# Patient Record
Sex: Male | Born: 1940 | Race: White | Hispanic: No | Marital: Married | Smoking: Never smoker
Health system: Southern US, Community
[De-identification: ages and names within clinical notes are randomized; demographics above are authoritative.]

## PROBLEM LIST (undated history)

## (undated) DIAGNOSIS — E119 Type 2 diabetes mellitus without complications: Secondary | ICD-10-CM

## (undated) DIAGNOSIS — R410 Disorientation, unspecified: Secondary | ICD-10-CM

## (undated) DIAGNOSIS — E782 Mixed hyperlipidemia: Secondary | ICD-10-CM

## (undated) DIAGNOSIS — I219 Acute myocardial infarction, unspecified: Secondary | ICD-10-CM

## (undated) DIAGNOSIS — Z79899 Other long term (current) drug therapy: Secondary | ICD-10-CM

## (undated) DIAGNOSIS — I10 Essential (primary) hypertension: Secondary | ICD-10-CM

## (undated) DIAGNOSIS — R6521 Severe sepsis with septic shock: Secondary | ICD-10-CM

## (undated) DIAGNOSIS — A419 Sepsis, unspecified organism: Secondary | ICD-10-CM

## (undated) DIAGNOSIS — I482 Chronic atrial fibrillation, unspecified: Secondary | ICD-10-CM

## (undated) DIAGNOSIS — I2510 Atherosclerotic heart disease of native coronary artery without angina pectoris: Secondary | ICD-10-CM

## (undated) DIAGNOSIS — E870 Hyperosmolality and hypernatremia: Secondary | ICD-10-CM

## (undated) HISTORY — DX: Acute myocardial infarction, unspecified: I21.9

## (undated) HISTORY — DX: Type 2 diabetes mellitus without complications: E11.9

## (undated) HISTORY — DX: Sepsis, unspecified organism: A41.9

## (undated) HISTORY — DX: Disorientation, unspecified: R41.0

## (undated) HISTORY — DX: Mixed hyperlipidemia: E78.2

## (undated) HISTORY — DX: Chronic atrial fibrillation, unspecified: I48.20

## (undated) HISTORY — DX: Other long term (current) drug therapy: Z79.899

## (undated) HISTORY — DX: Hyperosmolality and hypernatremia: E87.0

## (undated) HISTORY — DX: Severe sepsis with septic shock: R65.21

## (undated) HISTORY — DX: Atherosclerotic heart disease of native coronary artery without angina pectoris: I25.10

## (undated) HISTORY — DX: Essential (primary) hypertension: I10

---

## 1992-01-26 HISTORY — PX: CORONARY ARTERY BYPASS GRAFT: SHX141

## 2000-06-15 ENCOUNTER — Ambulatory Visit (HOSPITAL_COMMUNITY): Admission: RE | Admit: 2000-06-15 | Discharge: 2000-06-16 | Payer: Self-pay | Admitting: *Deleted

## 2000-06-15 ENCOUNTER — Encounter: Payer: Self-pay | Admitting: *Deleted

## 2001-09-13 ENCOUNTER — Encounter: Admission: RE | Admit: 2001-09-13 | Discharge: 2001-09-13 | Payer: Self-pay | Admitting: Infectious Diseases

## 2001-10-04 ENCOUNTER — Encounter: Admission: RE | Admit: 2001-10-04 | Discharge: 2001-10-04 | Payer: Self-pay | Admitting: Infectious Diseases

## 2003-07-26 ENCOUNTER — Inpatient Hospital Stay (HOSPITAL_COMMUNITY): Admission: EM | Admit: 2003-07-26 | Discharge: 2003-07-30 | Payer: Self-pay | Admitting: Emergency Medicine

## 2003-07-30 ENCOUNTER — Encounter: Payer: Self-pay | Admitting: Cardiology

## 2003-08-08 ENCOUNTER — Ambulatory Visit (HOSPITAL_COMMUNITY): Admission: RE | Admit: 2003-08-08 | Discharge: 2003-08-08 | Payer: Self-pay | Admitting: Infectious Diseases

## 2003-12-03 ENCOUNTER — Ambulatory Visit: Payer: Self-pay | Admitting: Cardiology

## 2003-12-17 ENCOUNTER — Ambulatory Visit: Payer: Self-pay | Admitting: Cardiology

## 2004-01-07 ENCOUNTER — Ambulatory Visit: Payer: Self-pay | Admitting: Cardiology

## 2004-02-04 ENCOUNTER — Ambulatory Visit: Payer: Self-pay | Admitting: Cardiology

## 2004-03-03 ENCOUNTER — Ambulatory Visit: Payer: Self-pay | Admitting: Cardiology

## 2004-03-24 ENCOUNTER — Ambulatory Visit: Payer: Self-pay | Admitting: *Deleted

## 2004-04-21 ENCOUNTER — Ambulatory Visit: Payer: Self-pay | Admitting: Cardiology

## 2004-05-19 ENCOUNTER — Ambulatory Visit: Payer: Self-pay | Admitting: *Deleted

## 2004-05-21 ENCOUNTER — Ambulatory Visit: Payer: Self-pay | Admitting: Cardiology

## 2004-05-25 ENCOUNTER — Ambulatory Visit: Payer: Self-pay

## 2004-06-02 ENCOUNTER — Ambulatory Visit: Payer: Self-pay | Admitting: Cardiology

## 2004-06-03 ENCOUNTER — Ambulatory Visit: Payer: Self-pay | Admitting: *Deleted

## 2004-06-03 ENCOUNTER — Ambulatory Visit (HOSPITAL_COMMUNITY): Admission: RE | Admit: 2004-06-03 | Discharge: 2004-06-05 | Payer: Self-pay | Admitting: *Deleted

## 2004-06-09 ENCOUNTER — Ambulatory Visit: Payer: Self-pay | Admitting: Cardiology

## 2004-06-18 ENCOUNTER — Ambulatory Visit: Payer: Self-pay | Admitting: Cardiology

## 2004-06-26 ENCOUNTER — Ambulatory Visit: Payer: Self-pay | Admitting: Cardiology

## 2004-07-10 ENCOUNTER — Ambulatory Visit: Payer: Self-pay | Admitting: Cardiology

## 2004-07-24 ENCOUNTER — Ambulatory Visit: Payer: Self-pay | Admitting: Cardiology

## 2004-08-21 ENCOUNTER — Ambulatory Visit: Payer: Self-pay | Admitting: Cardiology

## 2004-09-18 ENCOUNTER — Ambulatory Visit: Payer: Self-pay | Admitting: Internal Medicine

## 2004-10-09 ENCOUNTER — Ambulatory Visit: Payer: Self-pay | Admitting: Internal Medicine

## 2004-11-06 ENCOUNTER — Ambulatory Visit: Payer: Self-pay | Admitting: Internal Medicine

## 2004-12-04 ENCOUNTER — Ambulatory Visit: Payer: Self-pay | Admitting: Cardiology

## 2004-12-16 ENCOUNTER — Ambulatory Visit: Payer: Self-pay | Admitting: Cardiology

## 2004-12-25 ENCOUNTER — Ambulatory Visit: Payer: Self-pay | Admitting: Internal Medicine

## 2004-12-25 ENCOUNTER — Ambulatory Visit: Payer: Self-pay

## 2005-01-15 ENCOUNTER — Ambulatory Visit: Payer: Self-pay | Admitting: Cardiology

## 2005-02-12 ENCOUNTER — Ambulatory Visit: Payer: Self-pay | Admitting: Cardiology

## 2005-03-05 ENCOUNTER — Ambulatory Visit: Payer: Self-pay | Admitting: Cardiovascular Disease

## 2005-03-26 ENCOUNTER — Ambulatory Visit: Payer: Self-pay | Admitting: Cardiology

## 2005-04-16 ENCOUNTER — Ambulatory Visit: Payer: Self-pay | Admitting: Cardiology

## 2005-05-14 ENCOUNTER — Ambulatory Visit: Payer: Self-pay | Admitting: Internal Medicine

## 2005-05-28 ENCOUNTER — Ambulatory Visit: Payer: Self-pay | Admitting: Cardiology

## 2005-06-18 ENCOUNTER — Ambulatory Visit: Payer: Self-pay | Admitting: Cardiology

## 2005-07-09 ENCOUNTER — Ambulatory Visit: Payer: Self-pay | Admitting: Cardiology

## 2005-07-19 ENCOUNTER — Ambulatory Visit: Payer: Self-pay | Admitting: Cardiology

## 2005-08-02 ENCOUNTER — Ambulatory Visit: Payer: Self-pay | Admitting: Cardiology

## 2005-08-23 ENCOUNTER — Ambulatory Visit: Payer: Self-pay | Admitting: Cardiology

## 2005-08-24 ENCOUNTER — Ambulatory Visit: Payer: Self-pay | Admitting: Cardiology

## 2005-08-31 ENCOUNTER — Ambulatory Visit: Payer: Self-pay | Admitting: Cardiology

## 2005-08-31 ENCOUNTER — Encounter: Payer: Self-pay | Admitting: Cardiology

## 2005-08-31 ENCOUNTER — Ambulatory Visit: Payer: Self-pay

## 2005-09-20 ENCOUNTER — Ambulatory Visit: Payer: Self-pay | Admitting: Cardiology

## 2005-10-06 ENCOUNTER — Ambulatory Visit: Payer: Self-pay | Admitting: Cardiovascular Disease

## 2005-10-22 ENCOUNTER — Ambulatory Visit: Payer: Self-pay | Admitting: Cardiology

## 2005-11-05 ENCOUNTER — Ambulatory Visit: Payer: Self-pay | Admitting: Cardiology

## 2005-11-26 ENCOUNTER — Ambulatory Visit: Payer: Self-pay | Admitting: Cardiology

## 2005-12-24 ENCOUNTER — Ambulatory Visit: Payer: Self-pay | Admitting: Cardiology

## 2006-01-10 ENCOUNTER — Ambulatory Visit: Payer: Self-pay | Admitting: Internal Medicine

## 2006-01-31 ENCOUNTER — Ambulatory Visit: Payer: Self-pay | Admitting: Cardiology

## 2006-02-28 ENCOUNTER — Ambulatory Visit: Payer: Self-pay | Admitting: Cardiology

## 2006-03-08 ENCOUNTER — Ambulatory Visit: Payer: Self-pay | Admitting: Cardiology

## 2006-03-22 ENCOUNTER — Ambulatory Visit: Payer: Self-pay | Admitting: Cardiology

## 2006-04-19 ENCOUNTER — Ambulatory Visit: Payer: Self-pay | Admitting: Cardiology

## 2006-05-17 ENCOUNTER — Ambulatory Visit: Payer: Self-pay | Admitting: Cardiology

## 2006-06-14 ENCOUNTER — Ambulatory Visit: Payer: Self-pay | Admitting: Cardiology

## 2006-07-05 ENCOUNTER — Ambulatory Visit: Payer: Self-pay | Admitting: Cardiology

## 2006-07-26 ENCOUNTER — Ambulatory Visit: Payer: Self-pay | Admitting: Cardiology

## 2006-08-23 ENCOUNTER — Ambulatory Visit: Payer: Self-pay | Admitting: Cardiology

## 2006-09-13 ENCOUNTER — Ambulatory Visit: Payer: Self-pay | Admitting: Cardiology

## 2006-10-11 ENCOUNTER — Ambulatory Visit: Payer: Self-pay | Admitting: Cardiology

## 2006-11-08 ENCOUNTER — Ambulatory Visit: Payer: Self-pay | Admitting: Cardiovascular Disease

## 2006-12-02 ENCOUNTER — Ambulatory Visit: Payer: Self-pay | Admitting: Cardiology

## 2006-12-30 ENCOUNTER — Ambulatory Visit: Payer: Self-pay | Admitting: Cardiology

## 2007-01-27 ENCOUNTER — Ambulatory Visit: Payer: Self-pay | Admitting: Cardiovascular Disease

## 2007-03-01 ENCOUNTER — Ambulatory Visit: Payer: Self-pay | Admitting: Internal Medicine

## 2007-03-01 ENCOUNTER — Ambulatory Visit: Payer: Self-pay | Admitting: Cardiology

## 2007-03-10 ENCOUNTER — Ambulatory Visit: Payer: Self-pay

## 2007-03-15 ENCOUNTER — Ambulatory Visit: Payer: Self-pay | Admitting: Internal Medicine

## 2007-04-05 ENCOUNTER — Ambulatory Visit: Payer: Self-pay | Admitting: Cardiology

## 2007-05-03 ENCOUNTER — Ambulatory Visit: Payer: Self-pay | Admitting: Internal Medicine

## 2007-05-17 ENCOUNTER — Ambulatory Visit: Payer: Self-pay | Admitting: Cardiology

## 2007-06-14 ENCOUNTER — Ambulatory Visit: Payer: Self-pay | Admitting: Cardiovascular Disease

## 2007-07-12 ENCOUNTER — Ambulatory Visit: Payer: Self-pay | Admitting: Cardiology

## 2007-08-09 ENCOUNTER — Ambulatory Visit: Payer: Self-pay | Admitting: Cardiovascular Disease

## 2007-09-06 ENCOUNTER — Ambulatory Visit: Payer: Self-pay | Admitting: Cardiovascular Disease

## 2007-10-04 ENCOUNTER — Ambulatory Visit: Payer: Self-pay | Admitting: Cardiovascular Disease

## 2007-10-25 ENCOUNTER — Ambulatory Visit: Payer: Self-pay | Admitting: Cardiovascular Disease

## 2007-11-08 ENCOUNTER — Ambulatory Visit: Payer: Self-pay | Admitting: Internal Medicine

## 2007-11-24 ENCOUNTER — Ambulatory Visit: Payer: Self-pay | Admitting: Cardiology

## 2007-12-15 ENCOUNTER — Ambulatory Visit: Payer: Self-pay | Admitting: Cardiology

## 2008-01-05 ENCOUNTER — Ambulatory Visit: Payer: Self-pay | Admitting: Cardiology

## 2008-02-02 ENCOUNTER — Ambulatory Visit: Payer: Self-pay | Admitting: Cardiology

## 2008-02-28 ENCOUNTER — Ambulatory Visit: Payer: Self-pay | Admitting: Cardiology

## 2008-03-08 ENCOUNTER — Ambulatory Visit: Payer: Self-pay | Admitting: Internal Medicine

## 2008-03-20 ENCOUNTER — Ambulatory Visit: Payer: Self-pay | Admitting: Cardiology

## 2008-03-20 ENCOUNTER — Encounter: Payer: Self-pay | Admitting: Cardiology

## 2008-03-29 ENCOUNTER — Ambulatory Visit: Payer: Self-pay | Admitting: Internal Medicine

## 2008-04-25 ENCOUNTER — Ambulatory Visit: Payer: Self-pay | Admitting: Internal Medicine

## 2008-05-23 ENCOUNTER — Ambulatory Visit: Payer: Self-pay | Admitting: Cardiology

## 2008-06-20 ENCOUNTER — Ambulatory Visit: Payer: Self-pay | Admitting: Internal Medicine

## 2008-06-20 LAB — CONVERTED CEMR LAB: POC INR: 2.5

## 2008-06-25 ENCOUNTER — Encounter: Payer: Self-pay | Admitting: *Deleted

## 2008-07-18 ENCOUNTER — Ambulatory Visit: Payer: Self-pay | Admitting: Cardiology

## 2008-07-31 ENCOUNTER — Encounter: Payer: Self-pay | Admitting: *Deleted

## 2008-08-15 ENCOUNTER — Ambulatory Visit: Payer: Self-pay | Admitting: Cardiology

## 2008-08-15 LAB — CONVERTED CEMR LAB: Prothrombin Time: 16.4 s

## 2008-09-03 ENCOUNTER — Encounter: Payer: Self-pay | Admitting: Cardiology

## 2008-09-12 ENCOUNTER — Ambulatory Visit: Payer: Self-pay | Admitting: Cardiology

## 2008-10-10 ENCOUNTER — Ambulatory Visit: Payer: Self-pay | Admitting: Cardiology

## 2008-10-10 LAB — CONVERTED CEMR LAB: POC INR: 3.3

## 2008-10-31 ENCOUNTER — Ambulatory Visit: Payer: Self-pay | Admitting: Cardiology

## 2008-11-14 ENCOUNTER — Ambulatory Visit: Payer: Self-pay | Admitting: Cardiology

## 2008-11-14 LAB — CONVERTED CEMR LAB: POC INR: 2.6

## 2008-12-05 ENCOUNTER — Ambulatory Visit: Payer: Self-pay | Admitting: Cardiovascular Disease

## 2008-12-27 ENCOUNTER — Encounter (INDEPENDENT_AMBULATORY_CARE_PROVIDER_SITE_OTHER): Payer: Self-pay | Admitting: *Deleted

## 2009-01-02 ENCOUNTER — Ambulatory Visit: Payer: Self-pay | Admitting: Cardiology

## 2009-01-02 ENCOUNTER — Encounter (INDEPENDENT_AMBULATORY_CARE_PROVIDER_SITE_OTHER): Payer: Self-pay | Admitting: Cardiology

## 2009-01-02 LAB — CONVERTED CEMR LAB

## 2009-01-30 ENCOUNTER — Ambulatory Visit: Payer: Self-pay | Admitting: Internal Medicine

## 2009-02-27 ENCOUNTER — Ambulatory Visit: Payer: Self-pay | Admitting: Internal Medicine

## 2009-02-27 LAB — CONVERTED CEMR LAB: POC INR: 2.5

## 2009-03-20 ENCOUNTER — Encounter: Payer: Self-pay | Admitting: Cardiology

## 2009-03-21 DIAGNOSIS — I4891 Unspecified atrial fibrillation: Secondary | ICD-10-CM

## 2009-03-21 DIAGNOSIS — E785 Hyperlipidemia, unspecified: Secondary | ICD-10-CM

## 2009-03-21 DIAGNOSIS — I10 Essential (primary) hypertension: Secondary | ICD-10-CM

## 2009-03-21 DIAGNOSIS — E119 Type 2 diabetes mellitus without complications: Secondary | ICD-10-CM

## 2009-03-28 ENCOUNTER — Ambulatory Visit: Payer: Self-pay | Admitting: Cardiology

## 2009-03-28 ENCOUNTER — Encounter (INDEPENDENT_AMBULATORY_CARE_PROVIDER_SITE_OTHER): Payer: Self-pay | Admitting: Cardiology

## 2009-03-28 LAB — CONVERTED CEMR LAB: POC INR: 2

## 2009-04-25 ENCOUNTER — Ambulatory Visit: Payer: Self-pay | Admitting: Internal Medicine

## 2009-04-25 LAB — CONVERTED CEMR LAB: POC INR: 2

## 2009-05-22 ENCOUNTER — Ambulatory Visit: Payer: Self-pay | Admitting: Cardiology

## 2009-05-22 LAB — CONVERTED CEMR LAB: POC INR: 2.6

## 2009-06-19 ENCOUNTER — Ambulatory Visit: Payer: Self-pay | Admitting: Internal Medicine

## 2009-06-19 LAB — CONVERTED CEMR LAB: POC INR: 2.4

## 2009-07-17 ENCOUNTER — Ambulatory Visit: Payer: Self-pay | Admitting: Cardiology

## 2009-07-29 ENCOUNTER — Ambulatory Visit: Payer: Self-pay | Admitting: Internal Medicine

## 2009-07-31 ENCOUNTER — Inpatient Hospital Stay (HOSPITAL_COMMUNITY): Admission: EM | Admit: 2009-07-31 | Discharge: 2009-09-01 | Payer: Self-pay | Admitting: Emergency Medicine

## 2009-07-31 ENCOUNTER — Ambulatory Visit: Payer: Self-pay | Admitting: Internal Medicine

## 2009-07-31 ENCOUNTER — Ambulatory Visit: Payer: Self-pay | Admitting: Cardiology

## 2009-08-01 ENCOUNTER — Encounter: Payer: Self-pay | Admitting: Cardiology

## 2009-08-19 ENCOUNTER — Ambulatory Visit: Payer: Self-pay | Admitting: Physical Medicine & Rehabilitation

## 2009-09-05 ENCOUNTER — Encounter: Payer: Self-pay | Admitting: Internal Medicine

## 2009-09-12 DIAGNOSIS — R578 Other shock: Secondary | ICD-10-CM | POA: Insufficient documentation

## 2009-09-12 DIAGNOSIS — E870 Hyperosmolality and hypernatremia: Secondary | ICD-10-CM

## 2009-09-12 DIAGNOSIS — I252 Old myocardial infarction: Secondary | ICD-10-CM

## 2009-09-12 DIAGNOSIS — R404 Transient alteration of awareness: Secondary | ICD-10-CM

## 2009-09-23 ENCOUNTER — Ambulatory Visit: Payer: Self-pay | Admitting: Cardiology

## 2009-09-24 ENCOUNTER — Encounter: Payer: Self-pay | Admitting: Cardiology

## 2009-09-24 ENCOUNTER — Telehealth (INDEPENDENT_AMBULATORY_CARE_PROVIDER_SITE_OTHER): Payer: Self-pay | Admitting: *Deleted

## 2009-09-25 ENCOUNTER — Encounter: Payer: Self-pay | Admitting: Cardiology

## 2009-09-25 ENCOUNTER — Encounter (HOSPITAL_COMMUNITY): Admission: RE | Admit: 2009-09-25 | Discharge: 2009-10-22 | Payer: Self-pay | Admitting: Cardiology

## 2009-09-25 ENCOUNTER — Ambulatory Visit: Payer: Self-pay | Admitting: Cardiology

## 2009-09-25 ENCOUNTER — Ambulatory Visit: Payer: Self-pay

## 2009-09-26 ENCOUNTER — Encounter: Payer: Self-pay | Admitting: Cardiology

## 2009-10-31 ENCOUNTER — Encounter: Payer: Self-pay | Admitting: Cardiology

## 2009-11-04 ENCOUNTER — Encounter: Admission: RE | Admit: 2009-11-04 | Discharge: 2009-11-04 | Payer: Self-pay | Admitting: Surgery

## 2009-11-06 ENCOUNTER — Ambulatory Visit: Payer: Self-pay | Admitting: Cardiology

## 2009-12-03 ENCOUNTER — Telehealth (INDEPENDENT_AMBULATORY_CARE_PROVIDER_SITE_OTHER): Payer: Self-pay | Admitting: *Deleted

## 2009-12-05 ENCOUNTER — Ambulatory Visit (HOSPITAL_COMMUNITY): Admission: RE | Admit: 2009-12-05 | Discharge: 2009-12-06 | Payer: Self-pay | Admitting: Surgery

## 2009-12-05 ENCOUNTER — Encounter (INDEPENDENT_AMBULATORY_CARE_PROVIDER_SITE_OTHER): Payer: Self-pay | Admitting: Surgery

## 2009-12-12 ENCOUNTER — Ambulatory Visit: Payer: Self-pay | Admitting: Cardiology

## 2009-12-19 ENCOUNTER — Ambulatory Visit: Payer: Self-pay | Admitting: Cardiology

## 2009-12-19 LAB — CONVERTED CEMR LAB: POC INR: 2.9

## 2009-12-26 ENCOUNTER — Encounter: Payer: Self-pay | Admitting: Cardiology

## 2010-01-09 ENCOUNTER — Ambulatory Visit: Payer: Self-pay | Admitting: Internal Medicine

## 2010-02-09 ENCOUNTER — Encounter: Payer: Self-pay | Admitting: Cardiology

## 2010-02-09 ENCOUNTER — Ambulatory Visit: Admission: RE | Admit: 2010-02-09 | Discharge: 2010-02-09 | Payer: Self-pay | Source: Home / Self Care

## 2010-02-09 ENCOUNTER — Ambulatory Visit
Admission: RE | Admit: 2010-02-09 | Discharge: 2010-02-09 | Payer: Self-pay | Source: Home / Self Care | Attending: Cardiology | Admitting: Cardiology

## 2010-02-09 DIAGNOSIS — R609 Edema, unspecified: Secondary | ICD-10-CM | POA: Insufficient documentation

## 2010-02-26 NOTE — Medication Information (Signed)
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 Cloyde Reams RN  September 05, 2009 10:30 AM   Allergies: 1)  ! Lipitor  Anticoagulation Management History:      Positive risk factors for bleeding include an age of 72 years or older and presence of serious comorbidities.  The bleeding index is 'intermediate risk'.  Positive CHADS2 values include History of HTN and History of Diabetes.  Negative CHADS2 values include Age > 48 years old.  The start date was 05/28/1997.  Anticoagulation responsible provider: Tenny Craw MD, Gunnar Fusi.  Exp: 09/2010.    Anticoagulation Management Assessment/Plan:      The patient's current anticoagulation dose is Warfarin sodium 5 mg tabs: Use as directed by Anticoagulation Clinic, Warfarin sodium 7.5 mg tabs: Use as directed by Anticoagulation Clinic.  The target INR is 2 - 3.  The next INR is due 08/14/2009.  Anticoagulation instructions were given to patient.  Results were reviewed/authorized by Inactive.         Prior Anticoagulation Instructions: INR 2.4  Continue on same dosage 7.5mg  daily except 5mg  on Wednesdays.  Recheck in 4 weeks.

## 2010-02-26 NOTE — Letter (Signed)
Summary: [facility]   Imported By: Marylou Mccoy 10/24/2009 10:53:35  _____________________________________________________________________  External Attachment:    Type:   Image     Comment:   External Document

## 2010-02-26 NOTE — Miscellaneous (Signed)
  Clinical Lists Changes  Medications: Removed medication of WARFARIN SODIUM 1 MG TABS (WARFARIN SODIUM) Use as directed by Anticoagualtion Clinic Changed medication from COUMADIN 5 MG TABS (WARFARIN SODIUM) Sunday - none, Monday - none, Tuesday - none, Wednesday - 1 tab, Thursday - none, Friday - none, Saturday - none to WARFARIN SODIUM 5 MG TABS (WARFARIN SODIUM) Use as directed by Anticoagulation Clinic Changed medication from COUMADIN 7.5 MG TABS (WARFARIN SODIUM) Sunday - 1 tab, Monday - 1 tab, Tuesday - 1 tab, Wednesday - none, Thursday - 1 tab, Friday - 1 tab, Saturday - 1 tab to WARFARIN SODIUM 7.5 MG TABS (WARFARIN SODIUM) Use as directed by Anticoagulation Clinic

## 2010-02-26 NOTE — Letter (Signed)
Summary: Clearance Letter  [facility], Main Office  [address]   [place], [place] [address]   Phone: [phone]  Fax: [phone]    September 26, 2009  Re:     Jordan Clayton Address:   [address]     [place], [place]  [address] DOB:     1940/08/19 MRN:     [id number]   Dear Dr. Ezzard Standing  [name] is low risk cardiac wise for surgery. If you have any further questions please feel free to contact us at [phone].        Sincerely,     Dr. Valera Castle Lisabeth Devoid RN

## 2010-02-26 NOTE — Assessment & Plan Note (Signed)
Summary: CAD/ANAS  Medications Added FUROSEMIDE 40 MG TABS (FUROSEMIDE) 1 tab as needed        Visit Type:  rov Primary Provider:  Windle Guard  CC:  edema/right leg..denies any sob or cp.  History of Present Illness: [name] returns today for evaluation and management of his history of coronary artery disease, bypass surgery, chronic atrial fibrillation managed with rate control and anticoagulation, and hypertension.  He's had a good year and is currently having no symptoms of facial fibrillation. He's had no angina or ischemic symptoms. His chronic lower extremity edema, particularly of his right lower extremity, is under control and stable. He constantly wears a support stocking.  Current Medications (verified): 1)  Quinapril Hcl 20 Mg  Tabs (Quinapril Hcl) .Marland Kitchen.. 1 By Mouth Daily 2)  Diltiazem Hcl Er Beads 180 Mg Xr24h-Cap (Diltiazem Hcl Er Beads) .... Take One Capsule By Mouth Twice A Day 3)  Pravastatin Sodium 10 Mg Tabs (Pravastatin Sodium) .... Take One Tablet By Mouth Daily At Bedtime 4)  Niacin Cr 1000 Mg Cr Tab (Niacin) .... Take 1 Tablet By Mouth Twice Daily 5)  Aspirin 81 Mg  Tbec (Aspirin) .... One By Mouth Every Day 6)  Calcium Citrate-Vitamin D 315-200 Mg-Unit  Tabs (Calcium Citrate-Vitamin D) .Marland Kitchen.. 1 Tab Once Daily 7)  Co Q-10 120 Mg Caps (Coenzyme Q10) .Marland Kitchen.. 1 Tab Once Daily 8)  Vitamin C 500 Mg  Tabs (Ascorbic Acid) .Marland Kitchen.. 1 Tab Once Daily 9)  Vitamin E 600 Unit  Caps (Vitamin E) .Marland Kitchen.. 1 Cap Once Daily 10)  Foltx 2.5-25-2 Mg Tabs (Fa-Pyridoxine-Cyancobalamin) .Marland Kitchen.. 1 Tab Once Daily 11)  Furosemide 40 Mg Tabs (Furosemide) .Marland Kitchen.. 1 Tab As Needed 12)  Nitroglycerin 0.4 Mg Subl (Nitroglycerin) .... One Tablet Under Tongue Every 5 Minutes As Needed For Chest Pain---May Repeat Times Three 13)  Metoprolol Tartrate 25 Mg Tabs (Metoprolol Tartrate) .Marland Kitchen.. 1 Tab Two Times A Day 14)  Warfarin Sodium 5 Mg Tabs (Warfarin Sodium) .... Use As Directed By Anticoagulation Clinic 15)   Warfarin Sodium 7.5 Mg Tabs (Warfarin Sodium) .... Use As Directed By Anticoagulation Clinic  Allergies: 1)  ! Lipitor  Past History:  Past Surgical History: Last updated: 03/21/2009 CABG..1993  Review of Systems       negative other than history of present illness  Vital Signs:  Patient profile:   70 year old male Height:      71 inches Weight:      226 pounds BMI:     31.63 Pulse rate:   66 / minute Pulse rhythm:   irregular BP sitting:   120 / 70  (left arm) Cuff size:   large  Vitals Entered By: Danielle Rankin, CMA (March 28, 2009 10:09 AM)  Physical Exam  General:  Well developed, well nourished, in no acute distress. Head:  normocephalic and atraumatic Eyes:  PERRLA/EOM intact; conjunctiva and lids normal. Neck:  Neck supple, no JVD. No masses, thyromegaly or abnormal cervical nodes. Chest Saksham Akkerman:  no deformities or breast masses noted Lungs:  Clear bilaterally to auscultation and percussion. Heart:  irregular rate and rhythm, PMI nondisplaced. No carotid bruit Abdomen:  Bowel sounds positive; abdomen soft and non-tender without masses, organomegaly, or hernias noted. No hepatosplenomegaly. Msk:  decreased ROM.   Pulses:  difficult to feel the right lower extremity because of edema. Otherwise unremarkable Extremities:  1+ left pedal edema and 2+ right pedal edema.   Neurologic:  Alert and oriented x 3. Skin:  Intact without  lesions or rashes. Psych:  Normal affect.   EKG  Procedure date:  03/28/2009  Findings:      atrial fibrillation, nonspecific changes, no change  Impression & Recommendations:  Problem # 1:  CAD, ARTERY BYPASS GRAFT/ 1993 (ICD-414.04) Assessment Unchanged  His updated medication list for this problem includes:    Quinapril Hcl 20 Mg Tabs (Quinapril hcl) .Marland Kitchen... 1 by mouth daily    Diltiazem Hcl Er Beads 180 Mg Xr24h-cap (Diltiazem hcl er beads) .Marland Kitchen... Take one capsule by mouth twice a day    Aspirin 81 Mg Tbec (Aspirin) ..... One by mouth  every day    Nitroglycerin 0.4 Mg Subl (Nitroglycerin) ..... One tablet under tongue every 5 minutes as needed for chest pain---may repeat times three    Metoprolol Tartrate 25 Mg Tabs (Metoprolol tartrate) .Marland Kitchen... 1 tab two times a day    Warfarin Sodium 5 Mg Tabs (Warfarin sodium) ..... Use as directed by anticoagulation clinic    Warfarin Sodium 7.5 Mg Tabs (Warfarin sodium) ..... Use as directed by anticoagulation clinic  Orders: EKG w/ Interpretation (93000)  His updated medication list for this problem includes:    Quinapril Hcl 20 Mg Tabs (Quinapril hcl) .Marland Kitchen... 1 by mouth daily    Diltiazem Hcl Er Beads 180 Mg Xr24h-cap (Diltiazem hcl er beads) .Marland Kitchen... Take one capsule by mouth twice a day    Aspirin 81 Mg Tbec (Aspirin) ..... One by mouth every day    Nitroglycerin 0.4 Mg Subl (Nitroglycerin) ..... One tablet under tongue every 5 minutes as needed for chest pain---may repeat times three    Metoprolol Tartrate 25 Mg Tabs (Metoprolol tartrate) .Marland Kitchen... 1 tab two times a day    Warfarin Sodium 5 Mg Tabs (Warfarin sodium) ..... Use as directed by anticoagulation clinic    Warfarin Sodium 7.5 Mg Tabs (Warfarin sodium) ..... Use as directed by anticoagulation clinic  Problem # 2:  ATRIAL FIBRILLATION, CHRONIC (ICD-427.31) Assessment: Unchanged  His updated medication list for this problem includes:    Aspirin 81 Mg Tbec (Aspirin) ..... One by mouth every day    Metoprolol Tartrate 25 Mg Tabs (Metoprolol tartrate) .Marland Kitchen... 1 tab two times a day    Warfarin Sodium 5 Mg Tabs (Warfarin sodium) ..... Use as directed by anticoagulation clinic    Warfarin Sodium 7.5 Mg Tabs (Warfarin sodium) ..... Use as directed by anticoagulation clinic  Orders: EKG w/ Interpretation (93000)  His updated medication list for this problem includes:    Aspirin 81 Mg Tbec (Aspirin) ..... One by mouth every day    Metoprolol Tartrate 25 Mg Tabs (Metoprolol tartrate) .Marland Kitchen... 1 tab two times a day    Warfarin Sodium 5 Mg  Tabs (Warfarin sodium) ..... Use as directed by anticoagulation clinic    Warfarin Sodium 7.5 Mg Tabs (Warfarin sodium) ..... Use as directed by anticoagulation clinic  Problem # 3:  COUMADIN THERAPY (ICD-V58.61) Assessment: Unchanged  Problem # 4:  HYPERTENSION (ICD-401.9) Assessment: Improved  His updated medication list for this problem includes:    Quinapril Hcl 20 Mg Tabs (Quinapril hcl) .Marland Kitchen... 1 by mouth daily    Diltiazem Hcl Er Beads 180 Mg Xr24h-cap (Diltiazem hcl er beads) .Marland Kitchen... Take one capsule by mouth twice a day    Aspirin 81 Mg Tbec (Aspirin) ..... One by mouth every day    Furosemide 40 Mg Tabs (Furosemide) .Marland Kitchen... 1 tab as needed    Metoprolol Tartrate 25 Mg Tabs (Metoprolol tartrate) .Marland Kitchen... 1 tab two times a day  His updated medication list for this problem includes:    Quinapril Hcl 20 Mg Tabs (Quinapril hcl) .Marland Kitchen... 1 by mouth daily    Diltiazem Hcl Er Beads 180 Mg Xr24h-cap (Diltiazem hcl er beads) .Marland Kitchen... Take one capsule by mouth twice a day    Aspirin 81 Mg Tbec (Aspirin) ..... One by mouth every day    Furosemide 40 Mg Tabs (Furosemide) .Marland Kitchen... 1 tab as needed    Metoprolol Tartrate 25 Mg Tabs (Metoprolol tartrate) .Marland Kitchen... 1 tab two times a day  Patient Instructions: 1)  Your physician recommends that you schedule a follow-up appointment in 6 MONTHS WITH DR Jeannetta Cerutti 2)  Your physician recommends that you continue on your current medications as directed. Please refer to the Current Medication list given to you today.

## 2010-02-26 NOTE — Medication Information (Signed)
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 no    Recent/future dental: no  Any missed doses?: no       Is patient compliant with meds? yes       Allergies: 1)  ! Lipitor 2)  ! Crestor 3)  ! Percocet 4)  ! * Seroquel  Anticoagulation Management History:      The patient is taking warfarin and comes in today for a routine follow up visit.  Positive risk factors for bleeding include an age of 72 years or older and presence of serious comorbidities.  The bleeding index is 'intermediate risk'.  Positive CHADS2 values include History of HTN and History of Diabetes.  Negative CHADS2 values include Age > 28 years old.  The start date was 05/28/1997.  Anticoagulation responsible provider: Bensimhon MD, Reuel Boom.  INR POC: 3.3.  Cuvette Lot#: [id number].  Exp: 02/2011.    Anticoagulation Management Assessment/Plan:      The patient's current anticoagulation dose is Warfarin sodium 7.5 mg tabs: take as directed by anticoagulation clinic, Warfarin sodium 5 mg tabs: take as directed by anticoagulation clinic.  The target INR is 2 - 3.  The next INR is due 03/04/2010.  Anticoagulation instructions were given to patient.  Results were reviewed/authorized by Weston Brass, PharmD.  He was notified by Linward Headland, PharmD candidate.         Prior Anticoagulation Instructions: INR 2.9  Continue same dose of 7.5mg  daily except 5mg  on Wednesday.  Recheck INR in 4 weeks.   Current Anticoagulation Instructions: INR 3.3 (INR goal: 2-3)  Hold today's  dose.  Take 7.5 mg everyday except 5 mg on Wednesdays.  Recheck in 3 weeks.

## 2010-02-26 NOTE — Medication Information (Signed)
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  Health status changes: no    Bleeding/hemorrhagic complications: no    Recent/future hospitalizations: no    Any changes in medication regimen? no    Recent/future dental: no  Any missed doses?: no       Is patient compliant with meds? yes       Allergies (verified): 1)  ! Lipitor  Anticoagulation Management History:      The patient is taking warfarin and comes in today for a routine follow up visit.  Positive risk factors for bleeding include an age of 70 years or older and presence of serious comorbidities.  The bleeding index is 'intermediate risk'.  Positive CHADS2 values include History of HTN and History of Diabetes.  Negative CHADS2 values include Age > 58 years old.  The start date was 05/28/1997.  Anticoagulation responsible provider: Juanda Chance MD, Smitty Cords.  INR POC: 2.0.  Cuvette Lot#: [id number].  Exp: 05/2010.    Anticoagulation Management Assessment/Plan:      The patient's current anticoagulation dose is Warfarin sodium 5 mg tabs: Use as directed by Anticoagulation Clinic, Warfarin sodium 7.5 mg tabs: Use as directed by Anticoagulation Clinic.  The target INR is 2 - 3.  The next INR is due 04/25/2009.  Anticoagulation instructions were given to patient.  Results were reviewed/authorized by Shelby Dubin, PharmD, BCPS, CPP.  He was notified by Shelby Dubin PharmD, BCPS, CPP.         Prior Anticoagulation Instructions: INR 2.5 Continue 7.5mg s everyday except 5mg s on Wednesdays. Recheck in 4 weeks.   Current Anticoagulation Instructions: INR 2.0  Extra 5 mg today only, then resume 7.5  mg daily except 5 mg each Wednesday.  Recheck in 4 weeks.

## 2010-02-26 NOTE — Medication Information (Signed)
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  Had nasal stuffiness  Bleeding/hemorrhagic complications: no    Recent/future hospitalizations: no    Any changes in medication regimen? yes       Details: Amoxicillin 1000mg s Q8hrs started last Thursday for 10 days. Had one injection of Cortisone in shoulder in office.   Recent/future dental: no  Any missed doses?: no       Is patient compliant with meds? yes       Allergies: 1)  ! Lipitor  Anticoagulation Management History:      The patient is taking warfarin and comes in today for a routine follow up visit.  Positive risk factors for bleeding include an age of 70 years or older and presence of serious comorbidities.  The bleeding index is 'intermediate risk'.  Positive CHADS2 values include History of HTN and History of Diabetes.  Negative CHADS2 values include Age > 72 years old.  The start date was 05/28/1997.  Anticoagulation responsible provider: Myrtis Ser MD, Tinnie Gens.  INR POC: 2.6.  Cuvette Lot#: [id number].  Exp: 06/2010.    Anticoagulation Management Assessment/Plan:      The patient's current anticoagulation dose is Warfarin sodium 5 mg tabs: Use as directed by Anticoagulation Clinic, Warfarin sodium 7.5 mg tabs: Use as directed by Anticoagulation Clinic.  The target INR is 2 - 3.  The next INR is due 06/19/2009.  Anticoagulation instructions were given to patient.  Results were reviewed/authorized by Bethena Midget, RN, BSN.  He was notified by Bethena Midget, RN, BSN.         Prior Anticoagulation Instructions: INR 2.0  Continue same dose of 7.5mg  every day  except 5mg  on Wednesday   Current Anticoagulation Instructions: INR 2.6 Continue 7.5mg s daily except 5mg s on Wednesdays. Recheck in 4 weeks.

## 2010-02-26 NOTE — Assessment & Plan Note (Signed)
Summary: per check out/sf   Visit Type:  rov Primary Provider:  Windle Guard  CC:  edema/right leg...denies any other complaints today.  History of Present Illness: Jordan Clayton comes in today for followup and management of his coronary artery disease, history of an inferior Corrigan Kretschmer myocardial infarction, and chronic atrial ventilation.  Other than his chronic right lower extremity edema from previous damage that leg for infection, he has no complaints. He was finally able to get his gallbladder removed in his percutaneous drain removed. A stress test on September 25, 2009 showed ejection fraction of 45% old inferior Jordan Clayton infarct, no ischemia. We cleared him for surgery.  He denies palpitations, presyncope or syncope. He remains on Coumadin. He is not back on aspirin.  Current Medications (verified): 1)  Nitroglycerin 0.4 Mg Subl (Nitroglycerin) .... One Tablet Under Tongue Every 5 Minutes As Needed For Chest Pain---May Repeat Times Three 2)  Metoprolol Tartrate 100 Mg Tabs (Metoprolol Tartrate) .... Take 1 Tablet Twice A Day 3)  Pravastatin Sodium 20 Mg Tabs (Pravastatin Sodium) .Marland Kitchen.. 1 Tab Once Daily 4)  Folic Acid 800 Mcg Tabs (Folic Acid) .Marland Kitchen.. 1 Tab Once Daily 5)  Vitamin B-1 100 Mg Tabs (Thiamine Hcl) .... Take 1 Tablet Daily 6)  Multivitamins  Tabs (Multiple Vitamin) .... Take 1 Tablet Daily 7)  Boost High Protein  Liqd (Nutritional Supplements) .Marland Kitchen.. 1 Drink Per Once Daily 8)  Vitamin B-12 250 Mcg Tabs (Cyanocobalamin) .Marland Kitchen.. 1 Tab Once Daily 9)  Warfarin Sodium 7.5 Mg Tabs (Warfarin Sodium) .... Take As Directed By Anticoagulation Clinic 10)  Warfarin Sodium 5 Mg Tabs (Warfarin Sodium) .... Take As Directed By Anticoagulation Clinic 11)  Metformin Hcl 1000 Mg Tabs (Metformin Hcl) .... Take 1 Tablet By Mouth Two Times A Day  Allergies: 1)  ! Lipitor 2)  ! Crestor 3)  ! Percocet 4)  ! * Seroquel  Past History:  Past Medical History: Last updated: 09/12/2009 DELIRIUM  (ICD-780.09) HYPERNATREMIA (ICD-276.0) SEPTIC SHOCK (ICD-785.59) MYOCARDIAL INFARCTION, ACUTE, SUBENDOCARDIAL (ICD-410.70) CAD, ARTERY BYPASS GRAFT/ 1993 (ICD-414.04) ATRIAL FIBRILLATION, CHRONIC (ICD-427.31) COUMADIN THERAPY (ICD-V58.61) HYPERLIPIDEMIA-MIXED (ICD-272.4) HYPERTENSION (ICD-401.9) DIABETES MELLITUS, TYPE II (ICD-250.00)      Past Surgical History: Last updated: 03/21/2009 CABG..1993  Family History: Last updated: 03/21/2009 Family History of Coronary Artery Disease:  Family History of Hypertension:   Social History: Last updated: 03/21/2009 Disabled  Married  Tobacco Use - Yes.  Alcohol Use - no Regular Exercise - no Retired   Risk Factors: Exercise: no (03/20/2008)  Risk Factors: Smoking Status: current (03/20/2008)  Clinical Reports Reviewed:  Nuclear Study:  09/25/2009:  Excerise capacity: Lexiscan with no exercise  Blood Pressure response: Normal blood pressure response  Clinical symptoms: No chest pain  ECG impression:No significant ST segment change suggestive of ischemia; patient in atrial fibrillation during the study  Overall impression: Abnormal lexiscan nuclear study with prior infarct; no ischemia.  Olga Millers, MD, [facility]   03/10/2007:  Excerise capacity: Adenosine with low level exercise  Blood Pressure response: Normal blood pressure repsonse  Clinical symptoms: Mild chest pain/dyspnea  ECG impression: No significant ST segement change suggestive of ischemia  Overall impression: Low risk abnormal adenosine stress nuclear study as noted above.  Jonelle Sidle, MD   Review of Systems       negative other than history of present illness  Vital Signs:  Patient profile:   70 year old male Height:      71 inches Weight:      214.50 pounds BMI:  30.02 Pulse rate:   90 / minute Pulse rhythm:   irregular BP sitting:   126 / 80  (left arm) Cuff size:   small  Vitals Entered By: Danielle Rankin, CMA (February 09, 2010 10:25 AM)  Physical Exam  General:  obese.  no acute distressobese.   Head:  normocephalic and atraumatic Eyes:  PERRLA/EOM intact; conjunctiva and lids normal. Mouth:  poor dentition.  poor dentition.   Neck:  Neck supple, no JVD. No masses, thyromegaly or abnormal cervical nodes. Lungs:  Clear bilaterally to auscultation and percussion. Heart:  PMI nondisplaced, regular rate and rhythm, variable S1-S2, no obvious carotid bruits Abdomen:  soft, nondistended, good bowel sounds Msk:  decreased ROM.  decreased ROM.   Pulses:  pulses normal in all 4 extremities Extremities:  right lower extremity with 2+ chronic edematous changes. No sign of DVT Neurologic:  Alert and oriented x 3. Skin:  Intact without lesions or rashes. Psych:  Normal affect.   Problems:  Medical Problems Added: 1)  Dx of Edema  (ICD-782.3)  Impression & Recommendations:  Problem # 1:  CAD, ARTERY BYPASS GRAFT/ 1993 (ICD-414.04) Assessment Unchanged Will restart aspirin 81 mg a day. The following medications were removed from the medication list:    Aspirin Ec 325 Mg Tbec (Aspirin) .Marland Kitchen... Take one tablet by mouth daily His updated medication list for this problem includes:    Nitroglycerin 0.4 Mg Subl (Nitroglycerin) ..... One tablet under tongue every 5 minutes as needed for chest pain---may repeat times three    Metoprolol Tartrate 100 Mg Tabs (Metoprolol tartrate) .Marland Kitchen... Take 1 tablet twice a day    Warfarin Sodium 7.5 Mg Tabs (Warfarin sodium) .Marland Kitchen... Take as directed by anticoagulation clinic    Warfarin Sodium 5 Mg Tabs (Warfarin sodium) .Marland Kitchen... Take as directed by anticoagulation clinic    Aspirin 81 Mg Tbec (Aspirin) .Marland Kitchen... Take one tablet by mouth daily  The following medications were removed from the medication list:    Aspirin Ec 325 Mg Tbec (Aspirin) .Marland Kitchen... Take one tablet by mouth daily His updated medication list for this problem includes:    Nitroglycerin 0.4 Mg Subl (Nitroglycerin) ..... One  tablet under tongue every 5 minutes as needed for chest pain---may repeat times three    Metoprolol Tartrate 100 Mg Tabs (Metoprolol tartrate) .Marland Kitchen... Take 1 tablet twice a day    Warfarin Sodium 7.5 Mg Tabs (Warfarin sodium) .Marland Kitchen... Take as directed by anticoagulation clinic    Warfarin Sodium 5 Mg Tabs (Warfarin sodium) .Marland Kitchen... Take as directed by anticoagulation clinic    Aspirin 81 Mg Tbec (Aspirin) .Marland Kitchen... Take one tablet by mouth daily  Problem # 2:  ATRIAL FIBRILLATION, CHRONIC (ICD-427.31) Assessment: Unchanged  The following medications were removed from the medication list:    Aspirin Ec 325 Mg Tbec (Aspirin) .Marland Kitchen... Take one tablet by mouth daily His updated medication list for this problem includes:    Metoprolol Tartrate 100 Mg Tabs (Metoprolol tartrate) .Marland Kitchen... Take 1 tablet twice a day    Warfarin Sodium 7.5 Mg Tabs (Warfarin sodium) .Marland Kitchen... Take as directed by anticoagulation clinic    Warfarin Sodium 5 Mg Tabs (Warfarin sodium) .Marland Kitchen... Take as directed by anticoagulation clinic    Aspirin 81 Mg Tbec (Aspirin) .Marland Kitchen... Take one tablet by mouth daily  Problem # 3:  COUMADIN THERAPY (ICD-V58.61) Assessment: Unchanged  Problem # 4:  EDEMA (ICD-782.3) Assessment: Unchanged Elevation and compression stockings recommended.  Other Orders: EKG w/ Interpretation (93000)  Patient Instructions: 1)  Your physician recommends that you schedule a follow-up appointment in: 6 months 2)  Your physician recommends that you continue on your current medications as directed. Please refer to the Current Medication list given to you today.  Appended Document: per check out/sf    Clinical Lists Changes  Observations: Added new observation of PI CARDIO: Your physician recommends that you schedule a follow-up appointment in: 6 months Your physician has recommended you make the following change in your medication: Start aspirin 81 mg by mouth daily (02/09/2010 10:43)       Patient Instructions: 1)   Your physician recommends that you schedule a follow-up appointment in: 6 months 2)  Your physician has recommended you make the following change in your medication: Start aspirin 81 mg by mouth daily

## 2010-02-26 NOTE — Medication Information (Signed)
Summary: rov/sp  Medications Added METFORMIN HCL 1000 MG TABS (METFORMIN HCL) Take 1 tablet by mouth two times a day       Anticoagulant Therapy  Managed by: Weston Brass, PharmD Referring MD: Valera Castle MD PCP: Windle Guard Supervising MD: Gala Romney MD, Reuel Boom Indication 1: Atrial Fibrillation (ICD-427.31) Indication 2: Coronary Artery Bypass Graft (ICD-V45.81) Lab Used: [facility] Site: [facility] INR POC 2.9 INR RANGE 2 - 3  Dietary changes: no    Health status changes: no    Bleeding/hemorrhagic complications: no    Recent/future hospitalizations: no    Any changes in medication regimen? no    Recent/future dental: no  Any missed doses?: no       Is patient compliant with meds? yes       Current Medications (verified): 1)  Aspirin Ec 325 Mg Tbec (Aspirin) .... Take One Tablet By Mouth Daily 2)  Nitroglycerin 0.4 Mg Subl (Nitroglycerin) .... One Tablet Under Tongue Every 5 Minutes As Needed For Chest Pain---May Repeat Times Three 3)  Metoprolol Tartrate 100 Mg Tabs (Metoprolol Tartrate) .... Take 1 Tablet Twice A Day 4)  Pravastatin Sodium 20 Mg Tabs (Pravastatin Sodium) .Marland Kitchen.. 1 Tab Once Daily 5)  Folic Acid 800 Mcg Tabs (Folic Acid) .Marland Kitchen.. 1 Tab Once Daily 6)  Vitamin B-1 100 Mg Tabs (Thiamine Hcl) .... Take 1 Tablet Daily 7)  Multivitamins  Tabs (Multiple Vitamin) .... Take 1 Tablet Daily 8)  Boost High Protein  Liqd (Nutritional Supplements) .Marland Kitchen.. 1 Drink Per Once Daily 9)  Protonix 40 Mg Tbec (Pantoprazole Sodium) .Marland Kitchen.. 1 Tab Once Daily 10)  Vitamin B-12 250 Mcg Tabs (Cyanocobalamin) .Marland Kitchen.. 1 Tab Once Daily 11)  Warfarin Sodium 7.5 Mg Tabs (Warfarin Sodium) .... Take As Directed By Anticoagulation Clinic 12)  Warfarin Sodium 5 Mg Tabs (Warfarin Sodium) .... Take As Directed By Anticoagulation Clinic 13)  Metformin Hcl 1000 Mg Tabs (Metformin Hcl) .... Take 1 Tablet By Mouth Two Times A Day  Allergies: 1)  ! Lipitor 2)  ! Crestor 3)  ! Percocet 4)  ! *  Seroquel  Anticoagulation Management History:      The patient is taking warfarin and comes in today for a routine follow up visit.  Positive risk factors for bleeding include an age of 31 years or older and presence of serious comorbidities.  The bleeding index is 'intermediate risk'.  Positive CHADS2 values include History of HTN and History of Diabetes.  Negative CHADS2 values include Age > 48 years old.  The start date was 05/28/1997.  Anticoagulation responsible provider: Bensimhon MD, Reuel Boom.  INR POC: 2.9.  Cuvette Lot#: [id number].  Exp: 02/2011.    Anticoagulation Management Assessment/Plan:      The patient's current anticoagulation dose is Warfarin sodium 7.5 mg tabs: take as directed by anticoagulation clinic, Warfarin sodium 5 mg tabs: take as directed by anticoagulation clinic.  The target INR is 2 - 3.  The next INR is due 02/09/2010.  Anticoagulation instructions were given to patient.  Results were reviewed/authorized by Weston Brass, PharmD.  He was notified by Weston Brass PharmD.         Prior Anticoagulation Instructions: INR 2.9  Continue same dose of 7.5mg  daily except 5mg  on Wednesday.  Recheck INR in 3 weeks.   Current Anticoagulation Instructions: INR 2.9  Continue same dose of 7.5mg  daily except 5mg  on Wednesday.  Recheck INR in 4 weeks.

## 2010-02-26 NOTE — Medication Information (Signed)
Summary: rov/mw   Anticoagulant Therapy  Managed by: Weston Brass, PharmD Referring MD: Valera Castle MD PCP: Windle Guard Supervising MD: Myrtis Ser MD, Tinnie Gens Indication 1: Atrial Fibrillation (ICD-427.31) Indication 2: Coronary Artery Bypass Graft (ICD-V45.81) Lab Used: [facility] Site: [facility] INR POC 2.9 INR RANGE 2 - 3  Dietary changes: no    Health status changes: no    Bleeding/hemorrhagic complications: no    Recent/future hospitalizations: no    Any changes in medication regimen? yes       Details: taking cephalexin for skin infection  Recent/future dental: no  Any missed doses?: no       Is patient compliant with meds? yes       Allergies: 1)  ! Lipitor 2)  ! Crestor 3)  ! Percocet 4)  ! * Seroquel  Anticoagulation Management History:      The patient is taking warfarin and comes in today for a routine follow up visit.  Positive risk factors for bleeding include an age of 72 years or older and presence of serious comorbidities.  The bleeding index is 'intermediate risk'.  Positive CHADS2 values include History of HTN and History of Diabetes.  Negative CHADS2 values include Age > 31 years old.  The start date was 05/28/1997.  Anticoagulation responsible provider: Myrtis Ser MD, Tinnie Gens.  INR POC: 2.9.  Cuvette Lot#: [id number].  Exp: 12/2010.    Anticoagulation Management Assessment/Plan:      The patient's current anticoagulation dose is Warfarin sodium 7.5 mg tabs: take as directed by anticoagulation clinic, Warfarin sodium 5 mg tabs: take as directed by anticoagulation clinic.  The target INR is 2 - 3.  The next INR is due 01/09/2010.  Anticoagulation instructions were given to patient.  Results were reviewed/authorized by Weston Brass, PharmD.  He was notified by Weston Brass PharmD.         Prior Anticoagulation Instructions: INR 1.1 Today and tomorrow take 10 mg. Then take 7.5 mg everyday except 5 mg on wednesday. Recheck in 1 week.   Current Anticoagulation  Instructions: INR 2.9  Continue same dose of 7.5mg  daily except 5mg  on Wednesday.  Recheck INR in 3 weeks.

## 2010-02-26 NOTE — Progress Notes (Signed)
Summary: Nuc Pre-Procedure  Phone Note Outgoing Call   Call placed by: Antionette Char RN,  September 24, 2009 3:23 PM Call placed to: Patient Reason for Call: Confirm/change Appt Summary of Call: Reviewed information on My[employer]iew Information Sheet (see scanned document for further details).  Spoke with patient's wife.     Nuclear Med Background Indications for Stress Test: Evaluation for Ischemia, Surgical Clearance, Graft Patency, Stent Patency, PTCA Patency  Indications Comments: Pending cholecystectomy  History: Angioplasty, CABG, Echo, Heart Catheterization, Myocardial Infarction, Myocardial Perfusion Study, Stents  History Comments: 09 MPS EF 52%     Nuclear Pre-Procedure Cardiac Risk Factors: Family History - CAD, Hypertension, Lipids, NIDDM, Smoker Height (in): 71  Nuclear Med Study Referring MD:  Valera Castle MD

## 2010-02-26 NOTE — Medication Information (Signed)
Summary: ccr  Medications Added INR 1.1 Today and tomorrow take 10 mg. Then take 7.5 mg everyday except 5 mg on wednesday. Recheck in 1 week. QUINAPRIL HCL 20 MG  TABS (QUINAPRIL HCL) 1 by mouth daily QUINAPRIL HCL 20 MG  TABS (QUINAPRIL HCL) 1 by mouth daily DILTIAZEM HCL ER BEADS 180 MG XR24H-CAP (DILTIAZEM HCL ER BEADS) Take one capsule by mouth twice a day DILTIAZEM HCL ER BEADS 180 MG XR24H-CAP (DILTIAZEM HCL ER BEADS) Take one capsule by mouth twice a day PRAVASTATIN SODIUM 10 MG TABS (PRAVASTATIN SODIUM) Take one tablet by mouth daily at bedtime PRAVASTATIN SODIUM 10 MG TABS (PRAVASTATIN SODIUM) Take one tablet by mouth daily at bedtime WARFARIN SODIUM 1 MG TABS (WARFARIN SODIUM) Use as directed by Anticoagualtion Clinic WARFARIN SODIUM 1 MG TABS (WARFARIN SODIUM) Use as directed by Anticoagualtion Clinic NIACIN CR 1000 MG CR TAB (NIACIN) Take 1 tablet by mouth twice daily NIACIN CR 1000 MG CR TAB (NIACIN) Take 1 tablet by mouth twice daily ASPIRIN EC 325 MG TBEC (ASPIRIN) Take one tablet by mouth daily ASPIRIN 81 MG  TBEC (ASPIRIN) one by mouth every day CALCIUM CITRATE-VITAMIN D 315-200 MG-UNIT  TABS (CALCIUM CITRATE-VITAMIN D) 1 tab once daily CALCIUM CITRATE-VITAMIN D 315-200 MG-UNIT  TABS (CALCIUM CITRATE-VITAMIN D)  CALCIUM CITRATE-VITAMIN D 315-200 MG-UNIT  TABS (CALCIUM CITRATE-VITAMIN D) 1 tab once daily CO Q-10 120 MG CAPS (COENZYME Q10) 1 tab once daily CO Q-10 120 MG CAPS (COENZYME Q10) 1 tab once daily CO Q-10 120 MG CAPS (COENZYME Q10)  VITAMIN C 500 MG  TABS (ASCORBIC ACID) 1 tab once daily VITAMIN C 500 MG  TABS (ASCORBIC ACID)  VITAMIN C 500 MG  TABS (ASCORBIC ACID) 1 tab once daily VITAMIN E 600 UNIT  CAPS (VITAMIN E) 1 cap once daily VITAMIN E 600 UNIT  CAPS (VITAMIN E) 1 cap once daily VITAMIN E 600 UNIT  CAPS (VITAMIN E)  FOLTX 2.5-25-2 MG TABS (FA-PYRIDOXINE-CYANCOBALAMIN) 1 tab once daily * FOLTX (FOLIC ACID-VIT B6-VIT B12)  FOLTX 2.5-25-2 MG TABS  (FA-PYRIDOXINE-CYANCOBALAMIN) 1 tab once daily FUROSEMIDE 40 MG TABS (FUROSEMIDE) 1 tab as needed FUROSEMIDE 40 MG TABS (FUROSEMIDE) 1 tab as needed FUROSEMIDE 40 MG TABS (FUROSEMIDE) Take one tablet by mouth prn NITROGLYCERIN 0.4 MG SUBL (NITROGLYCERIN) One tablet under tongue every 5 minutes as needed for chest pain---may repeat times three METOPROLOL TARTRATE 25 MG TABS (METOPROLOL TARTRATE) 1 tab two times a day METOPROLOL TARTRATE 100 MG TABS (METOPROLOL TARTRATE) Take 1 tablet twice a day COUMADIN 5 MG TABS (WARFARIN SODIUM) Sunday - none, Monday - none, Tuesday - none, Wednesday - 1 tab, Thursday - none, Friday - none, Saturday - none WARFARIN SODIUM 5 MG TABS (WARFARIN SODIUM) Use as directed by Anticoagulation Clinic WARFARIN SODIUM 5 MG TABS (WARFARIN SODIUM) Use as directed by Anticoagulation Clinic WARFARIN SODIUM 7.5 MG TABS (WARFARIN SODIUM) Use as directed by Anticoagulation Clinic WARFARIN SODIUM 7.5 MG TABS (WARFARIN SODIUM) Use as directed by Anticoagulation Clinic COUMADIN 7.5 MG TABS (WARFARIN SODIUM) Sunday - 1 tab, Monday - 1 tab, Tuesday - 1 tab, Wednesday - none, Thursday - 1 tab, Friday - 1 tab, Saturday - 1 tab VITAMIN B-6 100 MG TABS (PYRIDOXINE HCL) 1 tab once daily VITAMIN B-6 100 MG TABS (PYRIDOXINE HCL) 1 tab once daily PRAVASTATIN SODIUM 20 MG TABS (PRAVASTATIN SODIUM) 1 tab once daily FOLIC ACID 1 MG TABS (FOLIC ACID) Take 1 tablet daily FOLIC ACID 800 MCG TABS (FOLIC ACID) 1 tab once daily  VITAMIN B-1 100 MG TABS (THIAMINE HCL) Take 1 tablet daily MULTIVITAMINS  TABS (MULTIPLE VITAMIN) Take 1 tablet daily VENTOLIN HFA 108 (90 BASE) MCG/ACT AERS (ALBUTEROL SULFATE) use as directed as needed VENTOLIN HFA 108 (90 BASE) MCG/ACT AERS (ALBUTEROL SULFATE) use as directed as needed ENSURE  LIQD (NUTRITIONAL SUPPLEMENTS) 113 ml three times a day BOOST HIGH PROTEIN  LIQD (NUTRITIONAL SUPPLEMENTS) 1 drink per once daily PROTONIX 40 MG TBEC (PANTOPRAZOLE SODIUM) 1 tab  once daily SEROQUEL 25 MG TABS (QUETIAPINE FUMARATE) 1 tab at bedtime SEROQUEL 25 MG TABS (QUETIAPINE FUMARATE) 1 tab at bedtime THIAMINE HCL 100 MG TABS (THIAMINE HCL) 1 tab once daily THIAMINE HCL 100 MG TABS (THIAMINE HCL) 1 tab once daily COENZYME Q10 10 MG CAPS (COENZYME Q10) 1 cap once daily COENZYME Q10 10 MG CAPS (COENZYME Q10) 1 cap once daily VITAMIN B-12 250 MCG TABS (CYANOCOBALAMIN) 1 tab once daily VITAMIN B-12 100 MCG TABS (CYANOCOBALAMIN) 1 tab once daily VITAMIN B-6 100 MG TABS (PYRIDOXINE HCL) 1 tab once daily VITAMIN B-6 100 MG TABS (PYRIDOXINE HCL) 1 tab once daily WARFARIN SODIUM 7.5 MG TABS (WARFARIN SODIUM) take as directed by anticoagulation clinic WARFARIN SODIUM 5 MG TABS (WARFARIN SODIUM) take as directed by anticoagulation clinic       Anticoagulant Therapy  Managed by: Lyna Poser, PharmD Referring MD: Valera Castle MD PCP: Windle Guard Supervising MD: Tenny Craw MD, Gunnar Fusi Indication 1: Atrial Fibrillation (ICD-427.31) Indication 2: Coronary Artery Bypass Graft (ICD-V45.81) Lab Used: [facility] Site: [facility] INR POC 1.1 INR RANGE 2 - 3  Dietary changes: no    Health status changes: yes    Bleeding/hemorrhagic complications: no    Recent/future hospitalizations: yes       Details: got out of hospital saturday morning. had cholecystectomy  Any changes in medication regimen? yes       Details: not taking quinapril or diltiazem anymore. metoprolol increased.  Recent/future dental: no  Any missed doses?: yes     Details: pt not currently on coumadin   Is patient compliant with meds? yes      Comments: Patient not currently on coumadin, has been off since july. Was on aspirin, but now not taking that.   Allergies: 1)  ! Lipitor 2)  ! Crestor 3)  ! Percocet 4)  ! * Seroquel  Anticoagulation Management History:      The patient comes in today for his initial visit for anticoagulation therapy.  Positive risk factors for bleeding include an age  of 72 years or older and presence of serious comorbidities.  The bleeding index is 'intermediate risk'.  Positive CHADS2 values include History of HTN and History of Diabetes.  Negative CHADS2 values include Age > 72 years old.  The start date was 05/28/1997.  Anticoagulation responsible provider: Tenny Craw MD, Gunnar Fusi.  INR POC: 1.1.  Exp: 09/2010.    Anticoagulation Management Assessment/Plan:      The patient's current anticoagulation dose is Warfarin sodium 7.5 mg tabs: take as directed by anticoagulation clinic, Warfarin sodium 5 mg tabs: take as directed by anticoagulation clinic.  The target INR is 2 - 3.  The next INR is due 12/19/2009.  Anticoagulation instructions were given to patient.  Results were reviewed/authorized by Lyna Poser, PharmD.         Prior Anticoagulation Instructions: INR 2.4  Continue on same dosage 7.5mg  daily except 5mg  on Wednesdays.  Recheck in 4 weeks.    Current Anticoagulation Instructions: INR 1.1 Today and tomorrow take 10  mg. Then take 7.5 mg everyday except 5 mg on wednesday. Recheck in 1 week.  Prescriptions: WARFARIN SODIUM 5 MG TABS (WARFARIN SODIUM) take as directed by anticoagulation clinic  #30 x 1   Entered by:   Lyna Poser PharmD   Authorized by:   Gaylord Shih, MD, [facility]   Signed by:   Lyna Poser PharmD on 12/12/2009   Method used:   Electronically to        [facility] (retail)       [address]       [place], [place]  [address]       Ph: [phone]       Fax: [phone]   RxID:   [id number] WARFARIN SODIUM 7.5 MG TABS (WARFARIN SODIUM) take as directed by anticoagulation clinic  #90 x 1   Entered by:   Lyna Poser PharmD   Authorized by:   Gaylord Shih, MD, [facility]   Signed by:   Lyna Poser PharmD on 12/12/2009   Method used:   Electronically to        [facility] (retail)       [address]       [place], [place]  [address]       Ph: [phone]       Fax:  [phone]   RxID:   [id number]   Appended Document: ccr Medications added on coumadin clinic for 11/18 visit note was an error. The only medications added were warfarin 7.5 mg an 5 mg.

## 2010-02-26 NOTE — Medication Information (Signed)
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 no    Recent/future dental: no  Any missed doses?: no       Is patient compliant with meds? yes       Allergies (verified): 1)  ! Lipitor  Anticoagulation Management History:      The patient is taking warfarin and comes in today for a routine follow up visit.  Positive risk factors for bleeding include an age of 72 years or older.  The bleeding index is 'intermediate risk'.  Negative CHADS2 values include Age > 72 years old.  The start date was 05/28/1997.  Anticoagulation responsible provider: Tenny Craw MD, Gunnar Fusi.  Cuvette Lot#: [id number].  Exp: 02/2010.    Anticoagulation Management Assessment/Plan:      The patient's current anticoagulation dose is Warfarin sodium 1 mg tabs: Use as directed by Anticoagualtion Clinic, Coumadin 5 mg tabs: Sunday - none, Monday - none, Tuesday - none, Wednesday - 1 tab, Thursday - none, Friday - none, Saturday - none, Coumadin 7.5 mg tabs: Sunday - 1 tab, Monday - 1 tab, Tuesday - 1 tab, Wednesday - none, Thursday - 1 tab, Friday - 1 tab, Saturday - 1 tab.  The target INR is 2 - 3.  The next INR is due 02/27/2009.  Anticoagulation instructions were given to patient.  Results were reviewed/authorized by Ysidro Evert, Pharm D Candidate.         Prior Anticoagulation Instructions: INR 2.3  Continue taking 5 mg each Wednesday and 7.5 mg on all other days.   Recheck 4 weeks.    Current Anticoagulation Instructions: INR:  2.3 Continue same dosage 7.5mg  daily except 5mg  Wednesdays. Recheck 4 weeks

## 2010-02-26 NOTE — Medication Information (Signed)
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 no    Recent/future dental: no  Any missed doses?: no       Is patient compliant with meds? yes       Allergies: 1)  ! Lipitor  Anticoagulation Management History:      The patient is taking warfarin and comes in today for a routine follow up visit.  Positive risk factors for bleeding include an age of 72 years or older.  The bleeding index is 'intermediate risk'.  Negative CHADS2 values include Age > 60 years old.  The start date was 05/28/1997.  Anticoagulation responsible provider: Tenny Craw MD, Gunnar Fusi.  INR POC: 2.5.  Cuvette Lot#: [id number].  Exp: 04/2010.    Anticoagulation Management Assessment/Plan:      The patient's current anticoagulation dose is Warfarin sodium 1 mg tabs: Use as directed by Anticoagualtion Clinic, Coumadin 5 mg tabs: Sunday - none, Monday - none, Tuesday - none, Wednesday - 1 tab, Thursday - none, Friday - none, Saturday - none, Coumadin 7.5 mg tabs: Sunday - 1 tab, Monday - 1 tab, Tuesday - 1 tab, Wednesday - none, Thursday - 1 tab, Friday - 1 tab, Saturday - 1 tab.  The target INR is 2 - 3.  The next INR is due 03/28/2009.  Anticoagulation instructions were given to patient.  Results were reviewed/authorized by Bethena Midget, RN, BSN.  He was notified by Stacie Glaze, BSN.         Prior Anticoagulation Instructions: INR: 2.3 Continue same dosage 7.5mg  daily except 5mg  Wednesdays. Recheck 4 weeks  Current Anticoagulation Instructions: INR 2.5 Continue 7.5mg s everyday except 5mg s on  Wednesdays. Recheck in 4 weeks.

## 2010-02-26 NOTE — Assessment & Plan Note (Signed)
Summary: Cardiology Nuclear Testing  Nuclear Med Background Indications for Stress Test: Evaluation for Ischemia, Surgical Clearance, Graft Patency, Stent Patency, PTCA Patency  Indications Comments: Pending cholecystectomy  History: Abnormal EKG, Angioplasty, CABG, Echo, Heart Catheterization, Myocardial Infarction, Myocardial Perfusion Study, Stents  History Comments: 09 MPS EF 52%     Nuclear Pre-Procedure Cardiac Risk Factors: Family History - CAD, Hypertension, Lipids, NIDDM, Smoker Caffeine/Decaff Intake: None NPO After: 8:00 PM Lungs: clear IV 0.9% NS with Angio Cath: 20g     IV Site: R Antecubital IV Started by: Irean Hong, RN Chest Size (in) 44     Height (in): 71 Weight (lb): 207 BMI: 28.97 Tech Comments: FBS @ home 124 this am.  Nuclear Med Study 1 or 2 day study:  1 day     Stress Test Type:  Eugenie Birks Reading MD:  Olga Millers, MD     Referring MD:  Valera Castle MD Resting Radionuclide:  Technetium 31m Tetrofosmin     Resting Radionuclide Dose:  11.0 mCi  Stress Radionuclide:  Technetium 22m Tetrofosmin     Stress Radionuclide Dose:  32.9 mCi   Stress Protocol   Lexiscan: 0.4 mg   Stress Test Technologist:  Milana Na, EMT-P     Nuclear Technologist:  Doyne Keel, CNMT  Rest Procedure  Myocardial perfusion imaging was performed at rest 45 minutes following the intravenous administration of Technetium 76m Tetrofosmin.  Stress Procedure  The patient received IV Lexiscan 0.4 mg over 15-seconds.  Technetium 15m Tetrofosmin injected at 30-seconds.  There were no significant changes and freq pvcs with infusion.  Quantitative spect images were obtained after a 45 minute delay.  QPS Raw Data Images:  There is no interference from other nuclear activity. Stress Images:  There is decreased uptake in the inferior wall Rest Images:  There is decreased uptake in the inferior wall. Subtraction (SDS):  No evidence of ischemia. Transient Ischemic Dilatation:   1.03  (Normal <1.22)  Lung/Heart Ratio:  0.35  (Normal <0.45)  Quantitative Gated Spect Images QGS EDV:  140 ml QGS ESV:  77 ml QGS EF:  45 % QGS cine images:  Inferobasal hypokinesis; evidence of left ventricular enlargement.   Overall Impression  Exercise Capacity: Lexiscan with no exercise. BP Response: Normal blood pressure response. Clinical Symptoms: No chest pain ECG Impression: No significant ST segment change suggestive of ischemia; patient in atrial fibrillation during the study. Overall Impression: Abnormal lexiscan nuclear study with prior inferior infarct; no ischemia.  Appended Document: Cardiology Nuclear Testing ok to have elective cholecystectomy.   Appended Document: Cardiology Nuclear Testing Pt aware of results. Mylo Red RN

## 2010-02-26 NOTE — Medication Information (Signed)
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 no    Recent/future dental: no  Any missed doses?: no       Is patient compliant with meds? yes       Allergies: 1)  ! Lipitor  Anticoagulation Management History:      The patient is taking warfarin and comes in today for a routine follow up visit.  Positive risk factors for bleeding include an age of 72 years or older and presence of serious comorbidities.  The bleeding index is 'intermediate risk'.  Positive CHADS2 values include History of HTN and History of Diabetes.  Negative CHADS2 values include Age > 78 years old.  The start date was 05/28/1997.  Anticoagulation responsible provider: Tenny Craw MD, Gunnar Fusi.  INR POC: 2.4.  Cuvette Lot#: [id number].  Exp: 08/2010.    Anticoagulation Management Assessment/Plan:      The patient's current anticoagulation dose is Warfarin sodium 5 mg tabs: Use as directed by Anticoagulation Clinic, Warfarin sodium 7.5 mg tabs: Use as directed by Anticoagulation Clinic.  The target INR is 2 - 3.  The next INR is due 07/17/2009.  Anticoagulation instructions were given to patient.  Results were reviewed/authorized by Eda Keys, PharmD.  He was notified by Eda Keys.         Prior Anticoagulation Instructions: INR 2.6 Continue 7.5mg s daily except 5mg s on Wednesdays. Recheck in 4 weeks.   Current Anticoagulation Instructions: INR 2.4  continue taking 5 mg on Wednesday and 7.5 mg all other days.  Return to clinic in 4 weeks.

## 2010-02-26 NOTE — Medication Information (Signed)
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 no    Recent/future dental: no  Any missed doses?: no       Is patient compliant with meds? yes       Allergies: 1)  ! Lipitor  Anticoagulation Management History:      The patient is taking warfarin and comes in today for a routine follow up visit.  Positive risk factors for bleeding include an age of 72 years or older and presence of serious comorbidities.  The bleeding index is 'intermediate risk'.  Positive CHADS2 values include History of HTN and History of Diabetes.  Negative CHADS2 values include Age > 72 years old.  The start date was 05/28/1997.  Anticoagulation responsible provider: Shirlee Latch MD, Donnald Tabar.  INR POC: 2.4.  Cuvette Lot#: [id number].  Exp: 09/2010.    Anticoagulation Management Assessment/Plan:      The patient's current anticoagulation dose is Warfarin sodium 5 mg tabs: Use as directed by Anticoagulation Clinic, Warfarin sodium 7.5 mg tabs: Use as directed by Anticoagulation Clinic.  The target INR is 2 - 3.  The next INR is due 08/14/2009.  Anticoagulation instructions were given to patient.  Results were reviewed/authorized by Cloyde Reams, RN, BSN.  He was notified by Cloyde Reams RN.         Prior Anticoagulation Instructions: INR 2.4  continue taking 5 mg on Wednesday and 7.5 mg all other days.  Return to clinic in 4 weeks.    Current Anticoagulation Instructions: INR 2.4  Continue on same dosage 7.5mg  daily except 5mg  on Wednesdays.  Recheck in 4 weeks.

## 2010-02-26 NOTE — Consult Note (Signed)
Summary: [facility]   Imported By: Roderic Ovens 08/11/2009 11:27:39  _____________________________________________________________________  External Attachment:    Type:   Image     Comment:   External Document

## 2010-02-26 NOTE — Letter (Signed)
Summary: Dr Lavonda Jumbo Office Visit Note   Dr Lavonda Jumbo Office Visit Note   Imported By: Roderic Ovens 01/20/2010 15:58:33  _____________________________________________________________________  External Attachment:    Type:   Image     Comment:   External Document

## 2010-02-26 NOTE — Letter (Signed)
Summary: Handout Printed  Printed Handout:  - Coumadin Instructions-w/out Meds 

## 2010-02-26 NOTE — Assessment & Plan Note (Signed)
Summary: 6wk f/u sl  Medications Added FOLIC ACID 800 MCG TABS (FOLIC ACID) 1 tab once daily BOOST HIGH PROTEIN  LIQD (NUTRITIONAL SUPPLEMENTS) 1 drink per once daily VITAMIN B-12 250 MCG TABS (CYANOCOBALAMIN) 1 tab once daily        Visit Type:  6 WK F/U Primary Provider:  Windle Guard  CC:  pt states he still has not had his cholecystectomy yet.....edema/right leg....denies any other complaints today.  History of Present Illness: Mr. Gullikson returns today for further evaluation and management of his atrial fibrillation and coronary artery disease.  He is awaiting elective cholecystectomy with Dr. Reesa Chew. Stress Myoview on September 1 showed an EF of 45% with inferior basal hypokinesia prior inferior infarct but no ischemia. He is having no symptoms of angina or ischemic equivalence.  He has been off Coumadin since his episode of sepsis back in July. Once he's had his gallbladder removed, we will start him back on Coumadin here in the office.  Current Medications (verified): 1)  Aspirin Ec 325 Mg Tbec (Aspirin) .... Take One Tablet By Mouth Daily 2)  Nitroglycerin 0.4 Mg Subl (Nitroglycerin) .... One Tablet Under Tongue Every 5 Minutes As Needed For Chest Pain---May Repeat Times Three 3)  Metoprolol Tartrate 100 Mg Tabs (Metoprolol Tartrate) .... Take 1 Tablet Twice A Day 4)  Pravastatin Sodium 20 Mg Tabs (Pravastatin Sodium) .Marland Kitchen.. 1 Tab Once Daily 5)  Folic Acid 800 Mcg Tabs (Folic Acid) .Marland Kitchen.. 1 Tab Once Daily 6)  Vitamin B-1 100 Mg Tabs (Thiamine Hcl) .... Take 1 Tablet Daily 7)  Multivitamins  Tabs (Multiple Vitamin) .... Take 1 Tablet Daily 8)  Boost High Protein  Liqd (Nutritional Supplements) .Marland Kitchen.. 1 Drink Per Once Daily 9)  Protonix 40 Mg Tbec (Pantoprazole Sodium) .Marland Kitchen.. 1 Tab Once Daily 10)  Vitamin B-12 250 Mcg Tabs (Cyanocobalamin) .Marland Kitchen.. 1 Tab Once Daily  Allergies: 1)  ! Lipitor 2)  ! Crestor 3)  ! Percocet 4)  ! * Seroquel  Past History:  Past Medical History: Last  updated: 09/12/2009 DELIRIUM (ICD-780.09) HYPERNATREMIA (ICD-276.0) SEPTIC SHOCK (ICD-785.59) MYOCARDIAL INFARCTION, ACUTE, SUBENDOCARDIAL (ICD-410.70) CAD, ARTERY BYPASS GRAFT/ 1993 (ICD-414.04) ATRIAL FIBRILLATION, CHRONIC (ICD-427.31) COUMADIN THERAPY (ICD-V58.61) HYPERLIPIDEMIA-MIXED (ICD-272.4) HYPERTENSION (ICD-401.9) DIABETES MELLITUS, TYPE II (ICD-250.00)      Past Surgical History: Last updated: 03/21/2009 CABG..1993  Family History: Last updated: 03/21/2009 Family History of Coronary Artery Disease:  Family History of Hypertension:   Social History: Last updated: 03/21/2009 Disabled  Married  Tobacco Use - Yes.  Alcohol Use - no Regular Exercise - no Retired   Risk Factors: Exercise: no (03/20/2008)  Risk Factors: Smoking Status: current (03/20/2008)  Clinical Reports Reviewed:  Nuclear Study:  09/25/2009:  Excerise capacity: Lexiscan with no exercise  Blood Pressure response: Normal blood pressure response  Clinical symptoms: No chest pain  ECG impression:No significant ST segment change suggestive of ischemia; patient in atrial fibrillation during the study  Overall impression: Abnormal lexiscan nuclear study with prior infarct; no ischemia.  Olga Millers, MD, [facility]   03/10/2007:  Excerise capacity: Adenosine with low level exercise  Blood Pressure response: Normal blood pressure repsonse  Clinical symptoms: Mild chest pain/dyspnea  ECG impression: No significant ST segement change suggestive of ischemia  Overall impression: Low risk abnormal adenosine stress nuclear study as noted above.  Jonelle Sidle, MD   Review of Systems        negative other than history of present illness  Vital Signs:  Patient profile:   70 year  old male Height:      71 inches Weight:      215.12 pounds BMI:     30.11 Pulse rate:   88 / minute Pulse rhythm:   irregular BP sitting:   138 / 70  (left arm) Cuff size:   large  Vitals Entered By:  Danielle Rankin, CMA (November 06, 2009 9:36 AM)  Physical Exam  General:  chronically ill Head:  normocephalic and atraumatic Eyes:  PERRLA/EOM intact; conjunctiva and lids normal. Neck:  Neck supple, no JVD. No masses, thyromegaly or abnormal cervical nodes. Lungs:  Clear bilaterally to auscultation and percussion. Heart:  PMI nondisplaced, irregular rate and rhythm, no gallop, carotids full without bruits Abdomen:  cholecystostomy tube in place Msk:  Back normal, normal gait. Muscle strength and tone normal. Pulses:  diminished in the right lower extremity Extremities:  trace left pedal edema.  chronic right lower extremity edema Neurologic:  Alert and oriented x 3. Skin:  Intact without lesions or rashes. Psych:  Normal affect.   Impression & Recommendations:  Problem # 1:  CAD, ARTERY BYPASS GRAFT/ 1993 (ICD-414.04) I I have cleared him for surgery with Dr. Reesa Chew. Once he has had surgery, his wife will call the Coumadin clinic to get him reenroll him back on Coumadin. His updated medication list for this problem includes:    Aspirin Ec 325 Mg Tbec (Aspirin) .Marland Kitchen... Take one tablet by mouth daily    Nitroglycerin 0.4 Mg Subl (Nitroglycerin) ..... One tablet under tongue every 5 minutes as needed for chest pain---may repeat times three    Metoprolol Tartrate 100 Mg Tabs (Metoprolol tartrate) .Marland Kitchen... Take 1 tablet twice a day  Problem # 2:  ATRIAL FIBRILLATION, CHRONIC (ICD-427.31) Assessment: Unchanged  His updated medication list for this problem includes:    Aspirin Ec 325 Mg Tbec (Aspirin) .Marland Kitchen... Take one tablet by mouth daily    Metoprolol Tartrate 100 Mg Tabs (Metoprolol tartrate) .Marland Kitchen... Take 1 tablet twice a day  Problem # 3:  MYOCARDIAL INFARCTION, ACUTE, SUBENDOCARDIAL (ICD-410.70) Assessment: Unchanged  His updated medication list for this problem includes:    Aspirin Ec 325 Mg Tbec (Aspirin) .Marland Kitchen... Take one tablet by mouth daily    Nitroglycerin 0.4 Mg Subl (Nitroglycerin)  ..... One tablet under tongue every 5 minutes as needed for chest pain---may repeat times three    Metoprolol Tartrate 100 Mg Tabs (Metoprolol tartrate) .Marland Kitchen... Take 1 tablet twice a day  Patient Instructions: 1)  Your physician recommends that you schedule a follow-up appointment in: 3 months 2)  Your physician recommends that you continue on your current medications as directed. Please refer to the Current Medication list given to you today. 3)  Please call our office- [phone]- after you have your surgery to schedule appointment with the Coumadin clinic.

## 2010-02-26 NOTE — Assessment & Plan Note (Signed)
Summary: eph/jml  Medications Added ASPIRIN EC 325 MG TBEC (ASPIRIN) Take one tablet by mouth daily METOPROLOL TARTRATE 100 MG TABS (METOPROLOL TARTRATE) Take 1 tablet twice a day PRAVASTATIN SODIUM 20 MG TABS (PRAVASTATIN SODIUM) 1 tab once daily FOLIC ACID 1 MG TABS (FOLIC ACID) Take 1 tablet daily VITAMIN B-1 100 MG TABS (THIAMINE HCL) Take 1 tablet daily MULTIVITAMINS  TABS (MULTIPLE VITAMIN) Take 1 tablet daily        Visit Type:  EPH Primary Jordan Clayton:  Windle Guard  CC:  ptlost 18 lb since 03/2009.Marland KitchenMarland Kitchenpt here for surg clearance for cholecystecomy...denies any cardiac complaints today.  History of Present Illness:  Mr Duquette returns today for further evaluation and management of his coronary artery disease and chronic atrial fibrillation.  He was in the hospital for nearly a month with sepsis and acute cholecystitis. He was on a ventilator and had to be reintubated one time. He also had acute renal failure requiring dialysis.  A percutaneous tube was placed. He is scheduled to see Dr. Ezzard Standing tomorrow for elective cholecystectomy.  His medication list today he is not matching up with his discharge summary. Specifically he is only taking metoprolol tartrate 100 mg a day.  He's had no chest pain, no palpitations, no orthopnea, no increased lower extremity edema, no bleeding, no abdominal pain, no fever or chills.  He is off Coumadin because of GI bleeding in the hospital. This was felt to be stress related.  In addition he had a non-Q-wave myocardial infarction. Obviously,. medical therapy was recommended.  He  needs clearance with a stress test.  Current Medications (verified): 1)  Aspirin Ec 325 Mg Tbec (Aspirin) .... Take One Tablet By Mouth Daily 2)  Nitroglycerin 0.4 Mg Subl (Nitroglycerin) .... One Tablet Under Tongue Every 5 Minutes As Needed For Chest Pain---May Repeat Times Three 3)  Metoprolol Tartrate 100 Mg Tabs (Metoprolol Tartrate) .Marland Kitchen.. 1 Tab Once Daily 4)   Pravastatin Sodium 20 Mg Tabs (Pravastatin Sodium) .Marland Kitchen.. 1 Tab Once Daily  Allergies: 1)  ! 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  Alcohol Use - no Regular Exercise - no Retired   Risk Factors: Exercise: no (03/20/2008)  Risk Factors: Smoking Status: current (03/20/2008)  Review of Systems       negative other than history of present illness  Vital Signs:  Patient profile:   70 year old male Height:      71 inches Weight:      209 pounds BMI:     29.25 Pulse rate:   102 / minute Pulse rhythm:   irregular BP sitting:   128 / 80  (left arm) Cuff size:   large  Vitals Entered By: Danielle Rankin, CMA (September 23, 2009 9:10 AM)  Physical Exam  General:  chronically ill, pale, in no acute distress. He is also lost about 20 some pounds. Head:  normocephalic and atraumatic Eyes:  nonicteric Neck:  Neck supple, no JVD. No masses, thyromegaly or abnormal cervical nodes. Lungs:  few crackles in the  right base Heart:  PMI nondisplaced, irregular rate and rhythm with a variable S1-S2, no gallop. Carotids equal bilaterally without bruit Abdomen:  percutaneous tube in place with biliary drainage Msk:  decreased ROM.   Pulses:  pulses normal in all 4 extremities Extremities:  trace left pedal edema and 1+ right pedal edema.  no sign  of DVT Neurologic:  Alert and oriented x 3. Skin:  Intact without lesions or rashes. Psych:  Normal affect.   Impression & Recommendations:  Problem # 1:  CAD, ARTERY BYPASS GRAFT/ 1993 (ICD-414.04) With his non-Q-wave infarction during extreme duress in the hospital, he needs surgical clearance with a stress Myoview. I have increased his metoprolol 200 mg twice a day for both his coronary disease and atrial fib. We have also put him back on a multivitamin. The following medications were removed from the medication list:    Quinapril Hcl 20 Mg Tabs (Quinapril hcl) .Marland Kitchen... 1 by mouth daily    Diltiazem Hcl Er Beads 180 Mg Xr24h-cap (Diltiazem hcl er beads) .Marland Kitchen... Take one capsule by mouth twice a day His updated medication list for this problem includes:    Aspirin Ec 325 Mg Tbec (Aspirin) .Marland Kitchen... Take one tablet by mouth daily    Nitroglycerin 0.4 Mg Subl (Nitroglycerin) ..... One tablet under tongue every 5 minutes as needed for chest pain---may repeat times three    Metoprolol Tartrate 100 Mg Tabs (Metoprolol tartrate) .Marland Kitchen... Take 1 tablet twice a day  Problem # 2:  ATRIAL FIBRILLATION, CHRONIC (ICD-427.31)  he will remain on aspirin 325 mg a day. His updated medication list for this problem includes:    Aspirin Ec 325 Mg Tbec (Aspirin) .Marland Kitchen... Take one tablet by mouth daily    Metoprolol Tartrate 100 Mg Tabs (Metoprolol tartrate) .Marland Kitchen... Take 1 tablet twice a day  Orders: EKG w/ Interpretation (93000) Nuclear Stress Test (Nuc Stress Test)  Problem # 3:  MYOCARDIAL INFARCTION, ACUTE, SUBENDOCARDIAL (ICD-410.70)  The following medications were removed from the medication list:    Quinapril Hcl 20 Mg Tabs (Quinapril hcl) .Marland Kitchen... 1 by mouth daily    Diltiazem Hcl Er Beads 180 Mg Xr24h-cap (Diltiazem hcl er beads) .Marland Kitchen... Take one capsule by mouth twice a day His updated medication list for this problem includes:    Aspirin Ec 325 Mg Tbec (Aspirin) .Marland Kitchen... Take one tablet by mouth daily     Nitroglycerin 0.4 Mg Subl (Nitroglycerin) ..... One tablet under tongue every 5 minutes as needed for chest pain---may repeat times three    Metoprolol Tartrate 100 Mg Tabs (Metoprolol tartrate) .Marland Kitchen... Take 1 tablet twice a day  Orders: EKG w/ Interpretation (93000) Nuclear Stress Test (Nuc Stress Test)  Problem # 4:  HYPERTENSION (ICD-401.9)  The following medications were removed from the medication list:    Quinapril Hcl 20 Mg Tabs (Quinapril hcl) .Marland Kitchen... 1 by mouth daily    Diltiazem Hcl Er Beads 180 Mg Xr24h-cap (Diltiazem hcl er beads) .Marland Kitchen... Take one capsule by mouth twice a day    Furosemide 40 Mg Tabs (Furosemide) .Marland Kitchen... 1 tab as needed His updated medication list for this problem includes:    Aspirin Ec 325 Mg Tbec (Aspirin) .Marland Kitchen... Take one tablet by mouth daily    Metoprolol Tartrate 100 Mg Tabs (Metoprolol tartrate) .Marland Kitchen... Take 1 tablet twice a day  Problem # 5:  HYPERLIPIDEMIA-MIXED (ICD-272.4)  The following medications were removed from the medication list:    Pravastatin Sodium 10 Mg Tabs (Pravastatin sodium) .Marland Kitchen... Take one tablet  by mouth daily at bedtime    Niacin Cr 1000 Mg Cr Tab (Niacin) .Marland Kitchen... Take 1 tablet by mouth twice daily His updated medication list for this problem includes:    Pravastatin Sodium 20 Mg Tabs (Pravastatin sodium) .Marland Kitchen... 1 tab once daily  Problem # 6:  COUMADIN THERAPY (ICD-V58.61)  Problem # 7:  DIABETES MELLITUS, TYPE II (ICD-250.00)  The following medications were removed from the medication list:    Quinapril Hcl 20 Mg Tabs (Quinapril hcl) .Marland Kitchen... 1 by mouth daily His updated medication list for this problem includes:    Aspirin Ec 325 Mg Tbec (Aspirin) .Marland Kitchen... Take one tablet by mouth daily  Patient Instructions: 1)  Your physician recommends that you schedule a follow-up appointment in: 4-6 weeks with Dr. Daleen Squibb 2)  Your physician has requested that you have a lexicsan myoview.  For further information please visit [email or web address]  Please  follow instruction sheet, as given. PRESURGICAL CLEARANCE 3)  Your physician has recommended you make the following change in your medication: increase metoprolol tartrate Prescriptions: METOPROLOL TARTRATE 100 MG TABS (METOPROLOL TARTRATE) Take 1 tablet twice a day  #180 x 3   Entered by:   Lisabeth Devoid RN   Authorized by:   Gaylord Shih, MD, [facility]   Signed by:   Lisabeth Devoid RN on 09/23/2009   Method used:   Electronically to        [facility] (retail)       [address]       [place], [place]  [address]       Ph: [phone]       Fax: [phone]   RxID:   [id number]

## 2010-02-26 NOTE — Progress Notes (Signed)
Summary: Records Request   Faxed OV, EKG & Stress to Delice Bison at [facility] ([phone]). Debby Freiberg  December 03, 2009 10:07 AM

## 2010-02-26 NOTE — Letter (Signed)
Summary: Dr Lavonda Jumbo Office Visit Note   Dr Lavonda Jumbo Office Visit Note   Imported By: Roderic Ovens 11/25/2009 13:28:19  _____________________________________________________________________  External Attachment:    Type:   Image     Comment:   External Document

## 2010-02-26 NOTE — Medication Information (Signed)
Summary: [email or web address].mp  Anticoagulant Therapy  Managed by: Weston Brass, PharmD Referring MD: Valera Castle MD PCP: Windle Guard Supervising MD: Tenny Craw MD, Gunnar Fusi Indication 1: Atrial Fibrillation (ICD-427.31) Indication 2: Coronary Artery Bypass Graft (ICD-V45.81) Lab Used: [facility] Site: [facility] INR POC 2.0 INR RANGE 2 - 3  Dietary changes: no    Health status changes: no    Bleeding/hemorrhagic complications: no    Recent/future hospitalizations: no    Any changes in medication regimen? no    Recent/future dental: no  Any missed doses?: yes     Details: Missed 1 dose last week   Is patient compliant with meds? yes       Allergies: 1)  ! Lipitor  Anticoagulation Management History:      The patient is taking warfarin and comes in today for a routine follow up visit.  Positive risk factors for bleeding include an age of 60 years or older and presence of serious comorbidities.  The bleeding index is 'intermediate risk'.  Positive CHADS2 values include History of HTN and History of Diabetes.  Negative CHADS2 values include Age > 67 years old.  The start date was 05/28/1997.  Anticoagulation responsible provider: Tenny Craw MD, Gunnar Fusi.  INR POC: 2.0.  Cuvette Lot#: [id number].  Exp: 05/2010.    Anticoagulation Management Assessment/Plan:      The patient's current anticoagulation dose is Warfarin sodium 5 mg tabs: Use as directed by Anticoagulation Clinic, Warfarin sodium 7.5 mg tabs: Use as directed by Anticoagulation Clinic.  The target INR is 2 - 3.  The next INR is due 05/22/2009.  Anticoagulation instructions were given to patient.  Results were reviewed/authorized by Weston Brass, PharmD.  He was notified by Weston Brass PharmD.         Prior Anticoagulation Instructions: INR 2.0  Extra 5 mg today only, then resume 7.5 mg daily except 5 mg each Wednesday.  Recheck in 4 weeks.    Current Anticoagulation Instructions: INR 2.0  Continue same dose of 7.5mg  every day except 5mg  on  Wednesday

## 2010-03-04 ENCOUNTER — Encounter (INDEPENDENT_AMBULATORY_CARE_PROVIDER_SITE_OTHER): Payer: PRIVATE HEALTH INSURANCE

## 2010-03-04 ENCOUNTER — Encounter: Payer: Self-pay | Admitting: Cardiology

## 2010-03-04 DIAGNOSIS — Z7901 Long term (current) use of anticoagulants: Secondary | ICD-10-CM

## 2010-03-04 DIAGNOSIS — I4891 Unspecified atrial fibrillation: Secondary | ICD-10-CM

## 2010-03-04 LAB — CONVERTED CEMR LAB: POC INR: 3.1

## 2010-03-09 DIAGNOSIS — I4891 Unspecified atrial fibrillation: Secondary | ICD-10-CM

## 2010-03-12 NOTE — Medication Information (Signed)
Summary: Coumadin Clinic   Anticoagulant Therapy  Managed by: Windell Hummingbird, RN Referring MD: Valera Castle MD PCP: Windle Guard Supervising MD: Shirlee Latch MD, Ladislaus Repsher Indication 1: Atrial Fibrillation (ICD-427.31) Indication 2: Coronary Artery Bypass Graft (ICD-V45.81) Lab Used: [facility] Site: [facility] INR POC 3.1 INR RANGE 2 - 3  Dietary changes: no    Health status changes: no    Bleeding/hemorrhagic complications: no    Recent/future hospitalizations: no    Any changes in medication regimen? no    Recent/future dental: no  Any missed doses?: no       Is patient compliant with meds? yes       Allergies: 1)  ! Lipitor 2)  ! Crestor 3)  ! Percocet 4)  ! * Seroquel  Anticoagulation Management History:      The patient is taking warfarin and comes in today for a routine follow up visit.  Positive risk factors for bleeding include an age of 50 years or older and presence of serious comorbidities.  The bleeding index is 'intermediate risk'.  Positive CHADS2 values include History of HTN and History of Diabetes.  Negative CHADS2 values include Age > 54 years old.  The start date was 05/28/1997.  Anticoagulation responsible provider: Shirlee Latch MD, Jaquila Santelli.  INR POC: 3.1.  Cuvette Lot#: [id number].  Exp: 01/2011.    Anticoagulation Management Assessment/Plan:      The patient's current anticoagulation dose is Warfarin sodium 7.5 mg tabs: take as directed by anticoagulation clinic, Warfarin sodium 5 mg tabs: take as directed by anticoagulation clinic.  The target INR is 2 - 3.  The next INR is due 03/25/2010.  Anticoagulation instructions were given to patient.  Results were reviewed/authorized by Windell Hummingbird, RN.  He was notified by Windell Hummingbird, RN.         Prior Anticoagulation Instructions: INR 3.3 (INR goal: 2-3)  Hold today's dose.  Take 7.5 mg everyday except 5 mg on Wednesdays.  Recheck in 3 weeks.    Current Anticoagulation Instructions: INR 3.1 Take 1/2 of today's dose  (take 2.5 mg). Then, take 7.5 mg every day, except take 5 mg on Wednesdays. Recheck in 3 weeks.

## 2010-03-25 ENCOUNTER — Encounter (INDEPENDENT_AMBULATORY_CARE_PROVIDER_SITE_OTHER): Payer: PRIVATE HEALTH INSURANCE

## 2010-03-25 ENCOUNTER — Encounter: Payer: Self-pay | Admitting: Cardiology

## 2010-03-25 DIAGNOSIS — I4891 Unspecified atrial fibrillation: Secondary | ICD-10-CM

## 2010-03-25 DIAGNOSIS — Z7901 Long term (current) use of anticoagulants: Secondary | ICD-10-CM

## 2010-03-25 LAB — CONVERTED CEMR LAB: POC INR: 2.8

## 2010-04-02 NOTE — Medication Information (Signed)
Summary: rov/ejm   Anticoagulant Therapy  Managed by: Windell Hummingbird, RN Referring MD: Valera Castle MD PCP: Windle Guard Supervising MD: Shirlee Latch MD, Nehemyah Foushee Indication 1: Atrial Fibrillation (ICD-427.31) Indication 2: Coronary Artery Bypass Graft (ICD-V45.81) Lab Used: [facility] Site: [facility] INR POC 2.8 INR RANGE 2 - 3  Dietary changes: no    Health status changes: no    Bleeding/hemorrhagic complications: no    Recent/future hospitalizations: no    Any changes in medication regimen? no    Recent/future dental: no  Any missed doses?: no       Is patient compliant with meds? yes       Allergies: 1)  ! Lipitor 2)  ! Crestor 3)  ! Percocet 4)  ! * Seroquel  Anticoagulation Management History:      Positive risk factors for bleeding include an age of 77 years or older and presence of serious comorbidities.  The bleeding index is 'intermediate risk'.  Positive CHADS2 values include History of HTN and History of Diabetes.  Negative CHADS2 values include Age > 48 years old.  The start date was 05/28/1997.  Anticoagulation responsible provider: Shirlee Latch MD, Kees Idrovo.  INR POC: 2.8.  Cuvette Lot#: [id number].  Exp: 01/2011.    Anticoagulation Management Assessment/Plan:      The patient's current anticoagulation dose is Warfarin sodium 7.5 mg tabs: take as directed by anticoagulation clinic, Warfarin sodium 5 mg tabs: take as directed by anticoagulation clinic.  The target INR is 2 - 3.  The next INR is due 04/15/2010.  Anticoagulation instructions were given to patient.  Results were reviewed/authorized by Windell Hummingbird, RN.  He was notified by Georgina Pillion PharmD.         Prior Anticoagulation Instructions: INR 3.1 Take 1/2 of today's dose (take 2.5 mg). Then, take 7.5 mg every day, except take 5 mg on Wednesdays. Recheck in 3 weeks.  Current Anticoagulation Instructions: Continue current regimen of 7.5 mg daily EXCEPT for 5 mg on Wednesdays only. Try to eat greens 4 days a  week.  INR 2.8

## 2010-04-07 LAB — DIFFERENTIAL
Eosinophils Absolute: 0.3 10*3/uL (ref 0.0–0.7)
Eosinophils Relative: 3 % (ref 0–5)
Lymphocytes Relative: 19 % (ref 12–46)
Lymphs Abs: 1.6 10*3/uL (ref 0.7–4.0)
Monocytes Relative: 11 % (ref 3–12)

## 2010-04-07 LAB — GLUCOSE, CAPILLARY
Glucose-Capillary: 126 mg/dL — ABNORMAL HIGH (ref 70–99)
Glucose-Capillary: 144 mg/dL — ABNORMAL HIGH (ref 70–99)
Glucose-Capillary: 145 mg/dL — ABNORMAL HIGH (ref 70–99)
Glucose-Capillary: 203 mg/dL — ABNORMAL HIGH (ref 70–99)

## 2010-04-07 LAB — COMPREHENSIVE METABOLIC PANEL
ALT: 18 U/L (ref 0–53)
AST: 23 U/L (ref 0–37)
Albumin: 3.4 g/dL — ABNORMAL LOW (ref 3.5–5.2)
CO2: 28 mEq/L (ref 19–32)
Calcium: 9.5 mg/dL (ref 8.4–10.5)
Creatinine, Ser: 1.23 mg/dL (ref 0.4–1.5)
GFR calc Af Amer: >60 mL/min (ref >60)
GFR calc non Af Amer: 58 mL/min — ABNORMAL LOW (ref >60)
Sodium: 141 mEq/L (ref 135–145)
Total Protein: 6.3 g/dL (ref 6.0–8.3)

## 2010-04-07 LAB — CBC
Hemoglobin: 11.8 g/dL — ABNORMAL LOW (ref 13.0–17.0)
MCH: 29.9 pg (ref 26.0–34.0)
MCHC: 32.3 g/dL (ref 30.0–36.0)
Platelets: 245 10*3/uL (ref 150–400)
RDW: 13.6 % (ref 11.5–15.5)

## 2010-04-07 LAB — SURGICAL PCR SCREEN: MRSA, PCR: NEGATIVE

## 2010-04-10 LAB — DIFFERENTIAL
Basophils Absolute: 0 10*3/uL (ref 0.0–0.1)
Basophils Absolute: 0 10*3/uL (ref 0.0–0.1)
Basophils Relative: 0 % (ref 0–1)
Eosinophils Absolute: 0.2 10*3/uL (ref 0.0–0.7)
Eosinophils Relative: 1 % (ref 0–5)
Eosinophils Relative: 2 % (ref 0–5)
Lymphocytes Relative: 10 % — ABNORMAL LOW (ref 12–46)
Lymphs Abs: 1.4 10*3/uL (ref 0.7–4.0)
Monocytes Absolute: 1.4 10*3/uL — ABNORMAL HIGH (ref 0.1–1.0)
Monocytes Relative: 10 % (ref 3–12)
Neutro Abs: 10.7 10*3/uL — ABNORMAL HIGH (ref 1.7–7.7)

## 2010-04-10 LAB — BASIC METABOLIC PANEL
BUN: 14 mg/dL (ref 6–23)
BUN: 18 mg/dL (ref 6–23)
CO2: 27 mEq/L (ref 19–32)
CO2: 27 mEq/L (ref 19–32)
CO2: 29 mEq/L (ref 19–32)
CO2: 30 mEq/L (ref 19–32)
Calcium: 8.4 mg/dL (ref 8.4–10.5)
Calcium: 8.6 mg/dL (ref 8.4–10.5)
Calcium: 8.7 mg/dL (ref 8.4–10.5)
Chloride: 106 mEq/L (ref 96–112)
Chloride: 107 mEq/L (ref 96–112)
Chloride: 108 mEq/L (ref 96–112)
Chloride: 109 mEq/L (ref 96–112)
Creatinine, Ser: 1.54 mg/dL — ABNORMAL HIGH (ref 0.4–1.5)
Creatinine, Ser: 1.58 mg/dL — ABNORMAL HIGH (ref 0.4–1.5)
Creatinine, Ser: 1.68 mg/dL — ABNORMAL HIGH (ref 0.4–1.5)
Creatinine, Ser: 1.79 mg/dL — ABNORMAL HIGH (ref 0.4–1.5)
GFR calc Af Amer: 46 mL/min — ABNORMAL LOW (ref >60)
GFR calc Af Amer: 46 mL/min — ABNORMAL LOW (ref >60)
GFR calc Af Amer: 49 mL/min — ABNORMAL LOW (ref >60)
GFR calc Af Amer: 53 mL/min — ABNORMAL LOW (ref >60)
GFR calc non Af Amer: 33 mL/min — ABNORMAL LOW (ref >60)
GFR calc non Af Amer: 40 mL/min — ABNORMAL LOW (ref >60)
Glucose, Bld: 102 mg/dL — ABNORMAL HIGH (ref 70–99)
Glucose, Bld: 105 mg/dL — ABNORMAL HIGH (ref 70–99)
Glucose, Bld: 110 mg/dL — ABNORMAL HIGH (ref 70–99)
Glucose, Bld: 114 mg/dL — ABNORMAL HIGH (ref 70–99)
Potassium: 3.5 mEq/L (ref 3.5–5.1)
Sodium: 140 mEq/L (ref 135–145)
Sodium: 142 mEq/L (ref 135–145)
Sodium: 143 mEq/L (ref 135–145)
Sodium: 143 mEq/L (ref 135–145)

## 2010-04-10 LAB — GLUCOSE, CAPILLARY
Glucose-Capillary: 100 mg/dL — ABNORMAL HIGH (ref 70–99)
Glucose-Capillary: 103 mg/dL — ABNORMAL HIGH (ref 70–99)
Glucose-Capillary: 103 mg/dL — ABNORMAL HIGH (ref 70–99)
Glucose-Capillary: 104 mg/dL — ABNORMAL HIGH (ref 70–99)
Glucose-Capillary: 109 mg/dL — ABNORMAL HIGH (ref 70–99)
Glucose-Capillary: 116 mg/dL — ABNORMAL HIGH (ref 70–99)
Glucose-Capillary: 121 mg/dL — ABNORMAL HIGH (ref 70–99)
Glucose-Capillary: 122 mg/dL — ABNORMAL HIGH (ref 70–99)
Glucose-Capillary: 125 mg/dL — ABNORMAL HIGH (ref 70–99)
Glucose-Capillary: 129 mg/dL — ABNORMAL HIGH (ref 70–99)
Glucose-Capillary: 145 mg/dL — ABNORMAL HIGH (ref 70–99)
Glucose-Capillary: 91 mg/dL (ref 70–99)
Glucose-Capillary: 97 mg/dL (ref 70–99)
Glucose-Capillary: 97 mg/dL (ref 70–99)
Glucose-Capillary: 97 mg/dL (ref 70–99)
Glucose-Capillary: 98 mg/dL (ref 70–99)
Glucose-Capillary: 99 mg/dL (ref 70–99)

## 2010-04-10 LAB — CBC
HCT: 27.2 % — ABNORMAL LOW (ref 39.0–52.0)
Hemoglobin: 8.6 g/dL — ABNORMAL LOW (ref 13.0–17.0)
Hemoglobin: 9.1 g/dL — ABNORMAL LOW (ref 13.0–17.0)
Hemoglobin: 9.3 g/dL — ABNORMAL LOW (ref 13.0–17.0)
MCH: 31.6 pg (ref 26.0–34.0)
MCH: 31.7 pg (ref 26.0–34.0)
MCH: 32.5 pg (ref 26.0–34.0)
MCHC: 31.2 g/dL (ref 30.0–36.0)
MCHC: 32.1 g/dL (ref 30.0–36.0)
MCV: 101.4 fL — ABNORMAL HIGH (ref 78.0–100.0)
MCV: 101.5 fL — ABNORMAL HIGH (ref 78.0–100.0)
MCV: 99.7 fL (ref 78.0–100.0)
Platelets: 254 10*3/uL (ref 150–400)
Platelets: 255 10*3/uL (ref 150–400)
Platelets: 331 10*3/uL (ref 150–400)
RBC: 2.72 MIL/uL — ABNORMAL LOW (ref 4.22–5.81)
RBC: 2.73 MIL/uL — ABNORMAL LOW (ref 4.22–5.81)
RBC: 2.86 MIL/uL — ABNORMAL LOW (ref 4.22–5.81)
RDW: 14.9 % (ref 11.5–15.5)
RDW: 15 % (ref 11.5–15.5)
WBC: 13.7 10*3/uL — ABNORMAL HIGH (ref 4.0–10.5)
WBC: 8.8 10*3/uL (ref 4.0–10.5)
WBC: 9.7 10*3/uL (ref 4.0–10.5)

## 2010-04-10 LAB — LIPID PANEL
HDL: 25 mg/dL — ABNORMAL LOW (ref >39)
Total CHOL/HDL Ratio: 4.8 RATIO
Triglycerides: 147 mg/dL (ref <150)
VLDL: 29 mg/dL (ref 0–40)

## 2010-04-11 LAB — PROTIME-INR
INR: 1.14 (ref 0.00–1.49)
INR: 1.24 (ref 0.00–1.49)
INR: 1.28 (ref 0.00–1.49)
INR: 1.32 (ref 0.00–1.49)
INR: 1.32 (ref 0.00–1.49)
INR: 1.36 (ref 0.00–1.49)
INR: 1.37 (ref 0.00–1.49)
INR: 1.43 (ref 0.00–1.49)
INR: 1.45 (ref 0.00–1.49)
Prothrombin Time: 15.5 seconds — ABNORMAL HIGH (ref 11.6–15.2)
Prothrombin Time: 15.9 seconds — ABNORMAL HIGH (ref 11.6–15.2)
Prothrombin Time: 17.4 seconds — ABNORMAL HIGH (ref 11.6–15.2)
Prothrombin Time: 17.5 seconds — ABNORMAL HIGH (ref 11.6–15.2)

## 2010-04-11 LAB — GLUCOSE, CAPILLARY
Glucose-Capillary: 103 mg/dL — ABNORMAL HIGH (ref 70–99)
Glucose-Capillary: 104 mg/dL — ABNORMAL HIGH (ref 70–99)
Glucose-Capillary: 105 mg/dL — ABNORMAL HIGH (ref 70–99)
Glucose-Capillary: 110 mg/dL — ABNORMAL HIGH (ref 70–99)
Glucose-Capillary: 112 mg/dL — ABNORMAL HIGH (ref 70–99)
Glucose-Capillary: 117 mg/dL — ABNORMAL HIGH (ref 70–99)
Glucose-Capillary: 120 mg/dL — ABNORMAL HIGH (ref 70–99)
Glucose-Capillary: 121 mg/dL — ABNORMAL HIGH (ref 70–99)
Glucose-Capillary: 122 mg/dL — ABNORMAL HIGH (ref 70–99)
Glucose-Capillary: 123 mg/dL — ABNORMAL HIGH (ref 70–99)
Glucose-Capillary: 126 mg/dL — ABNORMAL HIGH (ref 70–99)
Glucose-Capillary: 126 mg/dL — ABNORMAL HIGH (ref 70–99)
Glucose-Capillary: 128 mg/dL — ABNORMAL HIGH (ref 70–99)
Glucose-Capillary: 129 mg/dL — ABNORMAL HIGH (ref 70–99)
Glucose-Capillary: 131 mg/dL — ABNORMAL HIGH (ref 70–99)
Glucose-Capillary: 133 mg/dL — ABNORMAL HIGH (ref 70–99)
Glucose-Capillary: 133 mg/dL — ABNORMAL HIGH (ref 70–99)
Glucose-Capillary: 133 mg/dL — ABNORMAL HIGH (ref 70–99)
Glucose-Capillary: 135 mg/dL — ABNORMAL HIGH (ref 70–99)
Glucose-Capillary: 136 mg/dL — ABNORMAL HIGH (ref 70–99)
Glucose-Capillary: 140 mg/dL — ABNORMAL HIGH (ref 70–99)
Glucose-Capillary: 140 mg/dL — ABNORMAL HIGH (ref 70–99)
Glucose-Capillary: 141 mg/dL — ABNORMAL HIGH (ref 70–99)
Glucose-Capillary: 142 mg/dL — ABNORMAL HIGH (ref 70–99)
Glucose-Capillary: 143 mg/dL — ABNORMAL HIGH (ref 70–99)
Glucose-Capillary: 145 mg/dL — ABNORMAL HIGH (ref 70–99)
Glucose-Capillary: 145 mg/dL — ABNORMAL HIGH (ref 70–99)
Glucose-Capillary: 150 mg/dL — ABNORMAL HIGH (ref 70–99)
Glucose-Capillary: 153 mg/dL — ABNORMAL HIGH (ref 70–99)
Glucose-Capillary: 169 mg/dL — ABNORMAL HIGH (ref 70–99)
Glucose-Capillary: 70 mg/dL (ref 70–99)
Glucose-Capillary: 88 mg/dL (ref 70–99)
Glucose-Capillary: 89 mg/dL (ref 70–99)
Glucose-Capillary: 89 mg/dL (ref 70–99)
Glucose-Capillary: 91 mg/dL (ref 70–99)
Glucose-Capillary: 92 mg/dL (ref 70–99)
Glucose-Capillary: 93 mg/dL (ref 70–99)
Glucose-Capillary: 96 mg/dL (ref 70–99)
Glucose-Capillary: 97 mg/dL (ref 70–99)
Glucose-Capillary: 98 mg/dL (ref 70–99)

## 2010-04-11 LAB — COMPREHENSIVE METABOLIC PANEL
ALT: 14 U/L (ref 0–53)
ALT: 17 U/L (ref 0–53)
AST: 47 U/L — ABNORMAL HIGH (ref 0–37)
AST: 62 U/L — ABNORMAL HIGH (ref 0–37)
AST: 66 U/L — ABNORMAL HIGH (ref 0–37)
Albumin: 2.1 g/dL — ABNORMAL LOW (ref 3.5–5.2)
Albumin: 2.2 g/dL — ABNORMAL LOW (ref 3.5–5.2)
Albumin: 2.4 g/dL — ABNORMAL LOW (ref 3.5–5.2)
Alkaline Phosphatase: 143 U/L — ABNORMAL HIGH (ref 39–117)
Alkaline Phosphatase: 145 U/L — ABNORMAL HIGH (ref 39–117)
Alkaline Phosphatase: 154 U/L — ABNORMAL HIGH (ref 39–117)
BUN: 70 mg/dL — ABNORMAL HIGH (ref 6–23)
BUN: 80 mg/dL — ABNORMAL HIGH (ref 6–23)
BUN: 89 mg/dL — ABNORMAL HIGH (ref 6–23)
CO2: 24 mEq/L (ref 19–32)
CO2: 24 mEq/L (ref 19–32)
CO2: 26 mEq/L (ref 19–32)
Calcium: 7.9 mg/dL — ABNORMAL LOW (ref 8.4–10.5)
Calcium: 8 mg/dL — ABNORMAL LOW (ref 8.4–10.5)
Chloride: 103 mEq/L (ref 96–112)
Chloride: 105 mEq/L (ref 96–112)
Chloride: 105 mEq/L (ref 96–112)
Creatinine, Ser: 8.72 mg/dL — ABNORMAL HIGH (ref 0.4–1.5)
GFR calc Af Amer: 12 mL/min — ABNORMAL LOW (ref >60)
GFR calc Af Amer: 7 mL/min — ABNORMAL LOW (ref >60)
GFR calc non Af Amer: 10 mL/min — ABNORMAL LOW (ref >60)
GFR calc non Af Amer: 7 mL/min — ABNORMAL LOW (ref >60)
GFR calc non Af Amer: 8 mL/min — ABNORMAL LOW (ref >60)
Glucose, Bld: 128 mg/dL — ABNORMAL HIGH (ref 70–99)
Glucose, Bld: 98 mg/dL (ref 70–99)
Potassium: 3.7 mEq/L (ref 3.5–5.1)
Potassium: 3.8 mEq/L (ref 3.5–5.1)
Sodium: 140 mEq/L (ref 135–145)
Sodium: 141 mEq/L (ref 135–145)
Total Bilirubin: 0.5 mg/dL (ref 0.3–1.2)
Total Bilirubin: 0.8 mg/dL (ref 0.3–1.2)
Total Bilirubin: 1 mg/dL (ref 0.3–1.2)

## 2010-04-11 LAB — CBC
HCT: 25.2 % — ABNORMAL LOW (ref 39.0–52.0)
HCT: 25.4 % — ABNORMAL LOW (ref 39.0–52.0)
HCT: 26.9 % — ABNORMAL LOW (ref 39.0–52.0)
HCT: 29.7 % — ABNORMAL LOW (ref 39.0–52.0)
Hemoglobin: 10.1 g/dL — ABNORMAL LOW (ref 13.0–17.0)
Hemoglobin: 10.3 g/dL — ABNORMAL LOW (ref 13.0–17.0)
Hemoglobin: 8.4 g/dL — ABNORMAL LOW (ref 13.0–17.0)
Hemoglobin: 8.5 g/dL — ABNORMAL LOW (ref 13.0–17.0)
Hemoglobin: 8.8 g/dL — ABNORMAL LOW (ref 13.0–17.0)
Hemoglobin: 9 g/dL — ABNORMAL LOW (ref 13.0–17.0)
Hemoglobin: 9.3 g/dL — ABNORMAL LOW (ref 13.0–17.0)
MCH: 33.2 pg (ref 26.0–34.0)
MCH: 33.2 pg (ref 26.0–34.0)
MCH: 33.4 pg (ref 26.0–34.0)
MCH: 33.4 pg (ref 26.0–34.0)
MCH: 33.6 pg (ref 26.0–34.0)
MCHC: 32.8 g/dL (ref 30.0–36.0)
MCHC: 32.9 g/dL (ref 30.0–36.0)
MCHC: 33.3 g/dL (ref 30.0–36.0)
MCHC: 33.5 g/dL (ref 30.0–36.0)
MCHC: 33.7 g/dL (ref 30.0–36.0)
MCHC: 34 g/dL (ref 30.0–36.0)
MCV: 101 fL — ABNORMAL HIGH (ref 78.0–100.0)
MCV: 97.7 fL (ref 78.0–100.0)
MCV: 97.8 fL (ref 78.0–100.0)
MCV: 98.7 fL (ref 78.0–100.0)
MCV: 99.1 fL (ref 78.0–100.0)
MCV: 99.4 fL (ref 78.0–100.0)
Platelets: 194 10*3/uL (ref 150–400)
Platelets: 217 10*3/uL (ref 150–400)
Platelets: 241 10*3/uL (ref 150–400)
Platelets: 249 10*3/uL (ref 150–400)
Platelets: 255 10*3/uL (ref 150–400)
Platelets: 302 10*3/uL (ref 150–400)
RBC: 2.7 MIL/uL — ABNORMAL LOW (ref 4.22–5.81)
RBC: 2.75 MIL/uL — ABNORMAL LOW (ref 4.22–5.81)
RBC: 2.75 MIL/uL — ABNORMAL LOW (ref 4.22–5.81)
RBC: 3.03 MIL/uL — ABNORMAL LOW (ref 4.22–5.81)
RBC: 3.15 MIL/uL — ABNORMAL LOW (ref 4.22–5.81)
RDW: 13.6 % (ref 11.5–15.5)
RDW: 15.1 % (ref 11.5–15.5)
WBC: 10.6 10*3/uL — ABNORMAL HIGH (ref 4.0–10.5)
WBC: 12 10*3/uL — ABNORMAL HIGH (ref 4.0–10.5)
WBC: 12.1 10*3/uL — ABNORMAL HIGH (ref 4.0–10.5)

## 2010-04-11 LAB — BASIC METABOLIC PANEL
BUN: 41 mg/dL — ABNORMAL HIGH (ref 6–23)
CO2: 26 mEq/L (ref 19–32)
CO2: 28 mEq/L (ref 19–32)
CO2: 28 mEq/L (ref 19–32)
CO2: 29 mEq/L (ref 19–32)
Calcium: 7.6 mg/dL — ABNORMAL LOW (ref 8.4–10.5)
Calcium: 8 mg/dL — ABNORMAL LOW (ref 8.4–10.5)
Calcium: 8.4 mg/dL (ref 8.4–10.5)
Calcium: 8.6 mg/dL (ref 8.4–10.5)
Chloride: 101 mEq/L (ref 96–112)
Chloride: 111 mEq/L (ref 96–112)
Chloride: 111 mEq/L (ref 96–112)
Creatinine, Ser: 1.96 mg/dL — ABNORMAL HIGH (ref 0.4–1.5)
Creatinine, Ser: 2.41 mg/dL — ABNORMAL HIGH (ref 0.4–1.5)
Creatinine, Ser: 3.77 mg/dL — ABNORMAL HIGH (ref 0.4–1.5)
GFR calc Af Amer: 18 mL/min — ABNORMAL LOW (ref >60)
GFR calc Af Amer: 41 mL/min — ABNORMAL LOW (ref >60)
GFR calc non Af Amer: 27 mL/min — ABNORMAL LOW (ref >60)
Glucose, Bld: 101 mg/dL — ABNORMAL HIGH (ref 70–99)
Glucose, Bld: 131 mg/dL — ABNORMAL HIGH (ref 70–99)
Glucose, Bld: 95 mg/dL (ref 70–99)
Potassium: 3.3 mEq/L — ABNORMAL LOW (ref 3.5–5.1)
Potassium: 4.1 mEq/L (ref 3.5–5.1)
Sodium: 147 mEq/L — ABNORMAL HIGH (ref 135–145)
Sodium: 147 mEq/L — ABNORMAL HIGH (ref 135–145)

## 2010-04-11 LAB — HEPARIN LEVEL (UNFRACTIONATED)
Heparin Unfractionated: 0.26 IU/mL — ABNORMAL LOW (ref 0.30–0.70)
Heparin Unfractionated: 0.28 IU/mL — ABNORMAL LOW (ref 0.30–0.70)
Heparin Unfractionated: 0.46 IU/mL (ref 0.30–0.70)
Heparin Unfractionated: 0.51 IU/mL (ref 0.30–0.70)

## 2010-04-11 LAB — BLOOD GAS, ARTERIAL
Acid-Base Excess: 2.1 mmol/L — ABNORMAL HIGH (ref 0.0–2.0)
Acid-Base Excess: 3 mmol/L — ABNORMAL HIGH (ref 0.0–2.0)
Bicarbonate: 20.7 mEq/L (ref 20.0–24.0)
Bicarbonate: 25.2 mEq/L — ABNORMAL HIGH (ref 20.0–24.0)
Bicarbonate: 26.9 mEq/L — ABNORMAL HIGH (ref 20.0–24.0)
Drawn by: 24485
Drawn by: 30575
Drawn by: 312881
FIO2: 0.3 %
FIO2: 0.3 %
FIO2: 0.3 %
MECHVT: 600 mL
MECHVT: 600 mL
MECHVT: 600 mL
MECHVT: 600 mL
O2 Saturation: 95.2 %
O2 Saturation: 96 %
O2 Saturation: 97 %
O2 Saturation: 97.9 %
O2 Saturation: 98 %
PEEP: 5 cmH2O
PEEP: 5 cmH2O
PEEP: 5 cmH2O
PEEP: 5 cmH2O
Patient temperature: 98.6
Patient temperature: 98.6
Patient temperature: 98.7
Patient temperature: 99.3
RATE: 12 resp/min
RATE: 12 resp/min
RATE: 18 resp/min
TCO2: 23.9 mmol/L (ref 0–100)
TCO2: 24.5 mmol/L (ref 0–100)
TCO2: 26.3 mmol/L (ref 0–100)
pCO2 arterial: 33.6 mmHg — ABNORMAL LOW (ref 35.0–45.0)
pCO2 arterial: 36 mmHg (ref 35.0–45.0)
pCO2 arterial: 37.5 mmHg (ref 35.0–45.0)
pH, Arterial: 7.371 (ref 7.350–7.450)
pH, Arterial: 7.399 (ref 7.350–7.450)
pH, Arterial: 7.418 (ref 7.350–7.450)
pH, Arterial: 7.464 — ABNORMAL HIGH (ref 7.350–7.450)
pO2, Arterial: 73.6 mmHg — ABNORMAL LOW (ref 80.0–100.0)
pO2, Arterial: 85.6 mmHg (ref 80.0–100.0)
pO2, Arterial: 94.1 mmHg (ref 80.0–100.0)

## 2010-04-11 LAB — RENAL FUNCTION PANEL
Albumin: 2.2 g/dL — ABNORMAL LOW (ref 3.5–5.2)
Albumin: 2.3 g/dL — ABNORMAL LOW (ref 3.5–5.2)
Albumin: 2.4 g/dL — ABNORMAL LOW (ref 3.5–5.2)
BUN: 41 mg/dL — ABNORMAL HIGH (ref 6–23)
BUN: 41 mg/dL — ABNORMAL HIGH (ref 6–23)
BUN: 42 mg/dL — ABNORMAL HIGH (ref 6–23)
BUN: 85 mg/dL — ABNORMAL HIGH (ref 6–23)
CO2: 25 mEq/L (ref 19–32)
CO2: 30 mEq/L (ref 19–32)
CO2: 30 mEq/L (ref 19–32)
Calcium: 7.7 mg/dL — ABNORMAL LOW (ref 8.4–10.5)
Calcium: 8.2 mg/dL — ABNORMAL LOW (ref 8.4–10.5)
Calcium: 9 mg/dL (ref 8.4–10.5)
Chloride: 106 mEq/L (ref 96–112)
Chloride: 106 mEq/L (ref 96–112)
Chloride: 106 mEq/L (ref 96–112)
Creatinine, Ser: 2.53 mg/dL — ABNORMAL HIGH (ref 0.4–1.5)
Creatinine, Ser: 3.54 mg/dL — ABNORMAL HIGH (ref 0.4–1.5)
Creatinine, Ser: 7.47 mg/dL — ABNORMAL HIGH (ref 0.4–1.5)
GFR calc Af Amer: 11 mL/min — ABNORMAL LOW (ref >60)
GFR calc Af Amer: 31 mL/min — ABNORMAL LOW (ref >60)
GFR calc non Af Amer: 25 mL/min — ABNORMAL LOW (ref >60)
GFR calc non Af Amer: 9 mL/min — ABNORMAL LOW (ref >60)
Glucose, Bld: 121 mg/dL — ABNORMAL HIGH (ref 70–99)
Glucose, Bld: 130 mg/dL — ABNORMAL HIGH (ref 70–99)
Glucose, Bld: 136 mg/dL — ABNORMAL HIGH (ref 70–99)
Glucose, Bld: 156 mg/dL — ABNORMAL HIGH (ref 70–99)
Glucose, Bld: 99 mg/dL (ref 70–99)
Phosphorus: 3.9 mg/dL (ref 2.3–4.6)
Phosphorus: 4.1 mg/dL (ref 2.3–4.6)
Phosphorus: 4.4 mg/dL (ref 2.3–4.6)
Phosphorus: 8.1 mg/dL — ABNORMAL HIGH (ref 2.3–4.6)
Potassium: 3.7 mEq/L (ref 3.5–5.1)
Potassium: 3.9 mEq/L (ref 3.5–5.1)
Potassium: 4.1 mEq/L (ref 3.5–5.1)
Sodium: 142 mEq/L (ref 135–145)
Sodium: 144 mEq/L (ref 135–145)
Sodium: 144 mEq/L (ref 135–145)

## 2010-04-11 LAB — POTASSIUM: Potassium: 3.8 mEq/L (ref 3.5–5.1)

## 2010-04-11 LAB — IRON AND TIBC
Iron: 29 ug/dL — ABNORMAL LOW (ref 42–135)
TIBC: 172 ug/dL — ABNORMAL LOW (ref 215–435)

## 2010-04-11 LAB — PHOSPHORUS: Phosphorus: 5.5 mg/dL — ABNORMAL HIGH (ref 2.3–4.6)

## 2010-04-11 LAB — MAGNESIUM
Magnesium: 2 mg/dL (ref 1.5–2.5)
Magnesium: 2.1 mg/dL (ref 1.5–2.5)

## 2010-04-11 LAB — FERRITIN: Ferritin: 566 ng/mL — ABNORMAL HIGH (ref 22–322)

## 2010-04-12 LAB — BLOOD GAS, ARTERIAL
Acid-base deficit: 5 mmol/L — ABNORMAL HIGH (ref 0.0–2.0)
Acid-base deficit: 6.7 mmol/L — ABNORMAL HIGH (ref 0.0–2.0)
Bicarbonate: 15.4 mEq/L — ABNORMAL LOW (ref 20.0–24.0)
Bicarbonate: 18.2 mEq/L — ABNORMAL LOW (ref 20.0–24.0)
Drawn by: 318431
FIO2: 0.3 %
FIO2: 0.4 %
MECHVT: 520 mL
MECHVT: 600 mL
O2 Saturation: 98.1 %
O2 Saturation: 98.4 %
O2 Saturation: 98.7 %
PEEP: 0 cmH2O
PEEP: 5 cmH2O
Patient temperature: 98.6
Patient temperature: 98.8
RATE: 12 resp/min
TCO2: 18.7 mmol/L (ref 0–100)
TCO2: 19.1 mmol/L (ref 0–100)
TCO2: 20.4 mmol/L (ref 0–100)
pCO2 arterial: 31.7 mmHg — ABNORMAL LOW (ref 35.0–45.0)
pCO2 arterial: 34.6 mmHg — ABNORMAL LOW (ref 35.0–45.0)
pH, Arterial: 7.404 (ref 7.350–7.450)
pH, Arterial: 7.478 — ABNORMAL HIGH (ref 7.350–7.450)
pO2, Arterial: 103 mmHg — ABNORMAL HIGH (ref 80.0–100.0)
pO2, Arterial: 78.4 mmHg — ABNORMAL LOW (ref 80.0–100.0)
pO2, Arterial: 92.8 mmHg (ref 80.0–100.0)

## 2010-04-12 LAB — URINALYSIS, ROUTINE W REFLEX MICROSCOPIC
Hgb urine dipstick: NEGATIVE
Ketones, ur: NEGATIVE mg/dL
Protein, ur: NEGATIVE mg/dL
Urobilinogen, UA: 1 mg/dL (ref 0.0–1.0)

## 2010-04-12 LAB — CBC
HCT: 31.6 % — ABNORMAL LOW (ref 39.0–52.0)
HCT: 32.7 % — ABNORMAL LOW (ref 39.0–52.0)
HCT: 36.7 % — ABNORMAL LOW (ref 39.0–52.0)
HCT: 39.8 % (ref 39.0–52.0)
HCT: 41.1 % (ref 39.0–52.0)
Hemoglobin: 10.6 g/dL — ABNORMAL LOW (ref 13.0–17.0)
Hemoglobin: 10.7 g/dL — ABNORMAL LOW (ref 13.0–17.0)
Hemoglobin: 12.5 g/dL — ABNORMAL LOW (ref 13.0–17.0)
Hemoglobin: 13.5 g/dL (ref 13.0–17.0)
Hemoglobin: 13.8 g/dL (ref 13.0–17.0)
MCH: 33.4 pg (ref 26.0–34.0)
MCH: 33.4 pg (ref 26.0–34.0)
MCH: 33.5 pg (ref 26.0–34.0)
MCH: 33.5 pg (ref 26.0–34.0)
MCH: 33.7 pg (ref 26.0–34.0)
MCHC: 33.7 g/dL (ref 30.0–36.0)
MCHC: 33.8 g/dL (ref 30.0–36.0)
MCHC: 34.3 g/dL (ref 30.0–36.0)
MCV: 98.5 fL (ref 78.0–100.0)
MCV: 98.6 fL (ref 78.0–100.0)
MCV: 99.2 fL (ref 78.0–100.0)
MCV: 99.8 fL (ref 78.0–100.0)
Platelets: 176 10*3/uL (ref 150–400)
Platelets: 187 10*3/uL (ref 150–400)
Platelets: 191 10*3/uL (ref 150–400)
Platelets: 250 10*3/uL (ref 150–400)
RBC: 3.17 MIL/uL — ABNORMAL LOW (ref 4.22–5.81)
RBC: 3.23 MIL/uL — ABNORMAL LOW (ref 4.22–5.81)
RBC: 3.28 MIL/uL — ABNORMAL LOW (ref 4.22–5.81)
RBC: 3.67 MIL/uL — ABNORMAL LOW (ref 4.22–5.81)
RBC: 4.04 MIL/uL — ABNORMAL LOW (ref 4.22–5.81)
RBC: 4.09 MIL/uL — ABNORMAL LOW (ref 4.22–5.81)
RDW: 13.6 % (ref 11.5–15.5)
RDW: 13.7 % (ref 11.5–15.5)
RDW: 14 % (ref 11.5–15.5)
WBC: 11 10*3/uL — ABNORMAL HIGH (ref 4.0–10.5)
WBC: 11.9 10*3/uL — ABNORMAL HIGH (ref 4.0–10.5)
WBC: 13.3 10*3/uL — ABNORMAL HIGH (ref 4.0–10.5)
WBC: 14.1 10*3/uL — ABNORMAL HIGH (ref 4.0–10.5)
WBC: 14.2 10*3/uL — ABNORMAL HIGH (ref 4.0–10.5)
WBC: 17.9 10*3/uL — ABNORMAL HIGH (ref 4.0–10.5)

## 2010-04-12 LAB — CARDIAC PANEL(CRET KIN+CKTOT+MB+TROPI)
CK, MB: 1.6 ng/mL (ref 0.3–4.0)
CK, MB: 22.8 ng/mL (ref 0.3–4.0)
CK, MB: 47.4 ng/mL (ref 0.3–4.0)
CK, MB: 57.2 ng/mL (ref 0.3–4.0)
CK, MB: 66.6 ng/mL (ref 0.3–4.0)
Relative Index: 2.5 (ref 0.0–2.5)
Relative Index: 2.5 (ref 0.0–2.5)
Relative Index: INVALID (ref 0.0–2.5)
Relative Index: INVALID (ref 0.0–2.5)
Total CK: 1886 U/L — ABNORMAL HIGH (ref 7–232)
Total CK: 2470 U/L — ABNORMAL HIGH (ref 7–232)
Total CK: 71 U/L (ref 7–232)
Total CK: 913 U/L — ABNORMAL HIGH (ref 7–232)
Troponin I: 10.4 ng/mL (ref 0.00–0.06)
Troponin I: 7.8 ng/mL (ref 0.00–0.06)

## 2010-04-12 LAB — BASIC METABOLIC PANEL
CO2: 20 mEq/L (ref 19–32)
CO2: 30 mEq/L (ref 19–32)
Chloride: 104 mEq/L (ref 96–112)
Chloride: 104 mEq/L (ref 96–112)
GFR calc Af Amer: 7 mL/min — ABNORMAL LOW (ref >60)
GFR calc Af Amer: >60 mL/min (ref >60)
Potassium: 3.8 mEq/L (ref 3.5–5.1)
Potassium: 4.2 mEq/L (ref 3.5–5.1)
Sodium: 136 mEq/L (ref 135–145)
Sodium: 139 mEq/L (ref 135–145)

## 2010-04-12 LAB — CROSSMATCH
ABO/RH(D): A POS
Antibody Screen: NEGATIVE

## 2010-04-12 LAB — HEPATITIS B SURFACE ANTIGEN: Hepatitis B Surface Ag: NEGATIVE

## 2010-04-12 LAB — GLUCOSE, CAPILLARY
Glucose-Capillary: 102 mg/dL — ABNORMAL HIGH (ref 70–99)
Glucose-Capillary: 110 mg/dL — ABNORMAL HIGH (ref 70–99)
Glucose-Capillary: 115 mg/dL — ABNORMAL HIGH (ref 70–99)
Glucose-Capillary: 120 mg/dL — ABNORMAL HIGH (ref 70–99)
Glucose-Capillary: 127 mg/dL — ABNORMAL HIGH (ref 70–99)
Glucose-Capillary: 129 mg/dL — ABNORMAL HIGH (ref 70–99)
Glucose-Capillary: 133 mg/dL — ABNORMAL HIGH (ref 70–99)
Glucose-Capillary: 133 mg/dL — ABNORMAL HIGH (ref 70–99)
Glucose-Capillary: 133 mg/dL — ABNORMAL HIGH (ref 70–99)
Glucose-Capillary: 133 mg/dL — ABNORMAL HIGH (ref 70–99)
Glucose-Capillary: 134 mg/dL — ABNORMAL HIGH (ref 70–99)
Glucose-Capillary: 137 mg/dL — ABNORMAL HIGH (ref 70–99)
Glucose-Capillary: 140 mg/dL — ABNORMAL HIGH (ref 70–99)
Glucose-Capillary: 142 mg/dL — ABNORMAL HIGH (ref 70–99)
Glucose-Capillary: 145 mg/dL — ABNORMAL HIGH (ref 70–99)
Glucose-Capillary: 145 mg/dL — ABNORMAL HIGH (ref 70–99)
Glucose-Capillary: 146 mg/dL — ABNORMAL HIGH (ref 70–99)
Glucose-Capillary: 150 mg/dL — ABNORMAL HIGH (ref 70–99)
Glucose-Capillary: 164 mg/dL — ABNORMAL HIGH (ref 70–99)
Glucose-Capillary: 177 mg/dL — ABNORMAL HIGH (ref 70–99)
Glucose-Capillary: 180 mg/dL — ABNORMAL HIGH (ref 70–99)
Glucose-Capillary: 184 mg/dL — ABNORMAL HIGH (ref 70–99)
Glucose-Capillary: 187 mg/dL — ABNORMAL HIGH (ref 70–99)
Glucose-Capillary: 246 mg/dL — ABNORMAL HIGH (ref 70–99)
Glucose-Capillary: 282 mg/dL — ABNORMAL HIGH (ref 70–99)
Glucose-Capillary: 399 mg/dL — ABNORMAL HIGH (ref 70–99)
Glucose-Capillary: 82 mg/dL (ref 70–99)
Glucose-Capillary: 85 mg/dL (ref 70–99)
Glucose-Capillary: 95 mg/dL (ref 70–99)
Glucose-Capillary: 99 mg/dL (ref 70–99)

## 2010-04-12 LAB — COMPREHENSIVE METABOLIC PANEL
ALT: 104 U/L — ABNORMAL HIGH (ref 0–53)
ALT: 85 U/L — ABNORMAL HIGH (ref 0–53)
ALT: 9 U/L (ref 0–53)
AST: 152 U/L — ABNORMAL HIGH (ref 0–37)
AST: 154 U/L — ABNORMAL HIGH (ref 0–37)
AST: 41 U/L — ABNORMAL HIGH (ref 0–37)
AST: 417 U/L — ABNORMAL HIGH (ref 0–37)
AST: 69 U/L — ABNORMAL HIGH (ref 0–37)
Albumin: 2.2 g/dL — ABNORMAL LOW (ref 3.5–5.2)
Albumin: 3.3 g/dL — ABNORMAL LOW (ref 3.5–5.2)
Alkaline Phosphatase: 102 U/L (ref 39–117)
Alkaline Phosphatase: 168 U/L — ABNORMAL HIGH (ref 39–117)
Alkaline Phosphatase: 63 U/L (ref 39–117)
BUN: 82 mg/dL — ABNORMAL HIGH (ref 6–23)
CO2: 18 mEq/L — ABNORMAL LOW (ref 19–32)
Calcium: 8.2 mg/dL — ABNORMAL LOW (ref 8.4–10.5)
Calcium: 9.1 mg/dL (ref 8.4–10.5)
Chloride: 101 mEq/L (ref 96–112)
Chloride: 109 mEq/L (ref 96–112)
Chloride: 110 mEq/L (ref 96–112)
Chloride: 97 mEq/L (ref 96–112)
Creatinine, Ser: 2.37 mg/dL — ABNORMAL HIGH (ref 0.4–1.5)
Creatinine, Ser: 9.43 mg/dL — ABNORMAL HIGH (ref 0.4–1.5)
GFR calc Af Amer: 13 mL/min — ABNORMAL LOW (ref >60)
GFR calc Af Amer: 33 mL/min — ABNORMAL LOW (ref >60)
GFR calc Af Amer: 58 mL/min — ABNORMAL LOW (ref >60)
GFR calc Af Amer: 7 mL/min — ABNORMAL LOW (ref >60)
GFR calc Af Amer: 8 mL/min — ABNORMAL LOW (ref >60)
GFR calc non Af Amer: 11 mL/min — ABNORMAL LOW (ref >60)
GFR calc non Af Amer: 7 mL/min — ABNORMAL LOW (ref >60)
Glucose, Bld: 119 mg/dL — ABNORMAL HIGH (ref 70–99)
Glucose, Bld: 121 mg/dL — ABNORMAL HIGH (ref 70–99)
Glucose, Bld: 170 mg/dL — ABNORMAL HIGH (ref 70–99)
Glucose, Bld: 398 mg/dL — ABNORMAL HIGH (ref 70–99)
Potassium: 4.1 mEq/L (ref 3.5–5.1)
Potassium: 4.2 mEq/L (ref 3.5–5.1)
Potassium: 4.3 mEq/L (ref 3.5–5.1)
Potassium: 4.4 mEq/L (ref 3.5–5.1)
Sodium: 132 mEq/L — ABNORMAL LOW (ref 135–145)
Sodium: 133 mEq/L — ABNORMAL LOW (ref 135–145)
Sodium: 142 mEq/L (ref 135–145)
Total Bilirubin: 1.3 mg/dL — ABNORMAL HIGH (ref 0.3–1.2)
Total Bilirubin: 1.5 mg/dL — ABNORMAL HIGH (ref 0.3–1.2)
Total Bilirubin: 3.1 mg/dL — ABNORMAL HIGH (ref 0.3–1.2)
Total Bilirubin: 4.8 mg/dL — ABNORMAL HIGH (ref 0.3–1.2)
Total Protein: 5.2 g/dL — ABNORMAL LOW (ref 6.0–8.3)
Total Protein: 6.5 g/dL (ref 6.0–8.3)
Total Protein: 7 g/dL (ref 6.0–8.3)

## 2010-04-12 LAB — HEPATITIS B CORE ANTIBODY, TOTAL: Hep B Core Total Ab: NEGATIVE

## 2010-04-12 LAB — HEMOGLOBIN A1C
Hgb A1c MFr Bld: 6.2 % — ABNORMAL HIGH (ref <5.7)
Mean Plasma Glucose: 131 mg/dL — ABNORMAL HIGH (ref <117)

## 2010-04-12 LAB — DIFFERENTIAL
Basophils Absolute: 0 10*3/uL (ref 0.0–0.1)
Basophils Relative: 0 % (ref 0–1)
Eosinophils Absolute: 0.2 10*3/uL (ref 0.0–0.7)
Eosinophils Relative: 1 % (ref 0–5)
Lymphocytes Relative: 6 % — ABNORMAL LOW (ref 12–46)
Monocytes Absolute: 0.9 10*3/uL (ref 0.1–1.0)
Monocytes Absolute: 1.2 10*3/uL — ABNORMAL HIGH (ref 0.1–1.0)
Monocytes Relative: 10 % (ref 3–12)
Monocytes Relative: 8 % (ref 3–12)

## 2010-04-12 LAB — HEPATIC FUNCTION PANEL
ALT: 18 U/L (ref 0–53)
Albumin: 3.8 g/dL (ref 3.5–5.2)
Alkaline Phosphatase: 43 U/L (ref 39–117)
Indirect Bilirubin: 0.5 mg/dL (ref 0.3–0.9)
Total Bilirubin: 0.7 mg/dL (ref 0.3–1.2)

## 2010-04-12 LAB — PROTIME-INR
INR: 1.41 (ref 0.00–1.49)
INR: 1.44 (ref 0.00–1.49)
INR: 1.58 — ABNORMAL HIGH (ref 0.00–1.49)
INR: 1.81 — ABNORMAL HIGH (ref 0.00–1.49)
INR: 2 — ABNORMAL HIGH (ref 0.00–1.49)
Prothrombin Time: 17.7 seconds — ABNORMAL HIGH (ref 11.6–15.2)
Prothrombin Time: 20.8 seconds — ABNORMAL HIGH (ref 11.6–15.2)

## 2010-04-12 LAB — CK TOTAL AND CKMB (NOT AT ARMC)
CK, MB: 1.7 ng/mL (ref 0.3–4.0)
Relative Index: INVALID (ref 0.0–2.5)
Total CK: 76 U/L (ref 7–232)

## 2010-04-12 LAB — LIPASE, BLOOD
Lipase: 35 U/L (ref 11–59)
Lipase: 76 U/L — ABNORMAL HIGH (ref 11–59)

## 2010-04-12 LAB — PREPARE FRESH FROZEN PLASMA

## 2010-04-12 LAB — CULTURE, BAL-QUANTITATIVE W GRAM STAIN: Gram Stain: NONE SEEN

## 2010-04-12 LAB — PROCALCITONIN: Procalcitonin: >200 ng/mL

## 2010-04-12 LAB — POCT I-STAT, CHEM 8
BUN: 24 mg/dL — ABNORMAL HIGH (ref 6–23)
Calcium, Ion: 1.14 mmol/L (ref 1.12–1.32)
Chloride: 107 mEq/L (ref 96–112)
Creatinine, Ser: 1 mg/dL (ref 0.4–1.5)
TCO2: 26 mmol/L (ref 0–100)

## 2010-04-12 LAB — ANAEROBIC CULTURE

## 2010-04-12 LAB — POCT CARDIAC MARKERS: CKMB, poc: 1.1 ng/mL (ref 1.0–8.0)

## 2010-04-12 LAB — LACTIC ACID, PLASMA
Lactic Acid, Venous: 0.9 mmol/L (ref 0.5–2.2)
Lactic Acid, Venous: 5.3 mmol/L — ABNORMAL HIGH (ref 0.5–2.2)

## 2010-04-12 LAB — ALT: ALT: 16 U/L (ref 0–53)

## 2010-04-12 LAB — CULTURE, ROUTINE-ABSCESS

## 2010-04-12 LAB — PHOSPHORUS: Phosphorus: 8.3 mg/dL — ABNORMAL HIGH (ref 2.3–4.6)

## 2010-04-12 LAB — ABO/RH: ABO/RH(D): A POS

## 2010-04-15 ENCOUNTER — Ambulatory Visit (INDEPENDENT_AMBULATORY_CARE_PROVIDER_SITE_OTHER): Payer: PRIVATE HEALTH INSURANCE | Admitting: *Deleted

## 2010-04-15 DIAGNOSIS — I4891 Unspecified atrial fibrillation: Secondary | ICD-10-CM

## 2010-04-15 DIAGNOSIS — Z7901 Long term (current) use of anticoagulants: Secondary | ICD-10-CM

## 2010-04-15 NOTE — Patient Instructions (Signed)
INR 3.4 Skip today's dose, then resume taking 7.5 mg daily, except take 5 mg on Wednesdays. Recheck in 3 weeks. Eat a little extra dark green leafy veggies.

## 2010-05-06 ENCOUNTER — Ambulatory Visit (INDEPENDENT_AMBULATORY_CARE_PROVIDER_SITE_OTHER): Payer: PRIVATE HEALTH INSURANCE | Admitting: *Deleted

## 2010-05-06 DIAGNOSIS — I4891 Unspecified atrial fibrillation: Secondary | ICD-10-CM

## 2010-05-06 DIAGNOSIS — Z7901 Long term (current) use of anticoagulants: Secondary | ICD-10-CM

## 2010-05-06 LAB — POCT INR: INR: 3.1

## 2010-05-06 NOTE — Patient Instructions (Addendum)
INR 3.1  Coumadin 7.5mg  tab each days EXCEPT Coumadin 5mg  tabs on WED AND SAT Return in 2 weeks Wed April 25 at [facility]

## 2010-05-20 ENCOUNTER — Ambulatory Visit (INDEPENDENT_AMBULATORY_CARE_PROVIDER_SITE_OTHER): Payer: Medicare Other | Admitting: *Deleted

## 2010-05-20 DIAGNOSIS — I4891 Unspecified atrial fibrillation: Secondary | ICD-10-CM

## 2010-05-20 LAB — POCT INR: INR: 1.8

## 2010-06-03 ENCOUNTER — Ambulatory Visit (INDEPENDENT_AMBULATORY_CARE_PROVIDER_SITE_OTHER): Payer: Medicare Other | Admitting: *Deleted

## 2010-06-03 DIAGNOSIS — I4891 Unspecified atrial fibrillation: Secondary | ICD-10-CM

## 2010-06-09 NOTE — Assessment & Plan Note (Signed)
[facility]                            CARDIOLOGY OFFICE NOTE   Jordan Clayton, Jordan Clayton                      MRN:          [id number]  DATE:03/01/2007                            DOB:          12-20-1940    Jordan Clayton returns today for further manage the following issues.  1. Coronary artery disease.  His last stress Myoview was December 2006      normal left ventricular function.  No ischemia.  He is having no      symptoms of angina or ischemia.  2. Chronic atrial fib.  He is on anticoagulation and is well      controlled with beta blockade and calcium channel blocker.  3. Anticoagulation.  4. Hypertension.  5. Mixed hyperlipidemia followed by Dr. Jeannetta Nap.  His wife reports is      transaminases were up and his Crestor was stopped.  I note he is      also on 2 grams of niacin.  She says follow-up of LFTs had returned      back to normal.  I suspect combination was the culprit.  6. Type 2 diabetes followed by Dr. Jeannetta Nap.   His current meds are the same as last visit February 28, 2006 except that  he is now off of the Crestor.  He was only on 5 mg a day.   EXAM:  Today his blood pressure is 110/66, pulse 72.  He is in atrial  fib.  EKG is stable.  HEENT:  Normocephalic, atraumatic.  PERRL.  Extraocular is intact.  Sclerae are clear.  Face symmetry is normal.  Carotids are equal  bilateral bruits.  His teeth are in bad repair.  Carotids were equal  bilateral bruits.  There is no JVD.  Thyroid is not enlarged.  Trachea  is midline.  LUNGS:  Clear.  HEART:  Reveals an poorly appreciated PMI is in a regular rate and  rhythm, soft, good bowel sounds.  EXTREMITIES:  Reveal chronic edema.  The right lower extremity which is  stable.  His left lower extremity reveals 1+ edema.  Pulses were present  and palpable in the left lower extremity.  Hard to feel on the right.  NEURO:  Exam is grossly intact.   Jordan Clayton is doing well from our standpoint.  He is due  an objective  assessment with a Myoview.  He also would benefit from being on a  statin, even Crestor 5 mg a day.  I will suggest to Dr. Jeannetta Nap that he  try restarting this and halving his niacin to 1000 mg daily.  I will  leave this to his expertise as I have told his wife today.   Assuming a stress Myoview is negative.  I will see him back again in a  year.     Thomas C. Daleen Squibb, MD, [facility]  Electronically Signed    TCW/MedQ  DD: 03/01/2007  DT: 03/02/2007  Job #: [id number]

## 2010-06-12 NOTE — Discharge Summary (Signed)
[facility]  Patient:    Jordan Clayton, Jordan Clayton                      MRN: [id number] Adm. Date:  [id number] Disc. Date: 06/16/00 Attending:  Veneda Melter Dictator:   Tereso Newcomer, P.A. CC:         Hadassah Pais. Jeannetta Nap, M.D.             Shelby Dubin, Clinical Pharmacist, [facility]                           Discharge Summary  DATE OF BIRTH:  09/06/1940  DISCHARGE DIAGNOSES: 1. Coronary artery disease. 2. Status post coronary artery bypass grafting, left internal mammary artery    to left anterior descending by Ramon Dredge B. Tyrone Sage, M.D., in 1994. 3. History of percutaneous coronary intervention to left anterior descending    in 1994. 4. Chronic atrial fibrillation. 5. Supportive study participant. 6. Chronic anticoagulation therapy. 7. Hypolipoproteinemia. 8. New onset diabetes mellitus type 2 diagnosed this admission.  REASON FOR ADMISSION:  Exertional interscapular and shoulder pain felt to be secondary to CAD.  PROCEDURES PERFORMED THIS ADMISSION:  Cardiac catheterization by Veneda Melter, M.D., on Jun 15, 2000, revealing left main mild, LAD occluded, mid after D1 distal via LIMA, 60% D1, left circumflex to OM 30% OM1, 50% 0M2, RCA 50% proximal and 90% mid, and LIMA to LAD patent.  Status post PTCA and stent to mid RCA, reducing 90% stenosis to 0%.  Right femoral artery closed with Angio-Seal.  HISTORY OF PRESENT ILLNESS:  This 70 year old male was originally seen in the office on Jun 10, 1999, with complaints of chest discomfort x 2 over the week prior.  These were exertional symptoms.  He had no associated shortness of breath, diaphoresis, arm pain, or anterior chest pain.  All of the symptoms were located along the upper shoulder area and they were exactly the same as prior to his CABG in 1994.  He has a history of LAD angioplasty in 1994 that apparently restenosed.  He then underwent single-vessel bypass per Ramon Dredge B. Tyrone Sage, M.D., on  September 30, 1992.  His last cardiac work-up was in August of 2000 that included non-ischemic Cardiolite.  PHYSICAL EXAMINATION:  In the office, his blood pressure was 120/70.  He was in atrial fibrillation with a heart rate of 81.  HEART:  Exam revealed an irregularly irregular rhythm.  S1 and S2 were normal. No murmur.  No S2.  LUNGS:  Clear to auscultation and percussion bilaterally.  ABDOMEN:  Soft without masses, hepatosplenomegaly, bruits, or tenderness.  EXTREMITIES:  Good peripheral pulses.  No edema.  HOSPITAL COURSE:  The patient was asked to stop his supportive drug on Friday prior to his catheterization on Wednesday.  He was given nitroglycerin to use as needed.  He came into [facility] on Jun 15, 2000.  He underwent cardiac catheterization as noted above by Veneda Melter, M.D.  He tolerated the procedure well and had no immediate complications.  The procedure was noted above.  The patient also had a hemoglobin A1C performed. This was elevated at 10.8.  On the morning of Jun 16, 2000, Jesse Sans. Wall, M.D., saw the patient and noted that his groin was okay and he was stable enough for discharge to home.  Dr. Daleen Squibb also started the patient on Avandia 4 mg a day due to his new  diagnosis of diabetes.  Telemetry also revealed significant bradycardia through the night on Jun 15, 2000.  Therefore, his Cardizem was decreased to 180 mg a day.  The patient will remain on Plavix 75 mg a day for a month.  Because of this, his supportive drug will be stopped while he is on Plavix and Coumadin will be started at 5 mg a day.  He can continue on aspirin 81 mg a day.  At the end of the one month when the Plavix is discontinued, Coumadin will also be discontinued and his supportive study drug will be restarted.  LABORATORY DATA:  Sodium 136, potassium 4.1, chloride 100, CO2 30, glucose 202, BUN 13, creatinine 1.  Post procedure CK 90, CK-MB 5.3.  White blood cell count  9200, hemoglobin 15.4, hematocrit 43.8, platelet count 195,000.  DISCHARGE MEDICATIONS: 1. Avandia 4 mg q.a.m. 2. Cardizem CD 180 mg q.d. 3. Plavix 75 mg q.d. for one month. 4. Lanoxin 0.25 mg q.d. 5. Niaspan 1000 mg q.h.s. 6. Accuretic 20/12.5 mg q.d. 7. Supportive study drug to be stopped for one month while on Plavix. 8. Coated aspirin 81 mg q.d. 9. Coumadin 5 mg q.d. to be taken while off of supportive study drug.  ACTIVITY:  No driving, heavy lifting, or sexual exertion for three days.  DIET:  Low-fat, low-sodium diet.  WOUND CARE:  The patient should call our office for any concerns regarding swelling, bleeding, or bruising.  SPECIAL INSTRUCTIONS:  As noted above, the patient has been instructed to stop taking supportive while he is on Plavix and he is to take Coumadin in place of supportive study drug.  The patient will be set up with a Glucometer and diabetic teaching as an outpatient.  Also, case management has been asked to come by and help the patient with affording his medications as he is a self pay.  FOLLOW-UP:  He will follow up in the Coumadin clinic on Tuesday, Jun 21, 2000, at 9:15 a.m. for INR check.  He will see Hadassah Pais. Jeannetta Nap, M.D., in one week for diabetes and he should call for an appointment.  He will follow up with Dian Queen, P.A.C., on July 01, 2000, at 9:15 a.m. DD:  06/16/00 TD:  06/16/00 Job: [id number]

## 2010-06-12 NOTE — H&P (Signed)
NAME:  Jordan Clayton, Jordan Clayton NO.:  [id number]   MEDICAL RECORD NO.:  [id number]                   PATIENT TYPE:  EMS   LOCATION:  MAJO                                 FACILITY:  [facility]   PHYSICIAN:  Vania Rea, M.D.              DATE OF BIRTH:  1940/12/28   DATE OF ADMISSION:  07/26/2003  DATE OF DISCHARGE:                                HISTORY & PHYSICAL   PRIMARY CARE PHYSICIAN:  Dr. Windle Guard, M.D.   CHIEF COMPLAINT:  Swelling and pain of the right lower extremity.   HISTORY OF PRESENT ILLNESS:  This is a 70 year old Caucasian man with a  history of coronary artery disease, atrial fibrillation and diabetes who is  maintained on chronic Coumadin and has a history of  Vibrio vulnificus  cellulitis of the right lower-extremity two years ago which left him with  residual recurrent swelling of the right lower extremity.  The patient has  been in his baseline state until about three weeks ago.  His wife noted  there is worsening of the recurrent swelling of his right leg, and swelling  of the right foot.  Today after a visit to the beach, the right foot and leg  became very painful and more swollen, and the patient came to the emergency  room where he was noted to have a temperature of 103.   The patient denies fever, cough or cold, chest pains or shortness of breath,  or any direct injury to the leg.  The patient does indicate some financial  difficulty but also indicates that he has been taking his medications  regularly.   PAST MEDICAL HISTORY:  1. History of Vibrio infection of the right lower extremity two years ago.  2. 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  3. Coronary artery disease, status post coronary artery bypass graft in     1994.  4. Status post coronary artery stenting in 2002.  5. Chronic atrial fibrillation on Coumadin therapy.  6. Diabetes x2.  7. Hyperlipidemia.  8. History of  hyperthyroidism treated with radioactive ablation in 1994.   MEDICATIONS:  1. Digoxin 0.25 mg daily.  2. Cardizem 240 mg daily.  3. Coumadin 7.5 mg Monday, Wednesday and Friday, and 5 mg other days.  4. Avandia 8 mg daily.  5. Niacin extended release 2 grams daily.  6. Quinapril 20 mg daily.   ALLERGIES:  No known drug allergies.   SOCIAL HISTORY:  Lives with his wife of 40 odd years.  Denies tobacco,  alcohol or illicit drug use.  He is a retired Naval architect, and apparently  has no Systems developer which seems to be a concern to him.   FAMILY HISTORY:  Significant for both parents dying of strokes.  He has  about 13 siblings, and high blood pressure and heart disease is very  prevalent.  He is the only one with diabetes.  He has two children in their  30's who are in good health.   REVIEW OF SYSTEMS:  Negative for anything related to the CENTRAL NERVOUS  SYSTEM, CARDIOVASCULAR SYSTEM/ RESPIRATORY SYSTEM, ABDOMINAL SYSTEM,  GENITOURINARY SYSTEM OR MUSCULOSKELETAL SYSTEM other than what has already  been described in the HPI, and past medical history.  There is no history of  depression or anxiety.   PHYSICAL EXAMINATION:  GENERAL:  This is a very well-built, elderly  Caucasian man who looks very tanned, lying in bed, seems to be fairly  comfortable.  VITAL SIGNS:  T-max in the emergency room was 103.0 orally, currently 100.2.  Blood pressure is 132/53.  Pulse 91.  Respirations 24.  He is saturating at  94%.  HEENT:  Pupils are round, equal and reactive to light.  He has a thick neck.  There is no obvious thyromegaly.  CHEST:  Clear to auscultation bilaterally.  CARDIOVASCULAR SYSTEM:  He has an irregular, irregular rhythm.  ABDOMEN:  Obese, soft, and nontender.  EXTREMITIES:  He has 3+ soft pitting edema of the right lower extremity to  the knee.  The foot is tender.  The left lower extremity is without edema.  There is no deformity of the joints.  CNS:  Cranial  nerves are grossly intact.  There are no focal deficits.   LABORATORY DATA:  White count 16.8, his hematocrit is 42.3, platelet count  200, 90% neutrophils.  Sodium 141.  Potassium 4.0, chloride 106.  CO2 27.  Glucose 113.  BUN 19, creatinine 1.1.  Calcium 9.6.  AST is 34.  ALT 46.  Alkaline phosphatase 31.  Total bilirubin 1.3, just slightly elevated.  His  D-Dimer is less than 0.22.  His INR is 2.6.  His urinalysis is bland.   EKG shows atrial fibrillation, blood cultures have been drawn.   ASSESSMENT:  1. This is a gentleman with cellulitis of the right lower extremity, rule     out osteomyelitis.  2. Atrial fibrillation with a controlled rate.  3. Coronary artery disease, with no evidence of a cardiac decompensation.  4. Diabetes mellitus, controlled.  5. History of hyperthyroidism, status post radioactive ablation.  6. Obesity.   PLAN:  1. We will continue him on Avelox IV as has apparently been recommended by     Dr. Lina Sayre in consultation with the emergency room physician.  2. We will add Protonix to his medications, and withhold NSAID's, and use     Vicodin for pain.  3. Continue him on his Coumadin regimen and monitor his INR daily.  4. Continue diltiazem for his atrial fibrillation.  5. Continue his Avandia and his digoxin and his Niacin.  However, we will be     checking his A1c, a fasting lipid profile and thyroid function studies.  6. We will also get an x-ray of leg and 4th toe, rule out osteomyelitis.                                                Vania Rea, M.D.   LC/MEDQ  D:  07/26/2003  T:  07/27/2003  Job:  [id number]

## 2010-06-12 NOTE — Op Note (Signed)
NAME:  Jordan Clayton, Jordan Clayton NO.:  [id number]   MEDICAL RECORD NO.:  [id number]                   PATIENT TYPE:  INP   LOCATION:  4729                                 FACILITY:  [facility]   PHYSICIAN:  Hettie Holstein, D.O.                 DATE OF BIRTH:  01/09/1941   DATE OF PROCEDURE:  DATE OF DISCHARGE:  07/30/2003                                 OPERATIVE REPORT   ADMISSION DIAGNOSES:  1. Cellulitis of the right lower extremity.  2. Atrial fibrillation, rate controlled.  3. Coronary artery disease.  4. Diabetes mellitus.  5. Hyperthyroidism, status post RAI.  6. Obesity.   DISCHARGE DIAGNOSES:  1. Cellulitis of the right lower extremity.  2. Atrial fibrillation, rate controlled.  3. Coronary artery disease.  4. Diabetes mellitus.  5. Hyperthyroidism, status post RAI.  6. Obesity.  7. 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  8. History of vibrio mimicus infection.  9. Suboptimal management of diabetes.   DISCHARGE MEDICATIONS:  Patient was instructed to continue medications he  was on at admission including Digoxin, Cardizem, Coumadin, Avandia, niacin  and Quinapril.  His INR was therapeutic on discharge at 2.5.   DISPOSITION:  The patient was discharged on Cipro 500 mg twice daily for 14  days and was recommended follow-up with his primary care physician in a week  and also instructed to have follow-up CT scan of his abdomen on July 11 at  11:00 a.m.   CONSULTATIONS:  Cliffton Asters, M.D.   HISTORY OF PRESENT ILLNESS:  For full details see H&P as dictated by Dr.  Vania Rea; however, briefly this is a 70 year old Caucasian male with  history of coronary artery disease, atrial fibrillation and diabetes, who  his maintained on chronic Coumadin and has history of vibrio mimicus  cellulitis of the right lower extremity two years ago which left him with  residual recurrent swelling of the  right lower extremity.  He had been in  his usual baseline state of health until about three weeks previously.  His  wife noted that there was worsening of  recurrent swelling in his right leg  and swelling in the right foot. Day of presentation after a visit to the  beach, his right foot and leg became very painful and swollen and he was  noted to have a temperature of 103.  He was admitted for further evaluation  and management.   HOSPITAL COURSE:  He was started on Avalox IV.  Infectious disease was  consulted and he was continued on his home medications.  Radiographs were  ordered and revealed no suggestion of osteomyelitis.  Blood cultures were  drawn.  Subsequently, revealed Serratia bacteremia.  His antibiotics were  adjusted based on sensitivities of culture and he was discharged on Cipro.  His clinical course was unremarkable with evaluation including lower  extremity Doppler studies that were not revealing of deep venous thrombosis  as well as a 2D echocardiogram that revealed left ventricular ejection  fraction of 55 to 65% and mildly dilated left atrium and mildly dilated  right atrium.  He was instructed to follow up with primary care physician as  needed.                                               Hettie Holstein, D.O.    ESS/MEDQ  D:  09/04/2003  T:  09/04/2003  Job:  [id number]   cc:   Windle Guard, M.D.  [address]  [place], [place] [address]  Fax: [phone]

## 2010-06-12 NOTE — Assessment & Plan Note (Signed)
[facility]                              CARDIOLOGY OFFICE NOTE   DEON, DUER                      MRN:          [id number]  DATE:08/24/2005                            DOB:          [birth year]    Mr. Cripps returns today for further management of the following issues:  1.  Coronary artery disease.  Negative stress Myoview, December 2006.  EF      could not be calculated because of atrial fib.  He is due a followup      echo.  He has a history of an EF around 50-55% by cath, 2006.  2.  Chronic atrial fibrillation.  His rate is elevated today into the 90s.      He has been having some atypical chest pain and fatigue and a little bit      of shortness of breath.  3.  Anticoagulation.  4.  Hypertension.  5.  Mixed hyperlipidemia that was checked by Dr. Jeannetta Nap, where a goal recent      was set for an HDL of 36.  6.  Type 2 diabetes.  Avandia was stopped 2 months ago with the warnings.      His blood sugars run about 90 to 120 according to his wife.   MEDICATIONS:  Outlined in the maintenance medication list.  Just looking at  his list, he can stop vitamin E.  He can stop Foltx.  And, he can stop  vitamin C.  Coenzyme-Q10 has very questionable value as well.  It is very  expensive.   PHYSICAL EXAMINATION:  VITAL SIGNS:  His blood pressure is 130/80.  His  pulse is 95 and irregular.  EKG confirms atrial fib with no ST segment  changes.  Weight is 247 and stable.  NECK:  Carotids are full and no JVD.  There is no bruit.  LUNGS:  Clear.  HEART:  Reveals a soft S1 S2 that is irregular.  ABDOMEN:  Soft with good bowel sounds.  There is no midline bruit.  There is  no hepatomegaly.  EXTREMITIES:  Reveal 4+ edema in the right lower extremity which is chronic,  trace on the left.  Pulses were present in both lower extremities, left  greater than right.   ASSESSMENT:  Mr. Schnoor heart rate at rest is too high with his history of  coronary disease  as well as atrial fibrillation.   PLAN:  1.  I have added Metoprolol CR 50 mg a day.  He will continue his diltiazem      CD 180 mg a day.  2.  I will schedule him for a 2D echocardiogram to check LV function.  We      will also check an EKG that day for rate and also a blood pressure      check.  3.  Assuming these are stable and his heart rate is down into the 70s, we      will plan on seeing him back in December.  Thomas C. Daleen Squibb, MD, [facility]    TCW/MedQ  DD:  08/24/2005  DT:  08/24/2005  Job #:  [id number]   cc:   Windle Guard, MD

## 2010-06-12 NOTE — Assessment & Plan Note (Signed)
[facility]                            CARDIOLOGY OFFICE NOTE   ALEXI, GEIBEL                      MRN:          [id number]  DATE:02/28/2006                            DOB:          1940-09-10    Mr. Barretto returns today for further assessment on the following issues.  1. Coronary artery disease.  He had a negative stress Myoview December      2006.  He has normal left ventricular systolic function.  EF is      around 50-55% by catheterization and also 2-D echocardiogram, last      obtained on August 31, 2005.  2. Chronic atrial fibrillation.  His rate has been high in the past,      and I added metoprolol extended release 50 mg a day. He is not even      sure he is taking this.  He should also be on Cardizem CD 180.  He      will call and check with [place] when he gets home.  He denies any tachy      palpitations.  He has had no chest pain or dyspnea on exertion.  3. Anticoagulation.  4. Hypertension.  5. Mixed hyperlipidemia followed by Dr. Jeannetta Nap.  6. Type 2 diabetes followed by Dr. Jeannetta Nap.   MEDICATIONS:  1. Vitamin E a day.  2. Coumadin as directed.  3. Accupril 20 mg a day.  4. Niacin 1000 mg p.o. b.i.d.  5. Crestor 5 mg a day.  6. Flax seed oil once a day.  7. Cardizem CD 180 daily.  8. Aspirin 81 mg a day.  9. Foltx daily.  10.Glyburide 5 mg a day.  11.Coenzyme Q10 120 daily.  12.Vitamin C 500 mg a day.  13.Calcium with vitamin D daily.  14.Metoprolol extended release we think 50 mg a day.   PHYSICAL EXAMINATION:  VITAL SIGNS:  Blood pressure today is 132/80,  pulse 80 and regular.  Weight 250.  GENERAL:  He is in no acute distress.  SKIN: Warm and dry.  HEENT:  Normocephalic and atraumatic.  PERRLA.  Extraocular movements  intact. Sclerae clear.  Facial symmetry is normal.  NECK:  Supple.  There is no JVD.  Carotid upstrokes are equal  bilaterally without bruits.  No thyromegaly.  LUNGS:  Clear.  HEART:  Regular rate and  rhythm without gallop.  ABDOMEN: Soft with good bowel sounds.  No midline bruit.  There is no  obvious organomegaly.  EXTREMITIES:  Reveal his right lower extremity at baseline.  Pulses were  present there.  He has 4+ edema as always. There was trace on the left.  NEUROLOGIC: Exam is intact.   Electrocardiogram demonstrates atrial fibrillation with well-controlled  ventricular rate in the 70s and low 80s.   ASSESSMENT AND PLAN:  I am delighted with how Mr. Lucarelli is doing.  He  looks good today.  I have made no changes in his program.  He will call  us back and make sure that he is taking metoprolol extended release 50  mg a day.  Otherwise, we will  see him back in December 2008.  He will  need a stress Myoview at that time.     Thomas C. Daleen Squibb, MD, [facility]  Electronically Signed    TCW/MedQ  DD: 02/28/2006  DT: 02/28/2006  Job #: [id number]

## 2010-06-12 NOTE — Discharge Summary (Signed)
NAME:  TOUA, STITES NO.:  [id number]   MEDICAL RECORD NO.:  [id number]          PATIENT TYPE:  OIB   LOCATION:  6523                         FACILITY:  [facility]   PHYSICIAN:  Carole Binning, M.D. LHCDATE OF BIRTH:  Jul 01, 1940   DATE OF ADMISSION:  06/03/2004  DATE OF DISCHARGE:  06/03/2004                                 DISCHARGE SUMMARY   PROCEDURES:  1.  Cardiac catheterization  2.  Coronary arteriogram.  3.  Left ventriculogram.  4.  Left internal mammary artery arteriogram.  5.  Bare-metal stent x2.   DISCHARGE DIAGNOSES:  1.  Unstable anginal pain, status post stress Cardiolite, May 25, 2004, with      abnormality in the distal inferior wall and apex, ejection fraction 56%,      status post cardiac catheterization with percutaneous transluminal      coronary angioplasty and a bare-metal stent to the right posterolateral      artery as well as percutaneous transluminal coronary angioplasty and a      bare-metal stent to proximal right coronary artery as well as      percutaneous transluminal coronary angioplasty to the obtuse marginal 1.      (Residual 70% circumflex, medical therapy).  2.  Preserved left ventricular function with an ejection fraction of 55% to      60% and 1+ mitral regurgitation at catheterization.  3.  Chronic atrial fibrillation, digoxin discontinued for heart rate in the      40s, some bradycardia overnight while asleep, but asymptomatic,      outpatient Holter monitor if patient symptomatic  4.  Abnormal chest x-ray with vague nodular density in the right lung base,      followup chest x-ray recommended at 3 months  5.  Anticoagulation with Coumadin, INR 1.1 at discharge and Coumadin clinic      appointment next Tuesday.  6.  Mixed hyperlipidemia.  7.  Status post aortocoronary bypass surgery in 1993 with left internal      mammary artery to left anterior descending.  8.  Status post percutaneous transluminal coronary  angioplasty and stent to      the right coronary artery in May of 2002  9.  Intolerance to Lipitor.   HOSPITAL COURSE:  Mr. Eastham is a 70 year old male with a history of coronary  artery disease.  He had been having some anginal symptoms and was seen by  Dr. Daleen Squibb, who ordered a stress test.  His stress test was abnormal and he  was referred for cath.  The patient was admitted for this on Jun 03, 2004.   Mr. Decicco had intervention on the RCA, PLA and OM-1.  Post procedure, he had  an enzyme elevation with a peak CK-MB of 142/13.3, so he was held an  additional 24 hours.  He had no further episodes of chest pain and felt  well.   Mr. Mauritz is in atrial fibrillation.  His heart rate was sustaining in the  40s and his digoxin was discontinued.  He dropped briefly into the 30 silk,  but this was  not sustained.  He was sustaining right at or below 50 while  asleep, but his heart rate was in the mid-50s to 80s while awake.  He is to  follow up with Dr. Daleen Squibb in the office.  He will be continued on his  Cardizem.   Mr. Sitar was ambulating without chest pain or shortness of breath and is  considered stable for discharge on Jun 05, 2004.   DISCHARGE INSTRUCTIONS:  1.  His activity level is to include no driving for 3 days and no strenuous      activity for a week.  2.  He is to call the office for problems with the cath site.  3.  He is to stick to a low-fat diabetic diet.  4.  He is to follow up with the Coumadin clinic on Tuesday.  5.  He is to follow up with Dr. Daleen Squibb, Jun 18, 2004, and with Dr. Jeannetta Nap as      scheduled.   DISCHARGE MEDICATIONS:  1.  Coated aspirin 325 mg daily for 5 days, then 81 mg daily.  2.  Plavix 75 mg per day for a month.  3.  Coumadin as prior to admission.  4.  Lanoxin was discontinued.  5.  Vitamin C, coenzyme Q10, flaxseed oil and Foltx as prior to admission.  6.  Cardizem CD 180 mg daily.  7.  Accupril 20 mg daily.  8.  Avandia 8 mg daily.  9.  Niacin  1000 mg b.i.d.  10. Glyburide 15 mg a day.  11. Crestor 5 mg daily.  12. Lasix 40 mg p.o. p.r.n.  13. Prednisone 10 mg as prior to admission.  14. Sublingual nitroglycerin p.r.n..      RB/MEDQ  D:  06/05/2004  T:  06/05/2004  Job:  [id number]   cc:   Windle Guard, M.D.  [address]  [place], [place] [address]  Fax: [phone]   Jesse Sans. Wall, M.D.

## 2010-06-12 NOTE — Cardiovascular Report (Signed)
NAME:  Jordan Clayton, Jordan Clayton NO.:  [id number]   MEDICAL RECORD NO.:  [id number]          PATIENT TYPE:  OIB   LOCATION:  6523                         FACILITY:  [facility]   PHYSICIAN:  Carole Binning, M.D. LHCDATE OF BIRTH:  January 21, 1941   DATE OF PROCEDURE:  06/03/2004  DATE OF DISCHARGE:                              CARDIAC CATHETERIZATION   PROCEDURES:  1.  Left heart catheterization with coronary angiography.  2.  Bypass graft angiography and left ventriculography.  3.  Percutaneous transluminal coronary angioplasty with stent placement in      the second posterolateral branch.  4.  Percutaneous transluminal coronary angioplasty with stent placement in      the proximal right coronary artery.  5.  Percutaneous transluminal coronary angioplasty of the first obtuse      marginal branch.   INDICATIONS:  The patient is a 70 year old male with history of coronary  artery disease and prior single vessel coronary artery bypass graft surgery  with a left internal mammary artery to the LAD. He has been having symptoms  of exertional angina. He recently underwent an Adenosine-Myoview study which  showed mild ischemia in the mid-anterior wall as well as ischemia in the  distal inferior wall and apex. He was therefore referred for cardiac  catheterization.   DESCRIPTION OF PROCEDURE:  A 6-French sheath was placed in the right femoral  artery. Coronary angiography was performed with a 6-French JL4 and JR4  catheters. The left internal mammary artery was imaged with an internal  mammary catheter. Left ventriculography was performed with an angled pigtail  catheter. Contrast was Omnipaque. There was no complications.   HEMODYNAMIC DATA:  1.  Left ventricular pressure 118/12.  2.  Aortic pressure 118/56.  3.  There is no aortic valve gradient.   LEFT VENTRICULOGRAM:  There is mild hypokinesis of the mid-anterior wall.  Ejection fraction is estimated at 55-60%. There is 1+ mild  mitral  regurgitation.   CORONARY ANGIOGRAPHY:  1.  Left main is normal.  2.  Left anterior descending artery is 100% occluded in the mid-vessel just      after the first diagonal branch. The first diagonal branch itself is a      large vessel with a 50% stenosis proximally. The distal LAD is filled      via a left internal mammary graft.  3.  Left circumflex gives rise to a small caliber but fairly long first      obtuse marginal and a normal size second obtuse marginal branch. There      is a diffuse 70% stenosis in the distal circumflex. The first obtuse      marginal branch itself was 100% occluded with TIMI I flow filling the      distal vessel. Of note on previous catheterization, this did appear to      be a fairly sizeable vessel.  4.  Right coronary artery is a dominant vessel. There is moderate      calcification in the proximal to the mid-vessel. In the proximal vessel,      there is a  75% stenosis. In the mid-vessel, there is a 40% stenosis.      There is a stent in the distal right coronary which has 30% stenosis      within the stent. Beyond this, there is a 40% stenosis just prior to the      posterior descending artery. The distal right coronary artery gives to a      normal size posterior descending artery, a normal size fist      posterolateral branch, and a large second posterolateral branch. There      is a 50% stenosis in the origin of the posterior descending artery.      There is a 50% stenosis in the origin of the first posterolateral      branch. There is a 99% stenosis in the very distal right coronary artery      extending into the proximal portion of the second posterolateral branch.  5.  Left internal mammary artery to the distal LAD is patent filling the mid      and distal LAD. There is a 60% stenosis in the distal LAD beyond the      graft insertion as well as a 70% stenosis in the apical LAD.   IMPRESSION:  1.  Left ventricular systolic function  characterized by mild hypokinesis of      the anterior wall with low normal ejection fraction.  2.  Native three vessel coronary artery disease.  3.  Status post single vessel coronary artery bypass graft surgery with a      patent left internal mammary artery to the left anterior descending      artery. There is moderate disease in the distal left anterior descending      artery beyond this. There is moderate disease in the distal left      circumflex. There is significant disease in proximal portion, large      second posterolateral branch arising from the distal right coronary      artery. There is also significant disease in the very proximal right      coronary artery. In addition, there is a total occlusion of the moderate      sized first obtuse marginal branch.   PLAN:  Percutaneous coronary intervention.   PTCA PROCEDURE:  Heparin and Integrilin were administered per protocol. We  initially treated the right coronary artery. We used a 6-French JR4 guiding  catheter. We attempted to pass an [employer] soft coronary guide wire into the  distal aspect of the second posterolateral branch in the right coronary  artery; however, we could not manipulate this beyond the stenosis due to the  angulation at the site of the lesion. We then advanced an Public relations account executive and were able to advance this beyond this stenosis in the  posterolateral branch. We positioned this in the distal vessel. We then  performed PTCA of the proximal posterolateral branch with a 2.0 by 15 mm  Maverick balloon for two inflations, each to 10 atmospheres. This resulted  in improvement in the vessel lumen. At that point, the result did appear to  be adequately. We then turned our attention to the proximal right coronary  artery. We positioned a 3.0 by 20 mm Liberte bare-metal stent across the  disease in the proximal vessel and deployed the stent at 18 atmospheres. We then went back with a 3.5 x 12 mm Quantum balloon  and inflated this to 24  atmospheres in the proximal aspect of the stent  and 20 atmospheres in the  distal aspect of the stent. Angiographic images at that point revealed an  excellent result with 0% residual stenosis at the stent site and TIMI III  flow.   We therefore removed our guide wire and guiding catheter and then turned our  attention to the obtuse marginal branch. We used a 6-French CLS4 guiding  catheter. We attempted to cross the occlusion in the obtuse marginal  initially with an [facility] wire but this would not cross. We then  tried a PT-2 wire but this would also not cross. Finally, we used a Cross-it  100 wire and were able to cross the occlusion. We then performed PTCA of the  obtuse marginal with a 2.0 x 15 mm Maverick balloon. We did one inflation to  6 atmospheres and then one inflation to 8 atmospheres. Intracoronary  nitroglycerin was administered. Angiographic images at that point revealed  significant improvement with approximately 20% residual stenosis with  haziness and a possible small dissection and TIMI III flow into the distal  vessel.   Because of concern over the final appearance of the lesion in the  posterolateral branch, we went back with our 6-French JR4 guiding catheter  to take one more picture of the posterolateral branch. This revealed that  there was some recoil at the site of the PTCA with some haziness and  possible dissection. We therefore readvanced an Clinical cytogeneticist wire into the  right coronary artery beyond the stenosis in the posterolateral branch. We  then advanced a 2.0 by 15 mm min vision bare-metal stent and positioned this  across the disease segmented vessel. This stent was deployed at 8  atmospheres. Finally, we went in with a 2.0 x 12 mm Quantum balloon and  inflated this to 15 atmospheres in the distal aspect of the stent and 20  atmospheres in the proximal aspect of the stent. Final angiographic images  were obtained  revealing an excellent result with 0% residual stenosis at the  stent site and TIMI III flow in the distal vessel.   COMPLICATIONS:  None.   RESULTS:  1.  Successful percutaneous transluminal coronary angioplasty with placement      of a bare-metal stent in the proximal right coronary artery. A 75%      stenosis was reduced to 0% residual with TIMI III flow.  2.  Successful percutaneous transluminal coronary angioplasty with placement      of a bare-metal stent in the distal right coronary artery extending into      the proximal second posterolateral branch. A 99% stenosis was reduced to      0% residual with TIMI III flow.  3.  Successful percutaneous transluminal coronary angioplasty of the chronic      total occlusion of the first obtuse marginal branch. A 100% occlusion      with TIMI I flow was reduced to 20% residual with haziness and TIMI III      flow.   PLAN:  Integrilin will be continued overnight. The patient is in chronic atrial fibrillation and will be resumed back on Coumadin. He will require  treatment with aspirin and Plavix combination for one month followed by  lifelong low dose aspirin therapy.      MWP/MEDQ  D:  06/03/2004  T:  06/04/2004  Job:  [id number]   cc:   Windle Guard, M.D.  [address]  [place], [place] [address]  Fax: [phone]   Jesse Sans. Wall, M.D.

## 2010-06-12 NOTE — Cardiovascular Report (Signed)
[place]  Patient:    Jordan Clayton, Jordan Clayton                      MRN: [id number] Proc. Date: 06/15/00 Adm. Date:  [id number] Attending:  Veneda Melter CC:         Jesse Sans. Daleen Squibb, M.D. [facility]  Windle Guard, M.D.   Cardiac Catheterization  PROCEDURES PERFORMED: 1. Left heart catheterization. 2. Left ventriculogram. 3. Selective coronary angiography. 4. Percutaneous transluminal coronary angiography and stenting of the mid    right coronary artery. 5. Angio-Seal right femoral artery.  DIAGNOSES: 1. Coronary artery disease. 2. Mild left ventricular systolic dysfunction. 3. Mitral regurgitation 1+. 4. Atrial fibrillation.  HISTORY:  Jordan Clayton is a 70 year old gentleman with multiple cardiac risk factors and a known history of coronary artery disease who has previously undergone coronary artery bypass graft surgery on September 30, 1992, with placement of LIMA graft to the LAD.  This occurred after failure of angioplasty to the LAD.  The patient has done well in the interim, however, recently he has had recurrence of substernal chest discomfort which is most notable with exertion.  This has been associated with mild shortness of breath.  He presents now for further cardiac assessment.  TECHNIQUE:  Informed consent was obtained and the patient was brought to the catheterization laboratory.  A 6-French sheath was placed in the right femoral artery.  Selective angiography and left heart catheterization were then performed in the usual fashion using preformed 6-French Judkins catheters.  INITIAL FINDINGS: 1. Left main trunk: Large caliber vessel with mild irregularities. 2. Left anterior descending coronary artery: This begins as a large caliber    vessel and provides a medium caliber first diagonal branch in the proximal    segment and two smaller diagonal branches distally.  The LAD is occluded in    the mid section after the first diagonal branch.   The proximal LAD is    heavily calcified.  The distal LAD and two distal diagonal branches are    seen to fill via a LIMA graft.  There is mild diffuse disease of 30% in the    apical segment of the LAD.  The first diagonal branch has an ostial    narrowing of 60%. 3. Left circumflex artery: This is a small caliber vessel that provides two    marginal branches.  The first marginal branch has a narrowing of 30% in the    proximal segment.  The second marginal branch has a focal narrowing of 50%    in its proximal segment. 4. Right coronary artery: The right coronary artery is dominant.  It is a    large caliber vessel that provides a posterior descending artery and two    posteroventricular branches in the terminal segment.  The right coronary    artery is moderately diffusely diseased in the proximal mid section with    a focal narrowing of 50% in the proximal bend.  There is a high-grade    focal narrowing of 90% in the mid right coronary artery at the junction    of the mid and distal thirds of the vessel.  There is diffuse disease of    30% in the distal RCA.  The posterior descending artery and branch vessels    have mild irregularities.  LEFT VENTRICULOGRAPHY:  Left ventricle:  Normal end-systolic and end-diastolic dimensions.  Overall left ventricular function is mildly impaired.  Ejection fraction is  approximately 45%.  There is mild aneurysmal dilatation and hypokinesis of the mid anterior wall.  There is 1+ mitral regurgitation noted, with left atrial enlargement.  LV pressure is 105/0, aortic is 105/60, LV-EDP 10. The patient is in atrial fibrillation.  These findings were reviewed with the patient and we elected to proceed with percutaneous intervention of the distal RCA.  INTERVENTIONAL PROCEDURE:  The 6-French sheath was exchanged for a 7-French sheath.  The patient was given 300 mg of Plavix orally and intravenous heparin and Integrilin on a weight-adjusted basis to  maintain ACT of greater than 250 seconds.  A 7-French JL-4 guide catheter was used to engage the right coronary artery and a 0.014 inch Patriot wire was advanced into the distal RCA.  A 3.0 x 13 mm Penta stent was introduced and primarily deployed in the mid RCA at the area of severe 90% stenosis at 16 atm x 60 seconds.  Repeat angiography showed full coverage of the lesion.  There was mild residual waste in the mid section of the stent.  A 3.5 x 8 mm PowerSail balloon was then introduced and two inflations were then performed within the stent to postdilate it at 16 atm for 30 seconds.  Repeat angiography showed improvement in the stent diameter, with no residual stenosis.  There was a mild area of vessel disruption just distal to the stent and it was unclear whether this represented a dissection flap or atheroma.  A single inflation was performed at this site at 6 atm for 120 seconds.  Repeat angiography after the initiation of intracoronary nitroglycerin now showed an excellent result with no evidence of vessel damage or disruption.  There was TIMI-3 flow throughout the right coronary artery and no residual stenosis at the area of stenting.  Final angiography was performed in various projections confirming no vessel damage.  The guide catheter was then removed and the sheath extracted as an Angio-Seal closure device was deployed.  Adequate hemostasis was achieved and he was transferred to the floor in stable condition.  FINAL RESULT:  Successful percutaneous transluminal coronary angiography and stenting of the mid right coronary artery with reduction of a focal 90% narrowing to 0% with placement of a 3.0 x 13 mm Penta stent dilated to approximately 3.5 mm. DD:  06/15/00 TD:  06/15/00 Job: [id number]

## 2010-07-01 ENCOUNTER — Ambulatory Visit (INDEPENDENT_AMBULATORY_CARE_PROVIDER_SITE_OTHER): Payer: Medicare Other | Admitting: *Deleted

## 2010-07-01 DIAGNOSIS — I4891 Unspecified atrial fibrillation: Secondary | ICD-10-CM

## 2010-07-30 ENCOUNTER — Ambulatory Visit (INDEPENDENT_AMBULATORY_CARE_PROVIDER_SITE_OTHER): Payer: Medicare Other | Admitting: *Deleted

## 2010-07-30 DIAGNOSIS — I4891 Unspecified atrial fibrillation: Secondary | ICD-10-CM

## 2010-07-30 LAB — POCT INR: INR: 3.2

## 2010-08-21 ENCOUNTER — Encounter: Payer: Self-pay | Admitting: Cardiology

## 2010-08-26 ENCOUNTER — Ambulatory Visit (INDEPENDENT_AMBULATORY_CARE_PROVIDER_SITE_OTHER): Payer: Medicare Other | Admitting: *Deleted

## 2010-08-26 ENCOUNTER — Ambulatory Visit (INDEPENDENT_AMBULATORY_CARE_PROVIDER_SITE_OTHER): Payer: Medicare Other | Admitting: Cardiology

## 2010-08-26 ENCOUNTER — Encounter: Payer: Self-pay | Admitting: Cardiology

## 2010-08-26 VITALS — BP 125/59 | HR 77 | Resp 18 | Ht 71.0 in | Wt 224.0 lb

## 2010-08-26 DIAGNOSIS — E119 Type 2 diabetes mellitus without complications: Secondary | ICD-10-CM

## 2010-08-26 DIAGNOSIS — I214 Non-ST elevation (NSTEMI) myocardial infarction: Secondary | ICD-10-CM

## 2010-08-26 DIAGNOSIS — I4891 Unspecified atrial fibrillation: Secondary | ICD-10-CM

## 2010-08-26 LAB — CBC WITH DIFFERENTIAL/PLATELET
Basophils Relative: 0.4 % (ref 0.0–3.0)
Eosinophils Absolute: 0.3 10*3/uL (ref 0.0–0.7)
Eosinophils Relative: 3.5 % (ref 0.0–5.0)
HCT: 40.7 % (ref 39.0–52.0)
Lymphs Abs: 1.9 10*3/uL (ref 0.7–4.0)
MCHC: 32.9 g/dL (ref 30.0–36.0)
MCV: 96.9 fl (ref 78.0–100.0)
Monocytes Absolute: 1.1 10*3/uL — ABNORMAL HIGH (ref 0.1–1.0)
Platelets: 214 10*3/uL (ref 150.0–400.0)
RBC: 4.2 Mil/uL — ABNORMAL LOW (ref 4.22–5.81)
WBC: 9.1 10*3/uL (ref 4.5–10.5)

## 2010-08-26 LAB — BASIC METABOLIC PANEL
BUN: 33 mg/dL — ABNORMAL HIGH (ref 6–23)
CO2: 26 mEq/L (ref 19–32)
Chloride: 107 mEq/L (ref 96–112)
Creatinine, Ser: 1 mg/dL (ref 0.4–1.5)
Potassium: 4.6 mEq/L (ref 3.5–5.1)

## 2010-08-26 NOTE — Assessment & Plan Note (Signed)
Stable

## 2010-08-26 NOTE — Patient Instructions (Signed)
Your physician recommends that you have lab work today for a bmp and cbc. We will call you with your lab results. Your physician recommends that you schedule a follow-up appointment in: 6 months with Dr. Daleen Squibb

## 2010-08-26 NOTE — Progress Notes (Signed)
HPI 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 fib and peripheral edema.  He is slowly recovering from what he went through with sepsis. He is having no chest pain or angina. His weight has been stable. He weighs daily.  He continues to be on folic acid, B12, and thiamine. Also noted is he is not on ACE inhibitor. Last creatinine was 1.5.  EKG shows chronic A. Fib no change. Past Medical History  Diagnosis Date  . Delirium   . Hypernatremia   . Septic shock   . Acute MI     subendocardial  . CAD (coronary artery disease)   . Chronic atrial fibrillation   . Drug therapy     coumadin  . HLD (hyperlipidemia)     mixed  . HTN (hypertension)   . DM2 (diabetes mellitus, type 2)     Past Surgical History  Procedure Date  . Coronary artery bypass graft 1993    Family History  Problem Relation Age of Onset  . Coronary artery disease      family hx  . Hypertension      family hx    History   Social History  . Marital Status: Married    Spouse Name: N/A    Number of Children: N/A  . Years of Education: N/A   Occupational History  . Not on file.   Social History Main Topics  . Smoking status: Never Smoker   . Smokeless tobacco: Not on file  . Alcohol Use: No  . Drug Use: Not on file  . Sexually Active: Not on file   Other Topics Concern  . Not on file   Social History Narrative   Married; retired; disabled, does not get regular exercise.     Allergies  Allergen Reactions  . Atorvastatin   . Oxycodone-Acetaminophen     REACTION: Hallucinations  . Quetiapine     REACTION: disorientated  . Rosuvastatin     REACTION: Reaction not known    Current Outpatient Prescriptions  Medication Sig Dispense Refill  . aspirin 81 MG EC tablet Take 81 mg by mouth daily.        . folic acid (FOLVITE) 800 MCG tablet Take 400 mcg by  mouth daily.        . metFORMIN (GLUCOPHAGE) 1000 MG tablet Take 1,000 mg by mouth 2 (two) times daily.        . metoprolol (LOPRESSOR) 100 MG tablet Take 100 mg by mouth 2 (two) times daily.        . Multiple Vitamin (MULTIVITAMIN) tablet Take 1 tablet by mouth daily.        . nitroGLYCERIN (NITROSTAT) 0.4 MG SL tablet Place 0.4 mg under the tongue every 5 (five) minutes as needed.        . nutritional supplement (BOOST HIGH PROTEIN) LIQD Take 237 mLs by mouth daily.        . pravastatin (PRAVACHOL) 20 MG tablet Take 20 mg by mouth daily.        . Thiamine HCl (VITAMIN B-1) 100 MG tablet Take 100 mg by mouth daily.        . vitamin B-12 (CYANOCOBALAMIN) 250 MCG tablet Take 250 mcg by mouth daily.        Marland Kitchen warfarin (COUMADIN) 5 MG tablet Take by mouth as directed.          ROS Negative  other than HPI.   PE General Appearance: well developed, well nourished in no acute distress, chronically ill, skin color is good. HEENT: symmetrical face, PERRLA, good dentition  Neck: no JVD, thyromegaly, or adenopathy, trachea midline Chest: symmetric without deformity Cardiac: PMI non-displaced, irregular rate and rhythm,, normal S1, S2, no gallop or murmur Lung: clear to ausculation and percussion Vascular: all pulses full without bruits  Abdominal: nondistended, nontender, good bowel sounds, no HSM, no bruits Extremities: no cyanosis, clubbing, 2+ stable edema of the right lower extremity., no sign of DVT, no varicosities  Skin: normal color, no rashes Neuro: alert and oriented x 3, non-focal Pysch: normal affect Filed Vitals:   08/26/10 1044  BP: 125/59  Pulse: 77  Resp: 18  Height: 5\' 11"  (1.803 m)  Weight: 224 lb (101.606 kg)    EKG  Labs and Studies Reviewed.   Lab Results  Component Value Date   WBC 8.7 12/03/2009   HGB 11.8* 12/03/2009   HCT 36.5* 12/03/2009   MCV 92.4 12/03/2009   PLT 245 12/03/2009      Chemistry      Component Value Date/Time   NA 141 12/03/2009 1038   K  4.2 12/03/2009 1038   CL 108 12/03/2009 1038   CO2 28 12/03/2009 1038   BUN 21 12/03/2009 1038   CREATININE 1.23 12/03/2009 1038      Component Value Date/Time   CALCIUM 9.5 12/03/2009 1038   ALKPHOS 84 12/03/2009 1038   AST 23 12/03/2009 1038   ALT 18 12/03/2009 1038   BILITOT 0.7 12/03/2009 1038       Lab Results  Component Value Date   CHOL  Value: 121        ATP III CLASSIFICATION:  <200     mg/dL   Desirable  161-096  mg/dL   Borderline High  >=045    mg/dL   High        4/0/9811   Lab Results  Component Value Date   HDL 25* 09/01/2009   Lab Results  Component Value Date   LDLCALC  Value: 67        Total Cholesterol/HDL:CHD Risk Coronary Heart Disease Risk Table                     Men   Women  1/2 Average Risk   3.4   3.3  Average Risk       5.0   4.4  2 X Average Risk   9.6   7.1  3 X Average Risk  23.4   11.0        Use the calculated Patient Ratio above and the CHD Risk Table to determine the patient's CHD Risk.        ATP III CLASSIFICATION (LDL):  <100     mg/dL   Optimal  914-782  mg/dL   Near or Above                    Optimal  130-159  mg/dL   Borderline  956-213  mg/dL   High  >086     mg/dL   Very High 06/01/8467   Lab Results  Component Value Date   TRIG 147 09/01/2009   Lab Results  Component Value Date   CHOLHDL 4.8 09/01/2009   Lab Results  Component Value Date   HGBA1C  Value: 6.2 (NOTE)  According to the ADA Clinical Practice Recommendations for 2011, when HbA1c is used as a screening test:   >=6.5%   Diagnostic of Diabetes Mellitus           (if abnormal result  is confirmed)  5.7-6.4%   Increased risk of developing Diabetes Mellitus  References:Diagnosis and Classification of Diabetes Mellitus,Diabetes Care,2011,34(Suppl 1):S62-S69 and Standards of Medical Care in         Diabetes - 2011,Diabetes Care,2011,34  (Suppl 1):S11-S61.* 07/31/2009   Lab Results  Component Value Date   ALT 18 12/03/2009   AST  23 12/03/2009   ALKPHOS 84 12/03/2009   BILITOT 0.7 12/03/2009   No results found for this basename: TSH

## 2010-08-26 NOTE — Assessment & Plan Note (Signed)
Stable. No change in treatment. 

## 2010-08-26 NOTE — Assessment & Plan Note (Signed)
Will add back quinapril if his creatinine and potassium is stable.

## 2010-08-28 ENCOUNTER — Telehealth: Payer: Self-pay | Admitting: *Deleted

## 2010-08-28 MED ORDER — QUINAPRIL HCL 20 MG PO TABS: 20.0000 mg | ORAL_TABLET | Freq: Every day | ORAL | Status: DC

## 2010-08-28 NOTE — Telephone Encounter (Signed)
Message copied by Theda Belfast on Fri Aug 28, 2010  2:30 PM ------      Message from: [place] C      Created: Fri Aug 28, 2010  1:38 PM       Looks good. We can restart an ACE inhibitor. Quinapril which he was on before is fine. He is also not anemic. He does not need vitamin supplements at this point other than a good multivitamin with iron.

## 2010-08-28 NOTE — Telephone Encounter (Signed)
Wife is aware of lab results. Pt will restart Quinapril 20mg  daily Mylo Red RN

## 2010-09-23 ENCOUNTER — Ambulatory Visit (INDEPENDENT_AMBULATORY_CARE_PROVIDER_SITE_OTHER): Payer: Medicare Other | Admitting: *Deleted

## 2010-09-23 DIAGNOSIS — I4891 Unspecified atrial fibrillation: Secondary | ICD-10-CM

## 2010-10-01 ENCOUNTER — Other Ambulatory Visit: Payer: Self-pay | Admitting: Cardiology

## 2010-10-21 ENCOUNTER — Ambulatory Visit (INDEPENDENT_AMBULATORY_CARE_PROVIDER_SITE_OTHER): Payer: Medicare Other | Admitting: *Deleted

## 2010-10-21 DIAGNOSIS — I4891 Unspecified atrial fibrillation: Secondary | ICD-10-CM

## 2010-11-05 ENCOUNTER — Other Ambulatory Visit: Payer: Self-pay | Admitting: Cardiology

## 2010-11-06 NOTE — Telephone Encounter (Signed)
rx request for warfarin

## 2010-11-19 ENCOUNTER — Ambulatory Visit (INDEPENDENT_AMBULATORY_CARE_PROVIDER_SITE_OTHER): Payer: Medicare Other | Admitting: *Deleted

## 2010-11-19 DIAGNOSIS — I4891 Unspecified atrial fibrillation: Secondary | ICD-10-CM

## 2010-12-16 ENCOUNTER — Ambulatory Visit (INDEPENDENT_AMBULATORY_CARE_PROVIDER_SITE_OTHER): Payer: Medicare Other | Admitting: *Deleted

## 2010-12-16 DIAGNOSIS — I4891 Unspecified atrial fibrillation: Secondary | ICD-10-CM

## 2010-12-16 DIAGNOSIS — Z7901 Long term (current) use of anticoagulants: Secondary | ICD-10-CM | POA: Insufficient documentation

## 2010-12-27 ENCOUNTER — Other Ambulatory Visit: Payer: Self-pay | Admitting: Cardiology

## 2011-01-13 ENCOUNTER — Ambulatory Visit (INDEPENDENT_AMBULATORY_CARE_PROVIDER_SITE_OTHER): Payer: Medicare Other | Admitting: *Deleted

## 2011-01-13 DIAGNOSIS — Z7901 Long term (current) use of anticoagulants: Secondary | ICD-10-CM

## 2011-01-13 DIAGNOSIS — I4891 Unspecified atrial fibrillation: Secondary | ICD-10-CM

## 2011-01-13 LAB — POCT INR: INR: 2.6

## 2011-02-05 ENCOUNTER — Other Ambulatory Visit: Payer: Self-pay | Admitting: Cardiology

## 2011-02-25 ENCOUNTER — Ambulatory Visit (INDEPENDENT_AMBULATORY_CARE_PROVIDER_SITE_OTHER): Payer: Medicare Other | Admitting: Pharmacist

## 2011-02-25 ENCOUNTER — Ambulatory Visit (INDEPENDENT_AMBULATORY_CARE_PROVIDER_SITE_OTHER): Payer: Medicare Other | Admitting: Cardiology

## 2011-02-25 ENCOUNTER — Encounter: Payer: Self-pay | Admitting: Cardiology

## 2011-02-25 VITALS — BP 124/68 | HR 64 | Ht 71.0 in | Wt 230.0 lb

## 2011-02-25 DIAGNOSIS — I4891 Unspecified atrial fibrillation: Secondary | ICD-10-CM

## 2011-02-25 DIAGNOSIS — E785 Hyperlipidemia, unspecified: Secondary | ICD-10-CM

## 2011-02-25 DIAGNOSIS — I10 Essential (primary) hypertension: Secondary | ICD-10-CM

## 2011-02-25 DIAGNOSIS — Z7901 Long term (current) use of anticoagulants: Secondary | ICD-10-CM

## 2011-02-25 DIAGNOSIS — R609 Edema, unspecified: Secondary | ICD-10-CM

## 2011-02-25 MED ORDER — METOPROLOL TARTRATE 100 MG PO TABS: 100.0000 mg | ORAL_TABLET | Freq: Two times a day (BID) | ORAL | Status: DC

## 2011-02-25 NOTE — Patient Instructions (Signed)
Your physician wants you to follow-up in: 6 months with dr. Daleen Squibb You will receive a reminder letter in the mail two months in advance. If you don't receive a letter, please call our office to schedule the follow-up appointment.   Your physician recommends that you continue on your current medications as directed. Please refer to the Current Medication list given to you today.

## 2011-02-25 NOTE — Assessment & Plan Note (Signed)
Stable. No change in medical therapy. See us back in the office in 6 months 

## 2011-02-25 NOTE — Progress Notes (Signed)
HPI Jordan Clayton returns today for evaluation and management of his chronic A. Fib and anticoagulation. He has a history of a non-STEMI the setting of septic shock. A  He has done remarkably well. His blood work and anticoagulation are followed by Dr. Jeannetta Nap his primary care physician.  He denies any bleeding or melena. He denies chest pain or palpitations. His weight has been stable. He denies any falls. He is very compliant with his medications.  Past Medical History  Diagnosis Date  . Delirium   . Hypernatremia   . Septic shock   . Acute MI     subendocardial  . CAD (coronary artery disease)   . Chronic atrial fibrillation   . Drug therapy     coumadin  . HLD (hyperlipidemia)     mixed  . HTN (hypertension)   . DM2 (diabetes mellitus, type 2)     Current Outpatient Prescriptions  Medication Sig Dispense Refill  . aspirin 81 MG EC tablet Take 81 mg by mouth daily.        . folic acid (FOLVITE) 800 MCG tablet Take 400 mcg by mouth daily.        . metFORMIN (GLUCOPHAGE) 1000 MG tablet Take 1,000 mg by mouth 2 (two) times daily.        . metoprolol (LOPRESSOR) 100 MG tablet Take 1 tablet (100 mg total) by mouth 2 (two) times daily.  180 tablet  3  . Multiple Vitamin (MULTIVITAMIN) tablet Take 1 tablet by mouth daily.        . nitroGLYCERIN (NITROSTAT) 0.4 MG SL tablet Place 0.4 mg under the tongue every 5 (five) minutes as needed.        . nutritional supplement (BOOST HIGH PROTEIN) LIQD Take 237 mLs by mouth daily.        . pravastatin (PRAVACHOL) 20 MG tablet Take 20 mg by mouth daily.        . quinapril (ACCUPRIL) 20 MG tablet Take 1 tablet (20 mg total) by mouth at bedtime.  90 tablet  3  . Thiamine HCl (VITAMIN B-1) 100 MG tablet Take 100 mg by mouth daily.        . vitamin B-12 (CYANOCOBALAMIN) 250 MCG tablet Take 250 mcg by mouth daily.        Marland Kitchen warfarin (COUMADIN) 5 MG tablet TAKE AS DIRECTED BY ANTICOAGULATION  CLINIC  30 tablet  3  . warfarin (COUMADIN) 7.5 MG tablet TAKE  AS DIRECTED BY ANTICOAGULATION CLINIC  90 tablet  1    Allergies  Allergen Reactions  . Atorvastatin   . Oxycodone-Acetaminophen     REACTION: Hallucinations  . Quetiapine     REACTION: disorientated  . Rosuvastatin     REACTION: Reaction not known    Family History  Problem Relation Age of Onset  . Coronary artery disease      family hx  . Hypertension      family hx    History   Social History  . Marital Status: Married    Spouse Name: N/A    Number of Children: N/A  . Years of Education: N/A   Occupational History  . Not on file.   Social History Main Topics  . Smoking status: Never Smoker   . Smokeless tobacco: Not on file  . Alcohol Use: No  . Drug Use: Not on file  . Sexually Active: Not on file   Other Topics Concern  . Not on file   Social History Narrative  Married; retired; disabled, does not get regular exercise.     ROS ALL NEGATIVE EXCEPT THOSE NOTED IN HPI  PE  General Appearance: well developed, well nourished in no acute distress, obese HEENT: symmetrical face, PERRLA  Neck: no JVD, thyromegaly, or adenopathy, trachea midline Chest: symmetric without deformity Cardiac: PMI non-displaced,irregular rate and rhythm normal S1, S2, no gallop or murmur Lung: clear to ausculation and percussion Vascular: all pulses full without bruits  Abdominal: nondistended, nontender, good bowel sounds, no HSM, no bruits Extremities: no cyanosis, clubbing, chronic right lower  Extremity edema, no sign of DVT, no varicosities  Skin: normal color, no rashes Neuro: alert and oriented x 3, non-focal Pysch: normal affect  EKG  BMET    Component Value Date/Time   NA 140 08/26/2010 1150   K 4.6 08/26/2010 1150   CL 107 08/26/2010 1150   CO2 26 08/26/2010 1150   GLUCOSE 111* 08/26/2010 1150   BUN 33* 08/26/2010 1150   CREATININE 1.0 08/26/2010 1150   CALCIUM 9.5 08/26/2010 1150   GFRNONAA 58* 12/03/2009 1038   GFRAA  Value: >60        The eGFR has been calculated  using the MDRD equation. This calculation has not been validated in all clinical situations. eGFR's persistently <60 mL/min signify possible Chronic Kidney Disease. 12/03/2009 1038    Lipid Panel     Component Value Date/Time   CHOL  Value: 121        ATP III CLASSIFICATION:  <200     mg/dL   Desirable  161-096  mg/dL   Borderline High  >=045    mg/dL   High        4/0/9811 0341   TRIG 147 09/01/2009 0341   HDL 25* 09/01/2009 0341   CHOLHDL 4.8 09/01/2009 0341   VLDL 29 09/01/2009 0341   LDLCALC  Value: 67        Total Cholesterol/HDL:CHD Risk Coronary Heart Disease Risk Table                     Men   Women  1/2 Average Risk   3.4   3.3  Average Risk       5.0   4.4  2 X Average Risk   9.6   7.1  3 X Average Risk  23.4   11.0        Use the calculated Patient Ratio above and the CHD Risk Table to determine the patient's CHD Risk.        ATP III CLASSIFICATION (LDL):  <100     mg/dL   Optimal  914-782  mg/dL   Near or Above                    Optimal  130-159  mg/dL   Borderline  956-213  mg/dL   High  >086     mg/dL   Very High 06/01/8467 6295    CBC    Component Value Date/Time   WBC 9.1 08/26/2010 1150   RBC 4.20* 08/26/2010 1150   HGB 13.4 08/26/2010 1150   HCT 40.7 08/26/2010 1150   PLT 214.0 08/26/2010 1150   MCV 96.9 08/26/2010 1150   MCH 29.9 12/03/2009 1038   MCHC 32.9 08/26/2010 1150   RDW 14.2 08/26/2010 1150   LYMPHSABS 1.9 08/26/2010 1150   MONOABS 1.1* 08/26/2010 1150   EOSABS 0.3 08/26/2010 1150   BASOSABS 0.0 08/26/2010 1150

## 2011-03-24 ENCOUNTER — Ambulatory Visit (INDEPENDENT_AMBULATORY_CARE_PROVIDER_SITE_OTHER): Payer: Medicare Other | Admitting: Pharmacist

## 2011-03-24 DIAGNOSIS — Z7901 Long term (current) use of anticoagulants: Secondary | ICD-10-CM

## 2011-03-24 DIAGNOSIS — I4891 Unspecified atrial fibrillation: Secondary | ICD-10-CM

## 2011-04-21 ENCOUNTER — Ambulatory Visit (INDEPENDENT_AMBULATORY_CARE_PROVIDER_SITE_OTHER): Payer: Medicare Other | Admitting: *Deleted

## 2011-04-21 DIAGNOSIS — Z7901 Long term (current) use of anticoagulants: Secondary | ICD-10-CM

## 2011-04-21 DIAGNOSIS — I4891 Unspecified atrial fibrillation: Secondary | ICD-10-CM

## 2011-04-21 LAB — POCT INR: INR: 2.3

## 2011-06-02 ENCOUNTER — Ambulatory Visit (INDEPENDENT_AMBULATORY_CARE_PROVIDER_SITE_OTHER): Payer: Medicare Other | Admitting: *Deleted

## 2011-06-02 DIAGNOSIS — I4891 Unspecified atrial fibrillation: Secondary | ICD-10-CM

## 2011-06-02 DIAGNOSIS — Z7901 Long term (current) use of anticoagulants: Secondary | ICD-10-CM

## 2011-07-14 ENCOUNTER — Ambulatory Visit (INDEPENDENT_AMBULATORY_CARE_PROVIDER_SITE_OTHER): Payer: Medicare Other | Admitting: *Deleted

## 2011-07-14 DIAGNOSIS — Z7901 Long term (current) use of anticoagulants: Secondary | ICD-10-CM

## 2011-07-14 DIAGNOSIS — I4891 Unspecified atrial fibrillation: Secondary | ICD-10-CM

## 2011-08-24 ENCOUNTER — Encounter: Payer: Self-pay | Admitting: *Deleted

## 2011-08-26 ENCOUNTER — Ambulatory Visit (INDEPENDENT_AMBULATORY_CARE_PROVIDER_SITE_OTHER): Payer: Medicare Other

## 2011-08-26 DIAGNOSIS — Z7901 Long term (current) use of anticoagulants: Secondary | ICD-10-CM

## 2011-08-26 DIAGNOSIS — I4891 Unspecified atrial fibrillation: Secondary | ICD-10-CM

## 2011-08-26 LAB — POCT INR: INR: 3.2

## 2011-08-28 ENCOUNTER — Other Ambulatory Visit: Payer: Self-pay | Admitting: Cardiology

## 2011-08-31 ENCOUNTER — Ambulatory Visit (INDEPENDENT_AMBULATORY_CARE_PROVIDER_SITE_OTHER): Payer: Medicare Other | Admitting: Cardiology

## 2011-08-31 ENCOUNTER — Encounter: Payer: Self-pay | Admitting: Cardiology

## 2011-08-31 VITALS — BP 150/66 | HR 53 | Ht 71.0 in | Wt 235.0 lb

## 2011-08-31 DIAGNOSIS — I252 Old myocardial infarction: Secondary | ICD-10-CM

## 2011-08-31 DIAGNOSIS — R609 Edema, unspecified: Secondary | ICD-10-CM

## 2011-08-31 DIAGNOSIS — E785 Hyperlipidemia, unspecified: Secondary | ICD-10-CM

## 2011-08-31 DIAGNOSIS — E119 Type 2 diabetes mellitus without complications: Secondary | ICD-10-CM

## 2011-08-31 DIAGNOSIS — I4891 Unspecified atrial fibrillation: Secondary | ICD-10-CM

## 2011-08-31 DIAGNOSIS — I2510 Atherosclerotic heart disease of native coronary artery without angina pectoris: Secondary | ICD-10-CM | POA: Insufficient documentation

## 2011-08-31 DIAGNOSIS — Z7901 Long term (current) use of anticoagulants: Secondary | ICD-10-CM

## 2011-08-31 DIAGNOSIS — I10 Essential (primary) hypertension: Secondary | ICD-10-CM

## 2011-08-31 MED ORDER — FUROSEMIDE 40 MG PO TABS: 40.0000 mg | ORAL_TABLET | Freq: Every day | ORAL | Status: DC | PRN

## 2011-08-31 MED ORDER — NITROGLYCERIN 0.4 MG SL SUBL: 0.4000 mg | SUBLINGUAL_TABLET | SUBLINGUAL | Status: DC | PRN

## 2011-08-31 NOTE — Addendum Note (Signed)
Addended by: Lisabeth Devoid F on: 08/31/2011 11:24 AM   Modules accepted: Orders

## 2011-08-31 NOTE — Progress Notes (Signed)
HPI Mr. Labrada returns today for evaluation and management of his history of subendocardial MI, in the setting of sepsis, coronary artery disease, diabetes, atrial fib, anticoagulation, hypertension and hyperlipidemia.  He is doing well except has gained 5 pounds. He denies any angina, palpitations, presyncope or syncope, or any sign of blood loss. He is very compliant with his meds.  His blood pressures been running about 120-140 at home on his automated cuff.  Past Medical History  Diagnosis Date  . Delirium   . Hypernatremia   . Septic shock   . Acute MI     subendocardial  . CAD (coronary artery disease)   . Chronic atrial fibrillation   . Drug therapy     coumadin  . Mixed hyperlipidemia   . HTN (hypertension)   . DM2 (diabetes mellitus, type 2)     Current Outpatient Prescriptions  Medication Sig Dispense Refill  . aspirin 81 MG EC tablet Take 81 mg by mouth daily.        . folic acid (FOLVITE) 800 MCG tablet Take 400 mcg by mouth daily.        . metFORMIN (GLUCOPHAGE) 1000 MG tablet Take 1,000 mg by mouth 2 (two) times daily.        . metoprolol (LOPRESSOR) 100 MG tablet Take 1 tablet (100 mg total) by mouth 2 (two) times daily.  180 tablet  3  . Multiple Vitamin (MULTIVITAMIN) tablet Take 1 tablet by mouth daily.        . nitroGLYCERIN (NITROSTAT) 0.4 MG SL tablet Place 0.4 mg under the tongue every 5 (five) minutes as needed.        . nutritional supplement (BOOST HIGH PROTEIN) LIQD Take 237 mLs by mouth daily.        . pravastatin (PRAVACHOL) 20 MG tablet Take 20 mg by mouth daily.        . quinapril (ACCUPRIL) 20 MG tablet Take 20 mg by mouth at bedtime.      . Thiamine HCl (VITAMIN B-1) 100 MG tablet Take 100 mg by mouth daily.        . vitamin B-12 (CYANOCOBALAMIN) 250 MCG tablet Take 250 mcg by mouth daily.        Marland Kitchen warfarin (COUMADIN) 5 MG tablet TAKE AS DIRECTED BY ANTICOAGULATION  CLINIC  30 tablet  3  . warfarin (COUMADIN) 7.5 MG tablet TAKE AS DIRECTED BY  ANTICOAGULATION CLINIC  90 tablet  1  . DISCONTD: quinapril (ACCUPRIL) 20 MG tablet Take 1 tablet (20 mg total) by mouth at bedtime.  90 tablet  3    Allergies  Allergen Reactions  . Atorvastatin   . Oxycodone-Acetaminophen     REACTION: Hallucinations  . Quetiapine     REACTION: disorientated  . Rosuvastatin     REACTION: Reaction not known    Family History  Problem Relation Age of Onset  . Coronary artery disease      family hx  . Hypertension      family hx    History   Social History  . Marital Status: Married    Spouse Name: N/A    Number of Children: N/A  . Years of Education: N/A   Occupational History  . Not on file.   Social History Main Topics  . Smoking status: Never Smoker   . Smokeless tobacco: Not on file  . Alcohol Use: No  . Drug Use: Not on file  . Sexually Active: Not on file   Other Topics  Concern  . Not on file   Social History Narrative   Married; retired; disabled, does not get regular exercise.     ROS ALL NEGATIVE EXCEPT THOSE NOTED IN HPI  PE  General Appearance: well developed, well nourished in no acute distress, overweight HEENT: symmetrical face, PERRLA, good dentition  Neck: no JVD, thyromegaly, or adenopathy, trachea midline Chest: symmetric without deformity Cardiac: PMI non-displaced, irregular rate and rhythm, normal S1, S2, no gallop or murmur Lung: clear to ausculation and percussion Vascular: all pulses full without bruits  Abdominal: nondistended, nontender, good bowel sounds, no HSM, no bruits Extremities: no cyanosis, right lower extremity swollen which is baseline. Pulses difficult to feel., no sign of DVT, no varicosities  Skin: normal color, no rashes Neuro: alert and oriented x 3, non-focal Pysch: normal affect  EKG Chronic A. fib, rate in the 50s, no acute changes. BMET    Component Value Date/Time   NA 140 08/26/2010 1150   K 4.6 08/26/2010 1150   CL 107 08/26/2010 1150   CO2 26 08/26/2010 1150   GLUCOSE  111* 08/26/2010 1150   BUN 33* 08/26/2010 1150   CREATININE 1.0 08/26/2010 1150   CALCIUM 9.5 08/26/2010 1150   GFRNONAA 58* 12/03/2009 1038   GFRAA  Value: >60        The eGFR has been calculated using the MDRD equation. This calculation has not been validated in all clinical situations. eGFR's persistently <60 mL/min signify possible Chronic Kidney Disease. 12/03/2009 1038    Lipid Panel     Component Value Date/Time   CHOL  Value: 121        ATP III CLASSIFICATION:  <200     mg/dL   Desirable  409-811  mg/dL   Borderline High  >=914    mg/dL   High        08/02/2954 0341   TRIG 147 09/01/2009 0341   HDL 25* 09/01/2009 0341   CHOLHDL 4.8 09/01/2009 0341   VLDL 29 09/01/2009 0341   LDLCALC  Value: 67        Total Cholesterol/HDL:CHD Risk Coronary Heart Disease Risk Table                     Men   Women  1/2 Average Risk   3.4   3.3  Average Risk       5.0   4.4  2 X Average Risk   9.6   7.1  3 X Average Risk  23.4   11.0        Use the calculated Patient Ratio above and the CHD Risk Table to determine the patient's CHD Risk.        ATP III CLASSIFICATION (LDL):  <100     mg/dL   Optimal  213-086  mg/dL   Near or Above                    Optimal  130-159  mg/dL   Borderline  578-469  mg/dL   High  >629     mg/dL   Very High 05/27/8411 2440    CBC    Component Value Date/Time   WBC 9.1 08/26/2010 1150   RBC 4.20* 08/26/2010 1150   HGB 13.4 08/26/2010 1150   HCT 40.7 08/26/2010 1150   PLT 214.0 08/26/2010 1150   MCV 96.9 08/26/2010 1150   MCH 29.9 12/03/2009 1038   MCHC 32.9 08/26/2010 1150   RDW 14.2 08/26/2010 1150  LYMPHSABS 1.9 08/26/2010 1150   MONOABS 1.1* 08/26/2010 1150   EOSABS 0.3 08/26/2010 1150   BASOSABS 0.0 08/26/2010 1150

## 2011-08-31 NOTE — Assessment & Plan Note (Signed)
Stable. Continue secondary preventative therapy. 

## 2011-08-31 NOTE — Patient Instructions (Addendum)
Your physician recommends that you continue on your current medications as directed. Please refer to the Current Medication list given to you today.  Your physician recommends that you schedule a follow-up appointment in: 1year with Dr. Wall.  

## 2011-08-31 NOTE — Assessment & Plan Note (Signed)
With his atrial fib, his pressure sometimes hard to read. I checked with a large cuff prior to leaving and is running about 135-140 systolic. His automated cuff at time is reading about 120-140. I suspect is under good control. I've made no changes in his medications. I've encouraged him to drop the weight is again. He seems to be very careful with  salt.

## 2011-08-31 NOTE — Assessment & Plan Note (Signed)
Asymptomatic and rate well controlled. Continue anticoagulation.

## 2011-09-09 ENCOUNTER — Other Ambulatory Visit: Payer: Self-pay | Admitting: Cardiology

## 2011-09-23 ENCOUNTER — Ambulatory Visit (INDEPENDENT_AMBULATORY_CARE_PROVIDER_SITE_OTHER): Payer: Medicare Other | Admitting: *Deleted

## 2011-09-23 DIAGNOSIS — Z7901 Long term (current) use of anticoagulants: Secondary | ICD-10-CM

## 2011-09-23 DIAGNOSIS — I4891 Unspecified atrial fibrillation: Secondary | ICD-10-CM

## 2011-09-23 LAB — POCT INR: INR: 3

## 2011-10-21 ENCOUNTER — Ambulatory Visit (INDEPENDENT_AMBULATORY_CARE_PROVIDER_SITE_OTHER): Payer: Medicare Other | Admitting: *Deleted

## 2011-10-21 DIAGNOSIS — I4891 Unspecified atrial fibrillation: Secondary | ICD-10-CM

## 2011-10-21 DIAGNOSIS — Z7901 Long term (current) use of anticoagulants: Secondary | ICD-10-CM

## 2011-11-25 ENCOUNTER — Ambulatory Visit (INDEPENDENT_AMBULATORY_CARE_PROVIDER_SITE_OTHER): Payer: Medicare Other | Admitting: *Deleted

## 2011-11-25 DIAGNOSIS — I4891 Unspecified atrial fibrillation: Secondary | ICD-10-CM

## 2011-11-25 DIAGNOSIS — Z7901 Long term (current) use of anticoagulants: Secondary | ICD-10-CM

## 2011-11-25 LAB — PROTIME-INR
INR: 5.8 ratio (ref 0.8–1.0)
Prothrombin Time: 59.6 s (ref 10.2–12.4)

## 2011-12-02 ENCOUNTER — Ambulatory Visit (INDEPENDENT_AMBULATORY_CARE_PROVIDER_SITE_OTHER): Payer: Medicare Other

## 2011-12-02 DIAGNOSIS — Z7901 Long term (current) use of anticoagulants: Secondary | ICD-10-CM

## 2011-12-02 DIAGNOSIS — I4891 Unspecified atrial fibrillation: Secondary | ICD-10-CM

## 2011-12-02 LAB — POCT INR: INR: 2.2

## 2011-12-30 ENCOUNTER — Ambulatory Visit (INDEPENDENT_AMBULATORY_CARE_PROVIDER_SITE_OTHER): Payer: Medicare Other

## 2011-12-30 DIAGNOSIS — Z7901 Long term (current) use of anticoagulants: Secondary | ICD-10-CM

## 2011-12-30 DIAGNOSIS — I4891 Unspecified atrial fibrillation: Secondary | ICD-10-CM

## 2011-12-30 LAB — POCT INR: INR: 3.1

## 2012-02-03 ENCOUNTER — Other Ambulatory Visit: Payer: Self-pay | Admitting: *Deleted

## 2012-02-03 ENCOUNTER — Ambulatory Visit (INDEPENDENT_AMBULATORY_CARE_PROVIDER_SITE_OTHER): Payer: Medicare Other | Admitting: *Deleted

## 2012-02-03 DIAGNOSIS — Z7901 Long term (current) use of anticoagulants: Secondary | ICD-10-CM

## 2012-02-03 DIAGNOSIS — I4891 Unspecified atrial fibrillation: Secondary | ICD-10-CM

## 2012-02-03 MED ORDER — METOPROLOL TARTRATE 100 MG PO TABS: 100.0000 mg | ORAL_TABLET | Freq: Two times a day (BID) | ORAL | Status: DC

## 2012-03-16 ENCOUNTER — Ambulatory Visit (INDEPENDENT_AMBULATORY_CARE_PROVIDER_SITE_OTHER): Payer: Medicare Other | Admitting: *Deleted

## 2012-03-16 DIAGNOSIS — Z7901 Long term (current) use of anticoagulants: Secondary | ICD-10-CM

## 2012-03-16 DIAGNOSIS — I4891 Unspecified atrial fibrillation: Secondary | ICD-10-CM

## 2012-03-21 IMAGING — CR DG CHEST 2V
2 series · 2 of 2 positions shown · non-contrast
Comparison: Chest radiograph performed 06/03/2004

CLINICAL DATA: Mid chest pain and abdominal pain.

CHEST - 2 VIEW

[w chest pa]
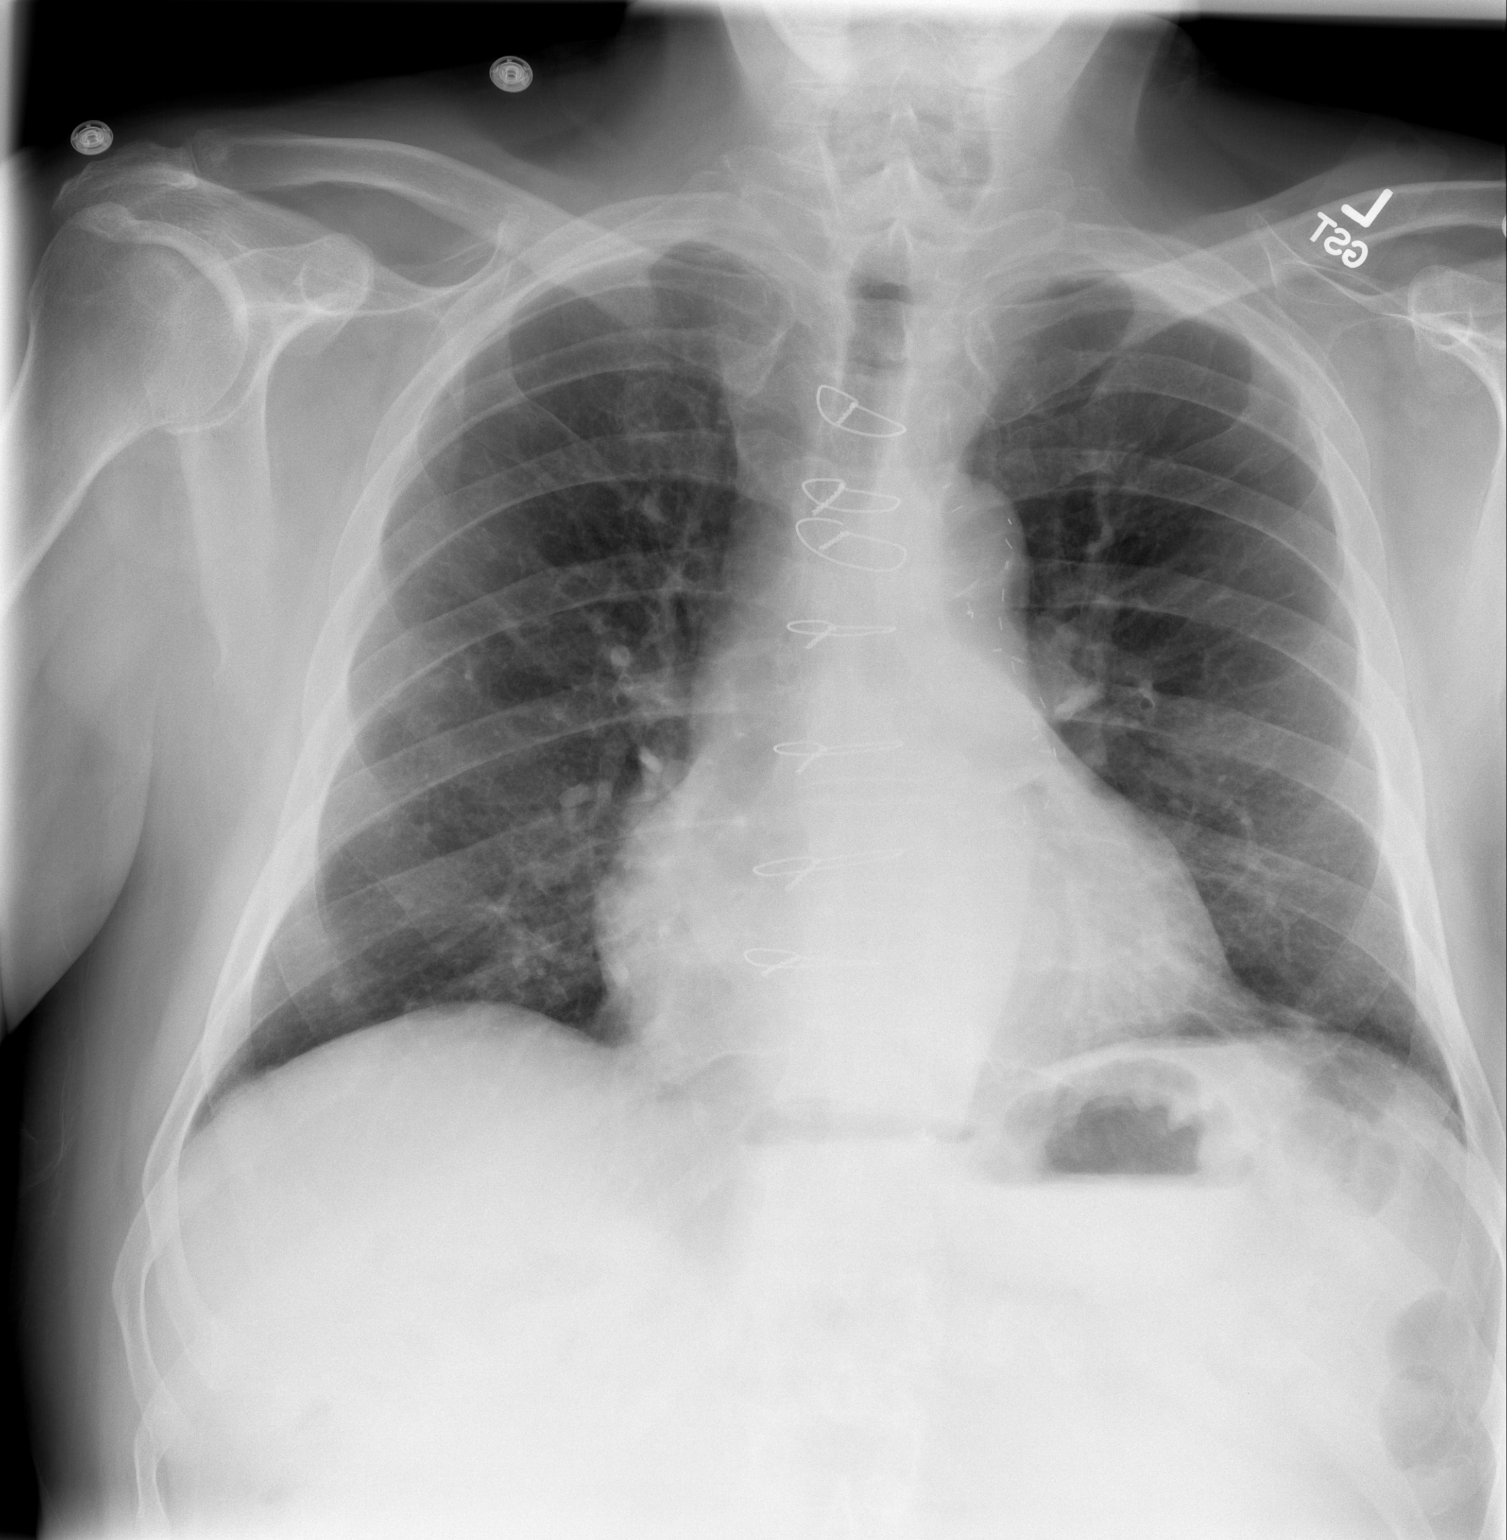

[w chest lat]
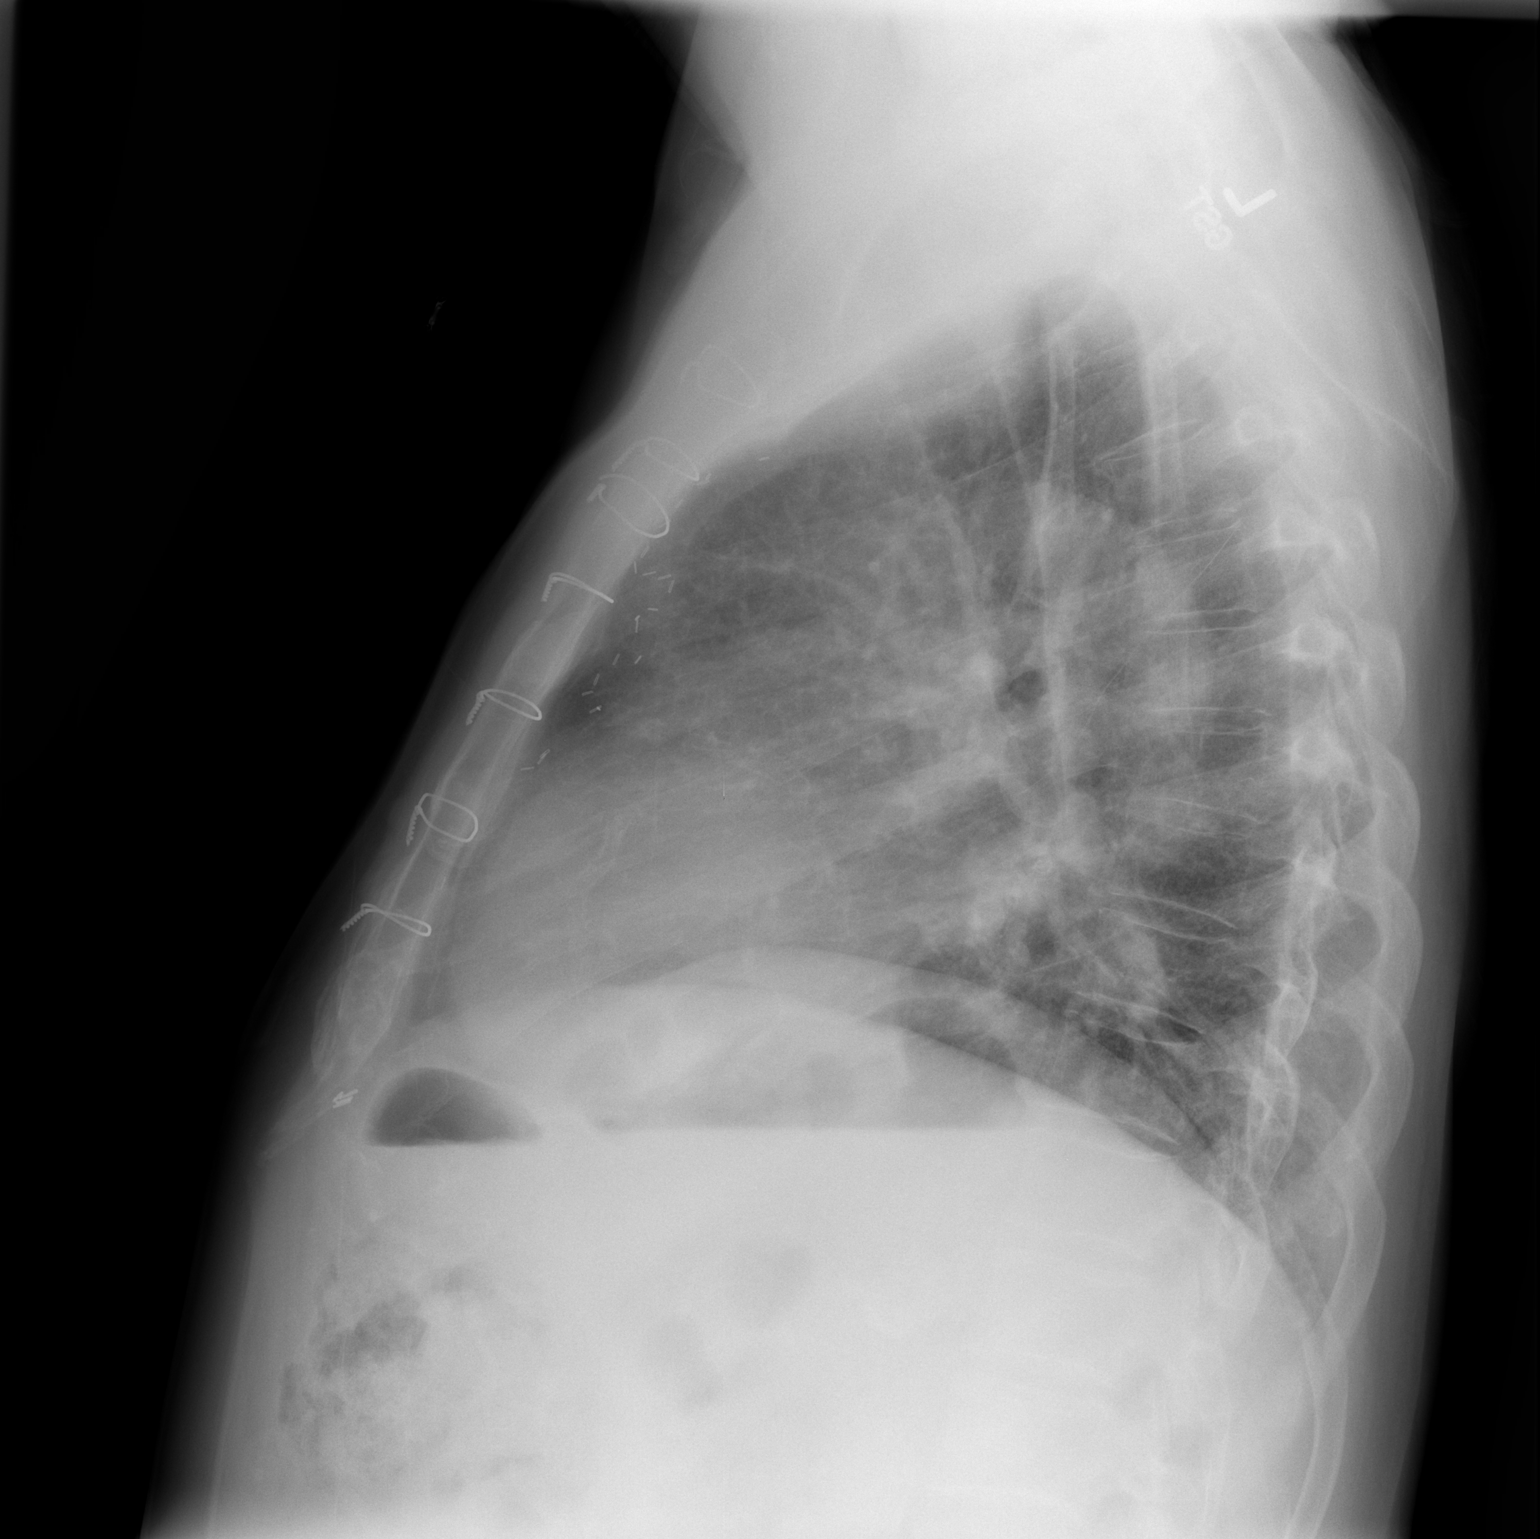

[2 of 2 positions shown; findings below may reference images not displayed]

FINDINGS: The lungs are well-aerated.  Mild left lower lobe
airspace opacification raises concern for pneumonia.  There is no
evidence of pleural effusion or pneumothorax. A small nodular
density at the right lung base is stable from 7555, and may either
reflect a calcified granuloma or the patient's right nipple.

The heart is normal in size; the patient is status post median
sternotomy.  No acute osseous abnormalities are seen.
IMPRESSION: Likely mild left lower lobe pneumonia.

## 2012-03-22 IMAGING — CR DG CHEST 1V PORT
1 series · 1 of 1 positions shown · non-contrast
Comparison: PA and lateral chest 07/31/2009.

CLINICAL DATA: Chest pain and shortness of breath.

PORTABLE CHEST - 1 VIEW

[view not recorded]
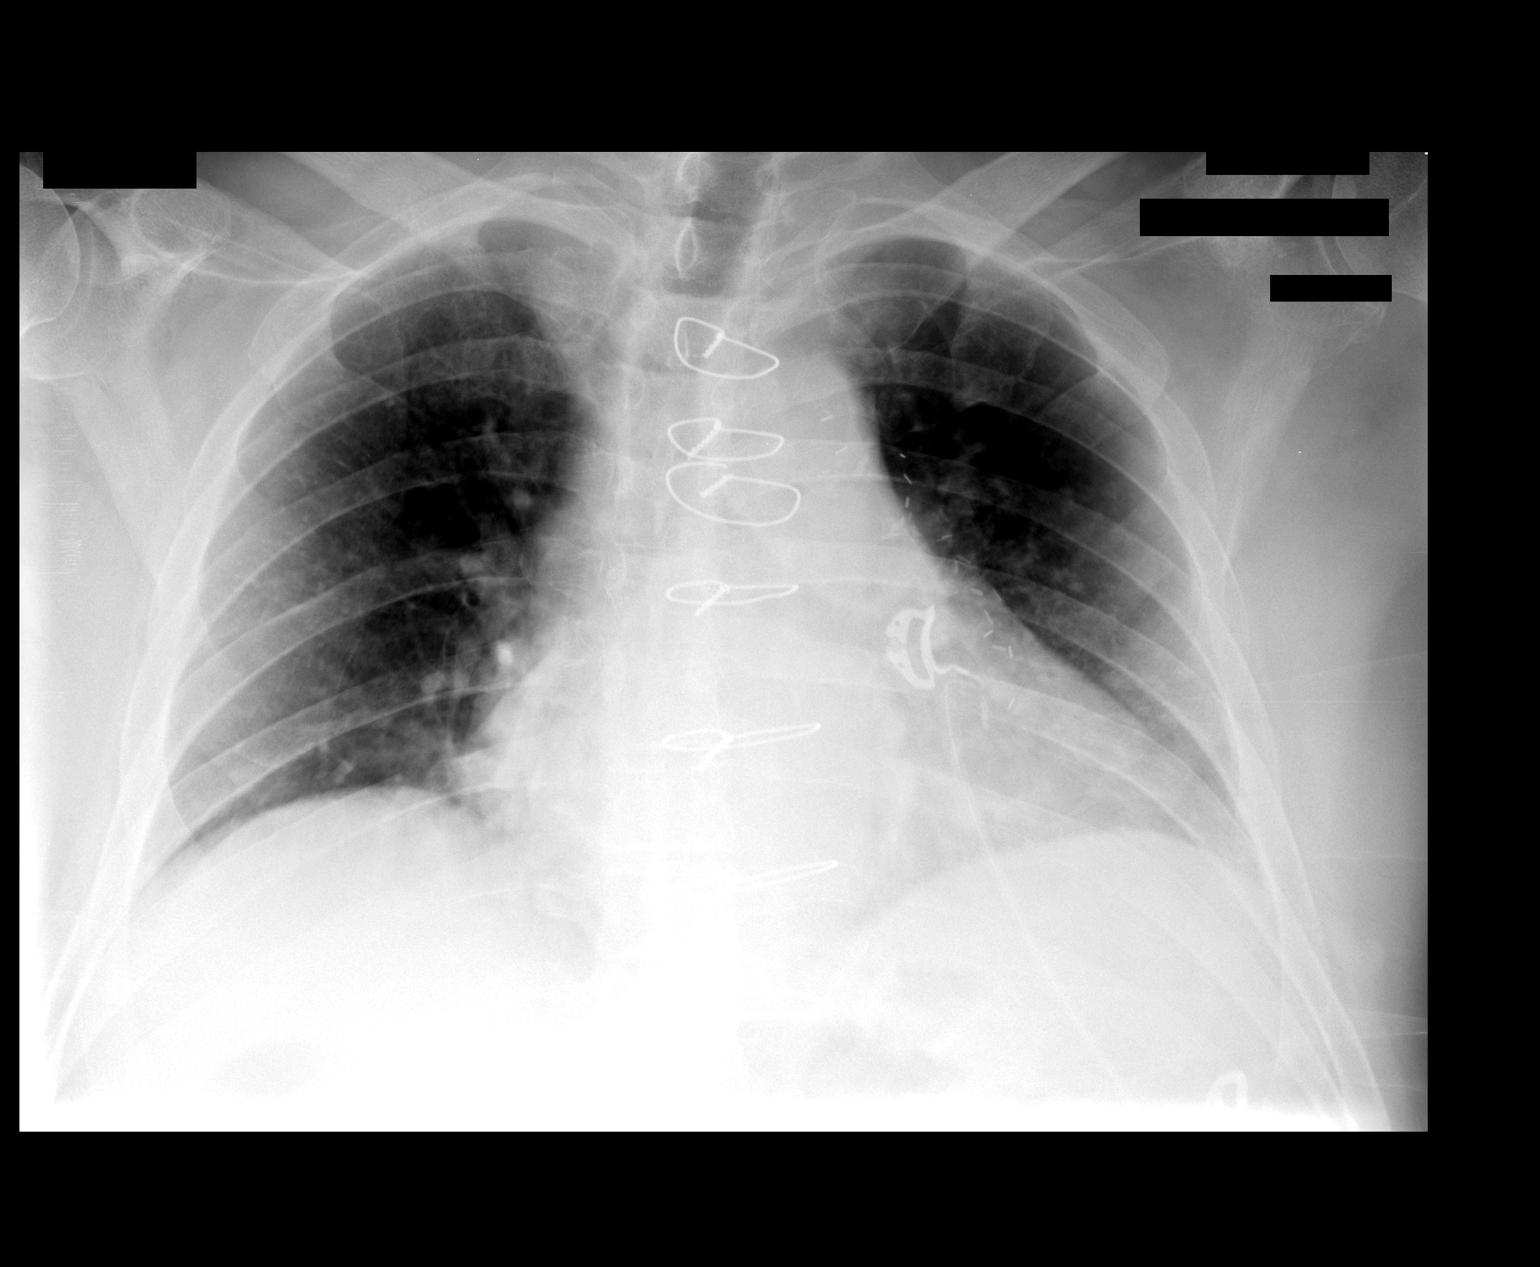

[1 of 1 positions shown; findings below may reference images not displayed]

FINDINGS: Left basilar airspace opacities seen on the prior study
is not appreciated on this examination.  Lung volumes are low.
Cardiomegaly.
IMPRESSION: No acute finding.

## 2012-03-22 IMAGING — CT CT ABD-PELV W/O CM
2 of 4 series · 17 of 46 positions shown, 19 images · non-contrast
Comparison: Abdominal radiograph dated 08/01/2009 and a CT scan
dated 08/08/2003

CLINICAL DATA: Severe abdominal pain.

CT ABDOMEN AND PELVIS WITHOUT CONTRAST
TECHNIQUE: Multidetector CT imaging of the abdomen and pelvis was
performed following the standard protocol without intravenous
contrast.

[Series 2: routine abdomen · axial · 0.91mm/px · z∈[-529,-24]mm · 14 of 111 slices shown, 16 images]
[im 5/111  soft-tissue]
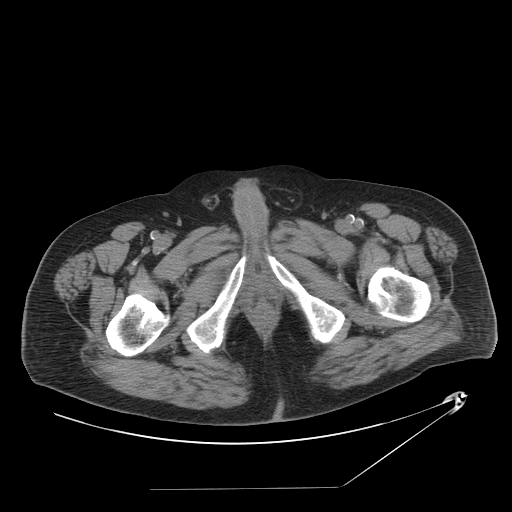
[im 5/111  bone]
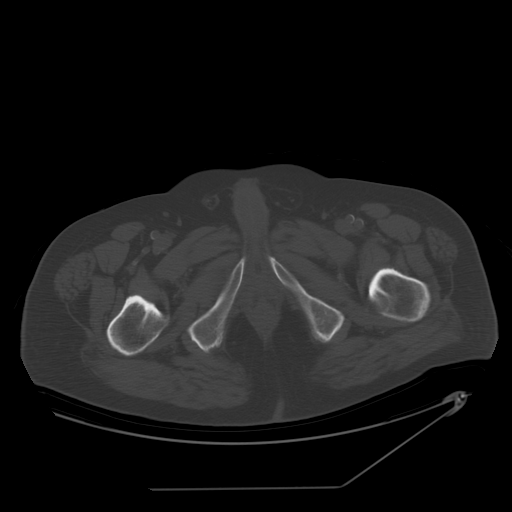
[im 13/111  soft-tissue]
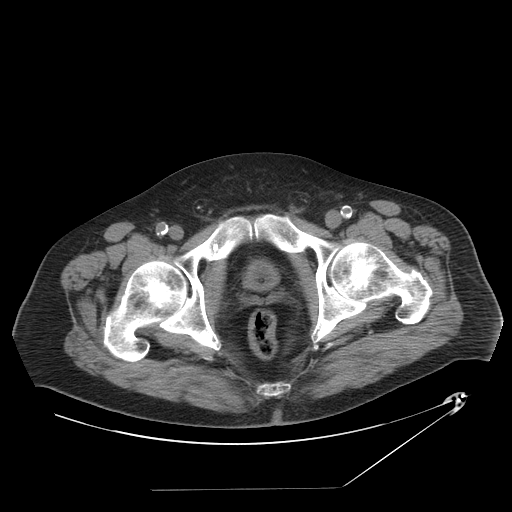
[im 22/111  soft-tissue]
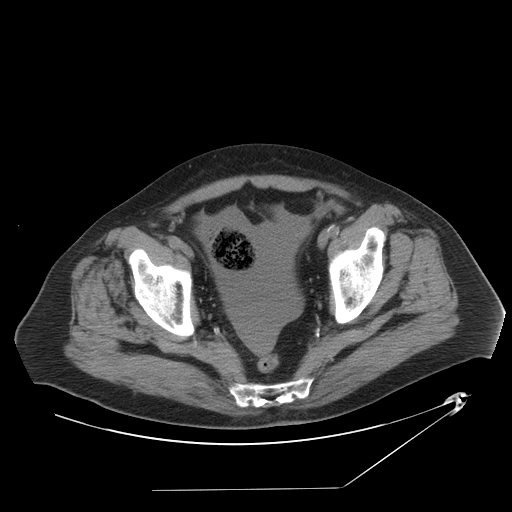
[im 30/111  soft-tissue]
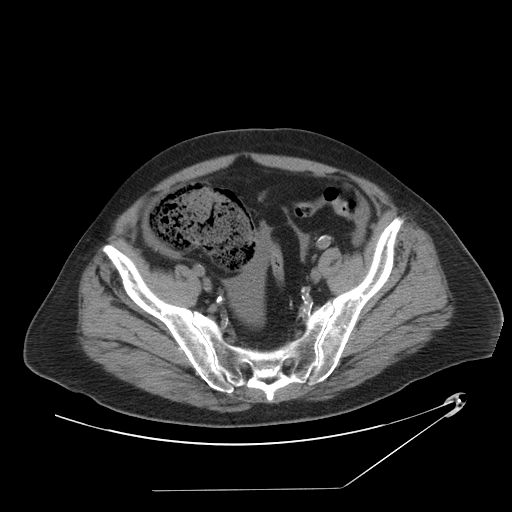
[im 39/111  soft-tissue]
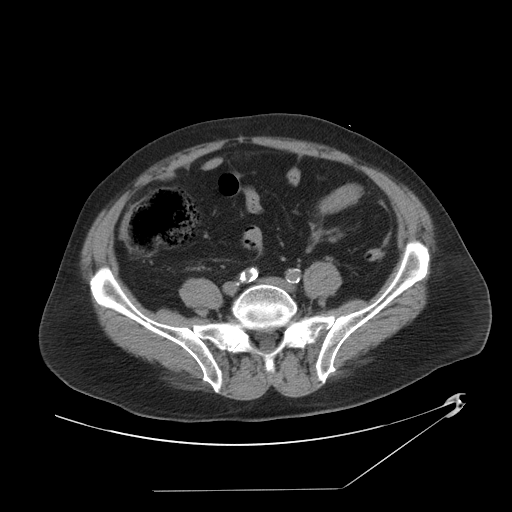
[im 43/111  soft-tissue]
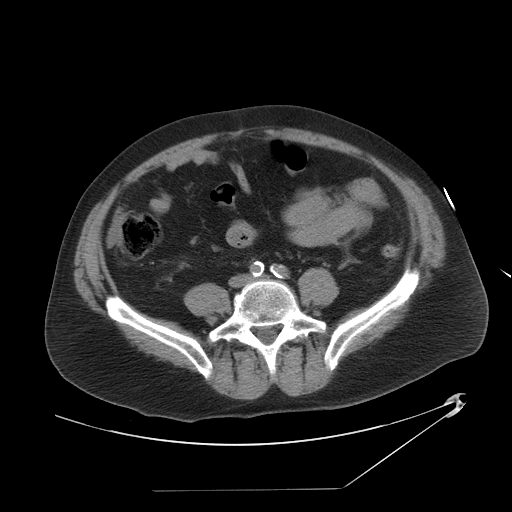
[im 51/111  soft-tissue]
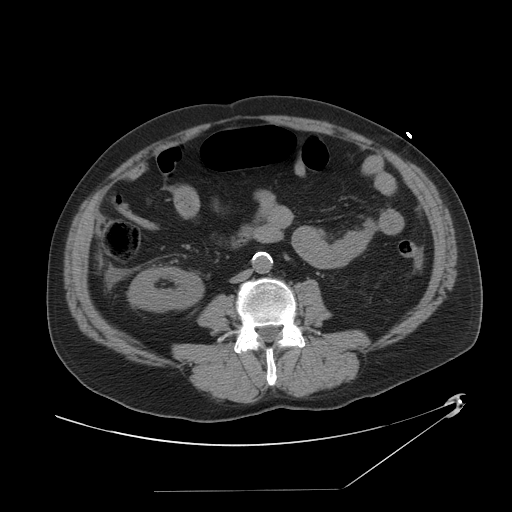
[im 60/111  soft-tissue]
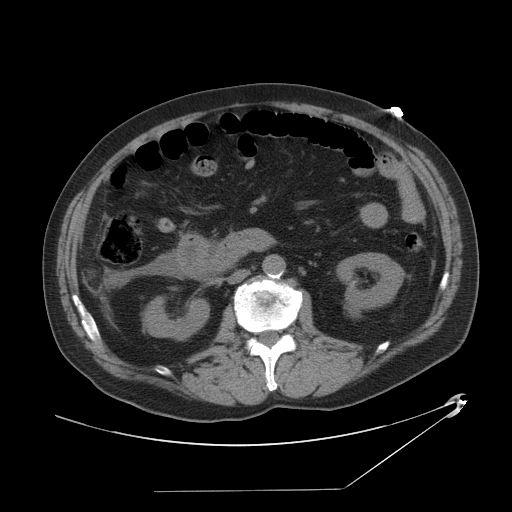
[im 68/111  soft-tissue]
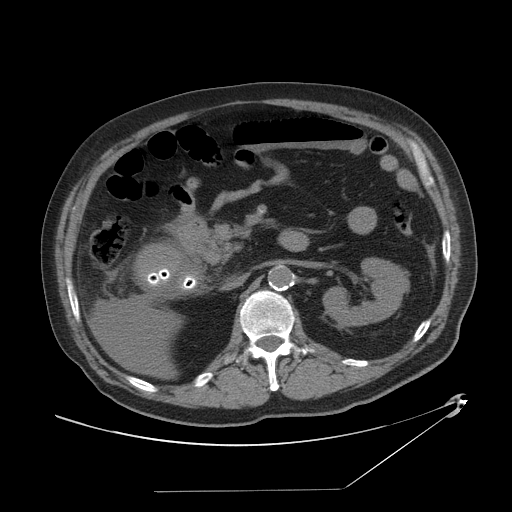
[im 68/111  bone]
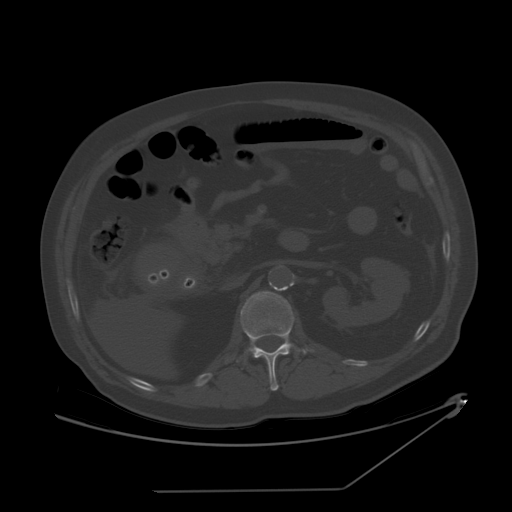
[im 72/111  soft-tissue]
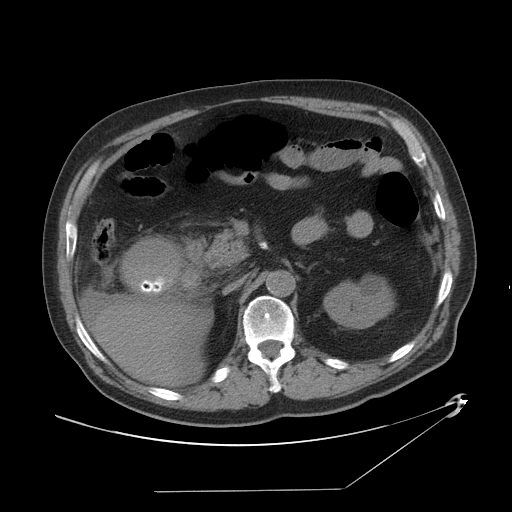
[im 81/111  soft-tissue]
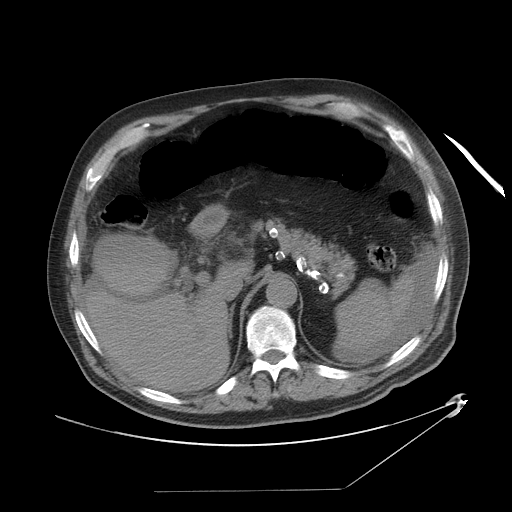
[im 89/111  soft-tissue]
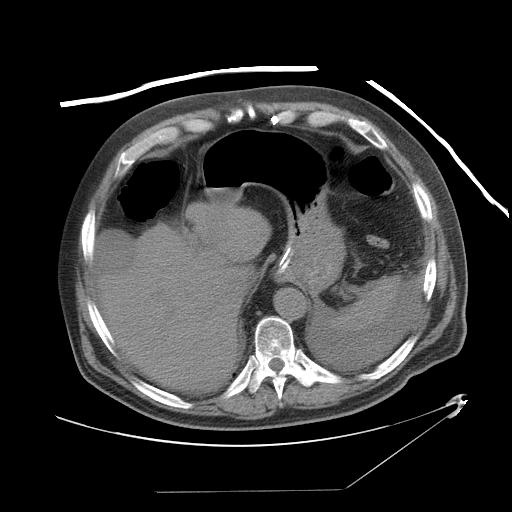
[im 98/111  soft-tissue]
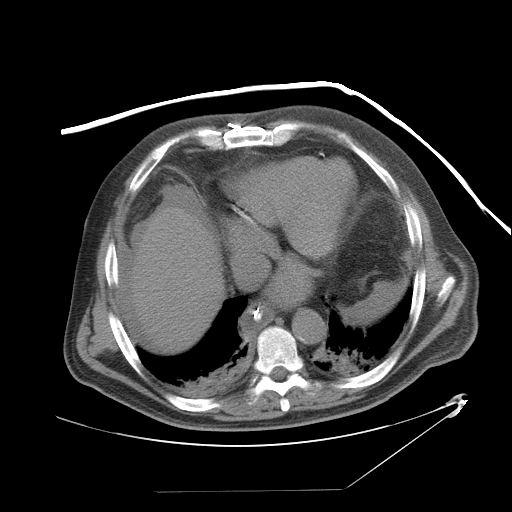
[im 106/111  soft-tissue]
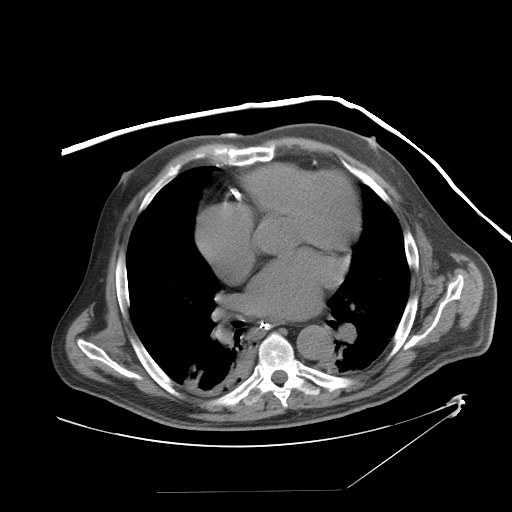

[Series 401: reformatted · coronal · 1.10mm/px · 3 of 114 slices shown]
[im 38/114  soft-tissue]
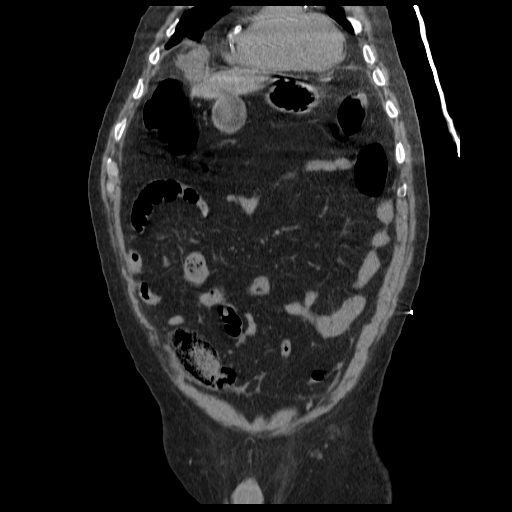
[im 51/114  soft-tissue]
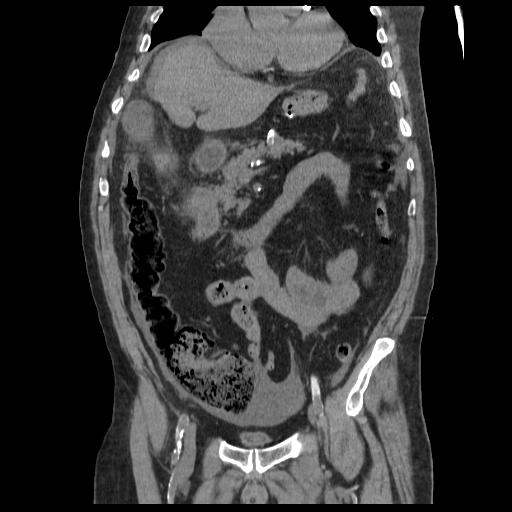
[im 63/114  soft-tissue]
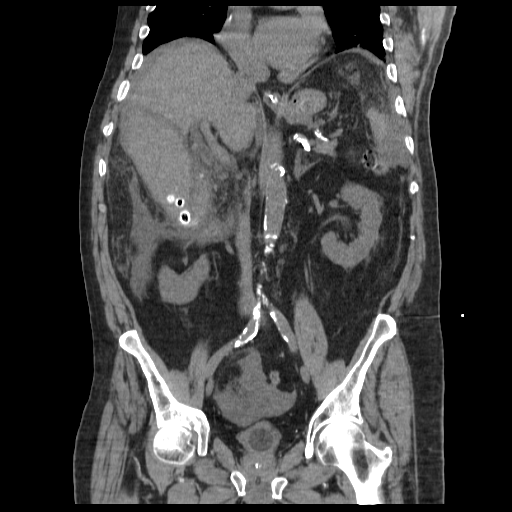

[17 of 46 positions shown; findings below may reference images not displayed]

FINDINGS: There is marked distention of the gallbladder with
multiple calcified gallstones including one that appears to be
impacted in the neck.  There is diffuse edema of the gallbladder
wall.  There is moderate ascites in the abdomen and pelvis.  There
is moderate bibasilar atelectasis with marked cardiomegaly.  There
are no discrete pleural effusions.

The spleen, pancreas, adrenal glands, and kidneys are normal.
There are no dilated loops of bowel.  There is a moderate amount of
stool in the cecum.  Terminal ileum is normal.
IMPRESSION: Acute cholecystitis with marked distention of the gallbladder.
Moderate abdominal ascites.

Moderate bibasilar atelectasis.

## 2012-04-10 ENCOUNTER — Other Ambulatory Visit: Payer: Self-pay | Admitting: Cardiology

## 2012-04-27 ENCOUNTER — Ambulatory Visit (INDEPENDENT_AMBULATORY_CARE_PROVIDER_SITE_OTHER): Payer: Medicare Other

## 2012-04-27 DIAGNOSIS — I4891 Unspecified atrial fibrillation: Secondary | ICD-10-CM

## 2012-04-27 DIAGNOSIS — Z7901 Long term (current) use of anticoagulants: Secondary | ICD-10-CM

## 2012-04-29 ENCOUNTER — Ambulatory Visit (INDEPENDENT_AMBULATORY_CARE_PROVIDER_SITE_OTHER): Payer: Medicare Other | Admitting: Family Medicine

## 2012-04-29 VITALS — BP 104/78 | HR 79 | Temp 98.5°F | Resp 16 | Ht 71.0 in | Wt 235.0 lb

## 2012-04-29 DIAGNOSIS — S81802S Unspecified open wound, left lower leg, sequela: Secondary | ICD-10-CM

## 2012-04-29 DIAGNOSIS — S51802A Unspecified open wound of left forearm, initial encounter: Secondary | ICD-10-CM

## 2012-04-29 DIAGNOSIS — IMO0002 Reserved for concepts with insufficient information to code with codable children: Secondary | ICD-10-CM

## 2012-04-29 DIAGNOSIS — Z23 Encounter for immunization: Secondary | ICD-10-CM

## 2012-04-29 DIAGNOSIS — S51809A Unspecified open wound of unspecified forearm, initial encounter: Secondary | ICD-10-CM

## 2012-04-29 DIAGNOSIS — S0180XA Unspecified open wound of other part of head, initial encounter: Secondary | ICD-10-CM

## 2012-04-29 NOTE — Progress Notes (Signed)
Subjective: Elderly diabetic man who was fishing with his grandson. He went down the back to get the loose, and once he got it freed up he started back up the back, she fell off, he fell down, he got multiple wounds. He came in here to get checked. He had his protime checked for his warfarin a few days ago and the INR was 2.7  They cleaned up and dressed his wounds at home. He has a laceration above his left eyebrow, one just below his knee, 2 on his shin and lateral lower leg, and a large skin abrasion on his left forearm.  Objective: 3 cm laceration on left eyebrow, clean, just above the eyebrow  Approximately 4 by 6 cm towards skin fold on left forearm. Half of the skin was:Marland Kitchen The other half was peeled back in place to cover the wound, and sealed with Dermabond. Approximately 7.5 cm of seal was done with the Dermabond. The remainder of the wound was dressed with Xeroform gauze. It  The places on the lower leg, just below the knee, mid shin, and lateral to the shin, all cleaned and dressed with a small amount of gauze also. The patient tolerated the procedures well.  Assessment: Multiple lacerations of face arm and leg  Plan: Return in 6 days for sutures removed, sooner if necessary Instructed him on care of the wounds in various places. Td AP  Return sooner if problems

## 2012-04-29 NOTE — Patient Instructions (Signed)
WOUND CARE Please return in 6 days to have your stitches/staples removed or sooner if you have concerns. Marland Kitchen Keep area clean and dry for 24 hours. Do not remove bandage, if applied. . After 24 hours, remove bandage and wash wound gently with mild soap and warm water. Reapply a new bandage after cleaning wound, if directed. . Continue daily cleansing with soap and water until stitches/staples are removed. . Do not apply any ointments or creams to the wound while stitches/staples are in place, as this may cause delayed healing. . Notify the office if you experience any of the following signs of infection: Swelling, redness, pus drainage, streaking, fever >101.0 F . Notify the office if you experience excessive bleeding that does not stop after 15-20 minutes of constant, firm pressure.    Change the dressings on the arms and leg every couple of days. Use a stick free dressing. Return if any concerns of infection

## 2012-04-29 NOTE — Progress Notes (Signed)
Procedure: Consent obtained.  Local anesthesia with 2% lido with epi to wound.  Wound cleaned.  Closed with 5-0 Ethilon #4 SI sutures.  Wound care d/w pt.  Suture removal 6 days.

## 2012-05-05 ENCOUNTER — Ambulatory Visit (INDEPENDENT_AMBULATORY_CARE_PROVIDER_SITE_OTHER): Payer: Medicare Other | Admitting: Family Medicine

## 2012-05-05 VITALS — BP 132/80 | HR 88 | Temp 98.1°F | Resp 18 | Ht 71.0 in | Wt 235.0 lb

## 2012-05-05 DIAGNOSIS — T8130XA Disruption of wound, unspecified, initial encounter: Secondary | ICD-10-CM

## 2012-05-05 DIAGNOSIS — S01112D Laceration without foreign body of left eyelid and periocular area, subsequent encounter: Secondary | ICD-10-CM

## 2012-05-05 DIAGNOSIS — S51812D Laceration without foreign body of left forearm, subsequent encounter: Secondary | ICD-10-CM

## 2012-05-05 DIAGNOSIS — Z5189 Encounter for other specified aftercare: Secondary | ICD-10-CM

## 2012-05-05 NOTE — Progress Notes (Signed)
[facility]:  Office Visit  Chief Complaint:  Chief Complaint  Patient presents with  . Suture / Staple Removal    HPI: Jordan Clayton is a 72 y.o. male who complains of  Here for recheck lac s/p suture removal and also laceration of left forearm.  Elderly diabetic man who was fishing with his grandson. He went down the back to get the loose, and once he got it freed up he started back up the back, she fell off, he fell down, he got multiple wounds. He came in here to get checked. He had his protime checked for his warfarin a few days ago and the INR was 2.7. Last HbA1c was 6. He denies any fevers, chills.    Past Medical History  Diagnosis Date  . Delirium   . Hypernatremia   . Septic shock(785.52)   . Acute MI     subendocardial  . CAD (coronary artery disease)   . Chronic atrial fibrillation   . Drug therapy     coumadin  . Mixed hyperlipidemia   . HTN (hypertension)   . DM2 (diabetes mellitus, type 2)    Past Surgical History  Procedure Laterality Date  . Coronary artery bypass graft  1993   History   Social History  . Marital Status: Married    Spouse Name: N/A    Number of Children: N/A  . Years of Education: N/A   Social History Main Topics  . Smoking status: Never Smoker   . Smokeless tobacco: None  . Alcohol Use: No  . Drug Use: No  . Sexually Active: None   Other Topics Concern  . None   Social History Narrative   Married; retired; disabled, does not get regular exercise.    Family History  Problem Relation Age of Onset  . Coronary artery disease      family hx  . Hypertension      family hx   Allergies  Allergen Reactions  . Atorvastatin   . Oxycodone-Acetaminophen     REACTION: Hallucinations  . Quetiapine     REACTION: disorientated  . Rosuvastatin     REACTION: Reaction not known   Prior to Admission medications   Medication Sig Start Date End Date Taking? Authorizing Provider  aspirin 81 MG EC tablet Take 81  mg by mouth daily.     Yes Historical Provider, MD  folic acid (FOLVITE) 800 MCG tablet Take 400 mcg by mouth daily.     Yes Historical Provider, MD  furosemide (LASIX) 40 MG tablet Take 1 tablet (40 mg total) by mouth daily as needed. Daily as needed for swelling 08/31/11  Yes Gaylord Shih, MD  metFORMIN (GLUCOPHAGE) 1000 MG tablet Take 1,000 mg by mouth 2 (two) times daily.     Yes Historical Provider, MD  metoprolol (LOPRESSOR) 100 MG tablet Take 1 tablet (100 mg total) by mouth 2 (two) times daily. 02/03/12  Yes Gaylord Shih, MD  Multiple Vitamin (MULTIVITAMIN) tablet Take 1 tablet by mouth daily.     Yes Historical Provider, MD  nitroGLYCERIN (NITROSTAT) 0.4 MG SL tablet Place 1 tablet (0.4 mg total) under the tongue every 5 (five) minutes as needed. 08/31/11  Yes Gaylord Shih, MD  nutritional supplement (BOOST HIGH PROTEIN) LIQD Take 237 mLs by mouth daily.     Yes Historical Provider, MD  pravastatin (PRAVACHOL) 20 MG tablet Take 20 mg by mouth daily.     Yes Historical Provider, MD  quinapril (ACCUPRIL) 20 MG tablet Take 20 mg by mouth at bedtime. 08/28/10  Yes Gaylord Shih, MD  quinapril (ACCUPRIL) 20 MG tablet TAKE ONE TABLET BY MOUTH AT BEDTIME 09/09/11  Yes Gaylord Shih, MD  Thiamine HCl (VITAMIN B-1) 100 MG tablet Take 100 mg by mouth daily.     Yes Historical Provider, MD  vitamin B-12 (CYANOCOBALAMIN) 250 MCG tablet Take 250 mcg by mouth daily.     Yes Historical Provider, MD  warfarin (COUMADIN) 5 MG tablet TAKE AS DIRECTED BY ANTICOAGULATION 04/10/12  Yes Gaylord Shih, MD  warfarin (COUMADIN) 7.5 MG tablet TAKE AS DIRECTED BY ANTICOAGULATION CLINIC 04/10/12  Yes Gaylord Shih, MD     ROS: The patient denies fevers, chills, night sweats, unintentional weight loss, chest pain, palpitations, wheezing, dyspnea on exertion, nausea, vomiting, abdominal pain, dysuria, hematuria, melena, numbness, weakness, or tingling.   All other systems have been reviewed and were otherwise negative with  the exception of those mentioned in the HPI and as above.    PHYSICAL EXAM: Filed Vitals:   05/05/12 1143  BP: 132/80  Pulse: 88  Temp: 98.1 F (36.7 C)  Resp: 18   Filed Vitals:   05/05/12 1143  Height: 5\' 11"  (1.803 m)  Weight: 235 lb (106.595 kg)   Body mass index is 32.79 kg/(m^2).  General: Alert, no acute distress HEENT:  Normocephalic, atraumatic, oropharynx patent.  Cardiovascular:  Irregular rate and rhythm, no rubs murmurs or gallops.  No Carotid bruits, radial pulse intact. No pedal edema.  Respiratory: Clear to auscultation bilaterally.  No wheezes, rales, or rhonchi.  No cyanosis, no use of accessory musculature GI: No organomegaly, abdomen is soft and non-tender, positive bowel sounds.  No masses. Skin: + laceration of left eyebroa nad lacreation of froarm, wounds look clean and healing well. Xeroform stuck to wound nad bleeding due to coumadin Neurologic: Facial musculature symmetric. Psychiatric: Patient is appropriate throughout our interaction. Lymphatic: No cervical lymphadenopathy Musculoskeletal: Gait intact.   LABS: Results for orders placed in visit on 04/27/12  POCT INR      Result Value Range   INR 2.7       EKG/XRAY:   Primary read interpreted by Dr. Conley Rolls at [facility].   ASSESSMENT/PLAN: Encounter Diagnoses  Name Primary?  . Forearm laceration with complication, left, subsequent encounter Yes  . Eyebrow laceration, left, subsequent encounter   . Disruption of wound, unspecified    Wound still bleeding after cleaning at home, xerofrom stuck to wound Spent 30 minutes trying to remove dressing and also holding pressure since patient was bleeding and on coumadin Able to stop bleeding Wound care as directed.  F/u prn   Shalena Ezzell PHUONG, DO 05/05/2012 1:02 PM

## 2012-06-07 ENCOUNTER — Ambulatory Visit (INDEPENDENT_AMBULATORY_CARE_PROVIDER_SITE_OTHER): Payer: Medicare Other | Admitting: *Deleted

## 2012-06-07 DIAGNOSIS — I4891 Unspecified atrial fibrillation: Secondary | ICD-10-CM

## 2012-06-07 DIAGNOSIS — Z7901 Long term (current) use of anticoagulants: Secondary | ICD-10-CM

## 2012-07-19 ENCOUNTER — Ambulatory Visit (INDEPENDENT_AMBULATORY_CARE_PROVIDER_SITE_OTHER): Payer: Medicare Other | Admitting: *Deleted

## 2012-07-19 DIAGNOSIS — Z7901 Long term (current) use of anticoagulants: Secondary | ICD-10-CM

## 2012-07-19 DIAGNOSIS — I4891 Unspecified atrial fibrillation: Secondary | ICD-10-CM

## 2012-07-19 LAB — POCT INR: INR: 3

## 2012-07-25 ENCOUNTER — Other Ambulatory Visit: Payer: Self-pay | Admitting: Cardiology

## 2012-08-30 ENCOUNTER — Ambulatory Visit (INDEPENDENT_AMBULATORY_CARE_PROVIDER_SITE_OTHER): Payer: Medicare Other | Admitting: *Deleted

## 2012-08-30 DIAGNOSIS — I4891 Unspecified atrial fibrillation: Secondary | ICD-10-CM

## 2012-08-30 DIAGNOSIS — Z7901 Long term (current) use of anticoagulants: Secondary | ICD-10-CM

## 2012-09-13 ENCOUNTER — Ambulatory Visit (INDEPENDENT_AMBULATORY_CARE_PROVIDER_SITE_OTHER): Payer: Medicare Other | Admitting: Cardiology

## 2012-09-13 ENCOUNTER — Encounter: Payer: Self-pay | Admitting: Cardiology

## 2012-09-13 VITALS — BP 146/81 | HR 78 | Ht 71.0 in | Wt 236.2 lb

## 2012-09-13 DIAGNOSIS — Z7901 Long term (current) use of anticoagulants: Secondary | ICD-10-CM

## 2012-09-13 DIAGNOSIS — E785 Hyperlipidemia, unspecified: Secondary | ICD-10-CM

## 2012-09-13 DIAGNOSIS — I4891 Unspecified atrial fibrillation: Secondary | ICD-10-CM

## 2012-09-13 DIAGNOSIS — I2510 Atherosclerotic heart disease of native coronary artery without angina pectoris: Secondary | ICD-10-CM

## 2012-09-13 DIAGNOSIS — R609 Edema, unspecified: Secondary | ICD-10-CM

## 2012-09-13 DIAGNOSIS — I252 Old myocardial infarction: Secondary | ICD-10-CM

## 2012-09-13 DIAGNOSIS — I10 Essential (primary) hypertension: Secondary | ICD-10-CM

## 2012-09-13 NOTE — Patient Instructions (Addendum)
Your physician wants you to follow-up in: 1 YEAR WITH DR. CRENSHAW.  You will receive a reminder letter in the mail two months in advance. If you don't receive a letter, please call our office to schedule the follow-up appointment.   Your physician recommends that you continue on your current medications as directed. Please refer to the Current Medication list given to you today.  

## 2012-09-13 NOTE — Progress Notes (Signed)
HPI [name] returns today for evaluation and management of his history of coronary artery disease, history bypass surgery, history of myocardial infarction, and chronic atrial fibrillation. He is on anticoagulation. Despite his multiple comorbidities, he continues to remain fairly active and is doing well. He denies any chest discomfort or angina. He denies any palpitations, orthopnea, PND and his edema has been stable. He's had no bleeding on Coumadin.  Past Medical History  Diagnosis Date  . Delirium   . Hypernatremia   . Septic shock(785.52)   . Acute MI     subendocardial  . CAD (coronary artery disease)   . Chronic atrial fibrillation   . Drug therapy     coumadin  . Mixed hyperlipidemia   . HTN (hypertension)   . DM2 (diabetes mellitus, type 2)     Current Outpatient Prescriptions  Medication Sig Dispense Refill  . folic acid (FOLVITE) 800 MCG tablet Take 400 mcg by mouth daily. 1/2 tab daily      . furosemide (LASIX) 40 MG tablet Take 1 tablet (40 mg total) by mouth daily as needed. Daily as needed for swelling  90 tablet  3  . glimepiride (AMARYL) 4 MG tablet Take by mouth daily before breakfast. Take 1/2 tablet daily  Before breakfast      . metFORMIN (GLUCOPHAGE) 1000 MG tablet Take 1,000 mg by mouth 2 (two) times daily.        . metoprolol (LOPRESSOR) 100 MG tablet Take 1 tablet (100 mg total) by mouth 2 (two) times daily.  180 tablet  3  . Multiple Vitamin (MULTIVITAMIN) tablet Take 1 tablet by mouth daily.        . nitroGLYCERIN (NITROSTAT) 0.4 MG SL tablet Place 0.4 mg under the tongue every 5 (five) minutes as needed (up to 3 tablets).      . pravastatin (PRAVACHOL) 40 MG tablet Take 40 mg by mouth daily.      . quinapril (ACCUPRIL) 20 MG tablet Take 20 mg by mouth at bedtime.      . Thiamine HCl (VITAMIN B-1) 100 MG tablet Take 100 mg by mouth daily.        . vitamin B-12 (CYANOCOBALAMIN) 250 MCG tablet Take 250 mcg by mouth daily.        Marland Kitchen warfarin (COUMADIN) 5 MG  tablet TAKE AS DIRECTED BY  ANTICOAGULATION  CLINIC  30 tablet  0  . warfarin (COUMADIN) 7.5 MG tablet TAKE AS DIRECTED BY ANTICOAGULATION CLINIC  90 tablet  0   No current facility-administered medications for this visit.    Allergies  Allergen Reactions  . Atorvastatin   . Oxycodone-Acetaminophen     REACTION: Hallucinations  . Quetiapine     REACTION: disorientated  . Rosuvastatin     REACTION: Reaction not known    Family History  Problem Relation Age of Onset  . Coronary artery disease      family hx  . Hypertension      family hx    History   Social History  . Marital Status: Married    Spouse Name: N/A    Number of Children: N/A  . Years of Education: N/A   Occupational History  . Not on file.   Social History Main Topics  . Smoking status: Never Smoker   . Smokeless tobacco: Not on file  . Alcohol Use: No  . Drug Use: No  . Sexual Activity: Not on file   Other Topics Concern  . Not on file  Social History Narrative   Married; retired; disabled, does not get regular exercise.     ROS ALL NEGATIVE EXCEPT THOSE NOTED IN HPI  PE  General Appearance: well developed, well nourished in no acute distress, obese HEENT: symmetrical face, PERRLA,  Neck: no JVD, thyromegaly, or adenopathy, trachea midline Chest: symmetric without deformity Cardiac: PMI non-displaced, irregular rate and rhythm normal S1, S2, no gallop or murmur Lung: clear to ausculation and percussion Vascular: all pulses full without bruits  Abdominal: nondistended, nontender, good bowel sounds, no HSM, no bruits Extremities: no cyanosiis, 1+ in the left lower extremity, right is chronically edematous from previous infection and injury. He has it wrapped. no sign of DVT, no varicosities  Skin: normal color, no rashes Neuro: alert and oriented x 3, non-focal Pysch: normal affect  EKG Chronic A. fib, well-controlled ventricular rate, otherwise normal EKG BMET    Component Value  Date/Time   NA 140 08/26/2010 1150   K 4.6 08/26/2010 1150   CL 107 08/26/2010 1150   CO2 26 08/26/2010 1150   GLUCOSE 111* 08/26/2010 1150   BUN 33* 08/26/2010 1150   CREATININE 1.0 08/26/2010 1150   CALCIUM 9.5 08/26/2010 1150   GFRNONAA 58* 12/03/2009 1038   GFRAA  Value: >60        The eGFR has been calculated using the MDRD equation. This calculation has not been validated in all clinical situations. eGFR's persistently <60 mL/min signify possible Chronic Kidney Disease. 12/03/2009 1038    Lipid Panel     Component Value Date/Time   CHOL  Value: 121        ATP III CLASSIFICATION:  <200     mg/dL   Desirable  161-096  mg/dL   Borderline High  >=045    mg/dL   High        4/0/9811 0341   TRIG 147 09/01/2009 0341   HDL 25* 09/01/2009 0341   CHOLHDL 4.8 09/01/2009 0341   VLDL 29 09/01/2009 0341   LDLCALC  Value: 67        Total Cholesterol/HDL:CHD Risk Coronary Heart Disease Risk Table                     Men   Women  1/2 Average Risk   3.4   3.3  Average Risk       5.0   4.4  2 X Average Risk   9.6   7.1  3 X Average Risk  23.4   11.0        Use the calculated Patient Ratio above and the CHD Risk Table to determine the patient's CHD Risk.        ATP III CLASSIFICATION (LDL):  <100     mg/dL   Optimal  914-782  mg/dL   Near or Above                    Optimal  130-159  mg/dL   Borderline  956-213  mg/dL   High  >086     mg/dL   Very High 06/01/8467 6295    CBC    Component Value Date/Time   WBC 9.1 08/26/2010 1150   RBC 4.20* 08/26/2010 1150   HGB 13.4 08/26/2010 1150   HCT 40.7 08/26/2010 1150   PLT 214.0 08/26/2010 1150   MCV 96.9 08/26/2010 1150   MCH 29.9 12/03/2009 1038   MCHC 32.9 08/26/2010 1150   RDW 14.2 08/26/2010 1150   LYMPHSABS 1.9 08/26/2010  1150   MONOABS 1.1* 08/26/2010 1150   EOSABS 0.3 08/26/2010 1150   BASOSABS 0.0 08/26/2010 1150

## 2012-09-13 NOTE — Assessment & Plan Note (Signed)
Asymptomatic and stable. Continue medical therapy.

## 2012-09-13 NOTE — Assessment & Plan Note (Signed)
Asymptomatic with good rate control and anticoagulation. I'll have him followup with Dr. Jens Som in a year.

## 2012-09-25 ENCOUNTER — Other Ambulatory Visit: Payer: Self-pay | Admitting: Cardiology

## 2012-10-11 ENCOUNTER — Ambulatory Visit (INDEPENDENT_AMBULATORY_CARE_PROVIDER_SITE_OTHER): Payer: Medicare Other | Admitting: *Deleted

## 2012-10-11 DIAGNOSIS — I4891 Unspecified atrial fibrillation: Secondary | ICD-10-CM

## 2012-10-11 DIAGNOSIS — Z7901 Long term (current) use of anticoagulants: Secondary | ICD-10-CM

## 2012-11-08 ENCOUNTER — Telehealth: Payer: Self-pay | Admitting: Cardiology

## 2012-11-08 NOTE — Telephone Encounter (Signed)
New problem  Pt called in and is having chest pain. Sent patient to triage nurse/cc

## 2012-11-08 NOTE — Telephone Encounter (Signed)
Spoke with pt's wife regarding pt which is having a episode of chest tightness this morning at 11:00 AM while walking in the store. Pt states pt is fine now , he did not need to take NTG chest tightness went away by it self after resting. Pt denies any  other symptoms. Pt states he is not more sob than usual. Pt would like to be seen today. Pt and wife are aware that Dr. Jens Som is not in the office today and his schedule is full tomorrow and no other openings  this week. An appointment was made for pt on Monday 11/13/12 with Norma Fredrickson NP at 10:30 AM.  Pt and wife aware to go to the ER if symptoms return and are getting worse.

## 2012-11-13 ENCOUNTER — Ambulatory Visit (INDEPENDENT_AMBULATORY_CARE_PROVIDER_SITE_OTHER): Payer: Medicare Other | Admitting: Nurse Practitioner

## 2012-11-13 ENCOUNTER — Encounter: Payer: Self-pay | Admitting: Nurse Practitioner

## 2012-11-13 VITALS — BP 130/80 | HR 72 | Ht 71.0 in | Wt 235.0 lb

## 2012-11-13 DIAGNOSIS — I259 Chronic ischemic heart disease, unspecified: Secondary | ICD-10-CM

## 2012-11-13 DIAGNOSIS — R079 Chest pain, unspecified: Secondary | ICD-10-CM

## 2012-11-13 MED ORDER — NITROGLYCERIN 0.4 MG SL SUBL: 0.4000 mg | SUBLINGUAL_TABLET | SUBLINGUAL | Status: DC | PRN

## 2012-11-13 NOTE — Progress Notes (Signed)
Jordan Clayton Date of Birth: 01-29-1940 Medical Record #[id number]  History of Present Illness: Jordan Clayton is seen back today for a work in visit. Former patient of Dr. Vern Claude. Has known CAD with remote MI & CABG followed by stent to the RCA in 2002 and again x 2 in 2006, other issues include chronic atrial fib, HLD, DM and HTN. He has had sepsis on 2 different occasions.   Last seen here in August and was doing ok. Has been referred to Dr. Jens Som for follow up.   Comes back today. Here with his wife. Has done ok since his last visit here. Has been slowing down. Not exercising due to chronic issues with his right leg - has had prior flesh eating bacteria associated with sepsis x 2. Chronic edema. Does try to work in his garden. Last Wednesday they were at the [employer] - looking around in the store - had chest tightness for about 10 minutes - did not use any NTG - needs it refilled. No real associated symptoms but it did get his attention. Has not recurred since. He will typically have back pain as his angina - has not had that. A1C is 6. Has lipids with his PCP. Has not had follow up testing in many years.   Current Outpatient Prescriptions  Medication Sig Dispense Refill  . folic acid (FOLVITE) 800 MCG tablet Take 400 mcg by mouth daily. 1/2 tab daily      . furosemide (LASIX) 40 MG tablet Take 1 tablet (40 mg total) by mouth daily as needed. Daily as needed for swelling  90 tablet  3  . glimepiride (AMARYL) 4 MG tablet Take by mouth daily before breakfast. Take 1/2 tablet daily  Before breakfast      . metFORMIN (GLUCOPHAGE) 1000 MG tablet Take 1,000 mg by mouth 2 (two) times daily.        . metoprolol (LOPRESSOR) 100 MG tablet Take 1 tablet (100 mg total) by mouth 2 (two) times daily.  180 tablet  3  . Multiple Vitamin (MULTIVITAMIN) tablet Take 1 tablet by mouth daily.        . nitroGLYCERIN (NITROSTAT) 0.4 MG SL tablet Place 0.4 mg under the tongue every 5 (five) minutes as needed  (up to 3 tablets).      . pravastatin (PRAVACHOL) 40 MG tablet Take 40 mg by mouth daily.      . quinapril (ACCUPRIL) 20 MG tablet Take 20 mg by mouth at bedtime.      . Thiamine HCl (VITAMIN B-1) 100 MG tablet Take 100 mg by mouth daily.        . vitamin B-12 (CYANOCOBALAMIN) 250 MCG tablet Take 250 mcg by mouth daily.        Marland Kitchen warfarin (COUMADIN) 5 MG tablet TAKE AS DIRECTED BY  ANTICOAGULATION  CLINIC  30 tablet  0  . warfarin (COUMADIN) 7.5 MG tablet TAKE AS DIRECTED BY ANTICOAGULATION CLINIC  90 tablet  0   No current facility-administered medications for this visit.    Allergies  Allergen Reactions  . Atorvastatin   . Oxycodone-Acetaminophen     REACTION: Hallucinations  . Quetiapine     REACTION: disorientated  . Rosuvastatin     REACTION: Reaction not known    Past Medical History  Diagnosis Date  . Delirium   . Hypernatremia   . Septic shock(785.52)   . Acute MI     subendocardial  . CAD (coronary artery disease)   .  Chronic atrial fibrillation   . Drug therapy     coumadin  . Mixed hyperlipidemia   . HTN (hypertension)   . DM2 (diabetes mellitus, type 2)     Past Surgical History  Procedure Laterality Date  . Coronary artery bypass graft  1993    History  Smoking status  . Never Smoker   Smokeless tobacco  . Not on file    History  Alcohol Use No    Family History  Problem Relation Age of Onset  . Coronary artery disease      family hx  . Hypertension      family hx    Review of Systems: The review of systems is per the HPI.  All other systems were reviewed and are negative.  Physical Exam: BP 130/80  Pulse 72  Ht 5\' 11"  (1.803 m)  Wt 235 lb (106.595 kg)  BMI 32.79 kg/m2 Patient is very pleasant and in no acute distress. Skin is warm and dry. Color is normal.  HEENT is unremarkable but has missing teeth. Normocephalic/atraumatic. PERRL. Sclera are nonicteric. Neck is supple. No masses. No JVD. Lungs are clear. Cardiac exam shows an  irregular rhythm. Rate is controlled. Abdomen is soft. Extremities are with 1+ edema on the right - he has a support stocking in place. No edema on the left. Gait and ROM are intact. No gross neurologic deficits noted.  LABORATORY DATA: EKG shows atrial fib with a controlled VR   Lab Results  Component Value Date   WBC 9.1 08/26/2010   HGB 13.4 08/26/2010   HCT 40.7 08/26/2010   PLT 214.0 08/26/2010   GLUCOSE 111* 08/26/2010   CHOL  Value: 121        ATP III CLASSIFICATION:  <200     mg/dL   Desirable  161-096  mg/dL   Borderline High  >=045    mg/dL   High        4/0/9811   TRIG 147 09/01/2009   HDL 25* 09/01/2009   LDLCALC  Value: 67        Total Cholesterol/HDL:CHD Risk Coronary Heart Disease Risk Table                     Men   Women  1/2 Average Risk   3.4   3.3  Average Risk       5.0   4.4  2 X Average Risk   9.6   7.1  3 X Average Risk  23.4   11.0        Use the calculated Patient Ratio above and the CHD Risk Table to determine the patient's CHD Risk.        ATP III CLASSIFICATION (LDL):  <100     mg/dL   Optimal  914-782  mg/dL   Near or Above                    Optimal  130-159  mg/dL   Borderline  956-213  mg/dL   High  >086     mg/dL   Very High 06/01/8467   ALT 18 12/03/2009   AST 23 12/03/2009   NA 140 08/26/2010   K 4.6 08/26/2010   CL 107 08/26/2010   CREATININE 1.0 08/26/2010   BUN 33* 08/26/2010   CO2 26 08/26/2010   INR 3.2 10/11/2012   HGBA1C  Value: 6.2 (NOTE)  According to the ADA Clinical Practice Recommendations for 2011, when HbA1c is used as a screening test:   >=6.5%   Diagnostic of Diabetes Mellitus           (if abnormal result  is confirmed)  5.7-6.4%   Increased risk of developing Diabetes Mellitus  References:Diagnosis and Classification of Diabetes Mellitus,Diabetes Care,2011,34(Suppl 1):S62-S69 and Standards of Medical Care in         Diabetes - 2011,Diabetes Care,2011,34  (Suppl 1):S11-S61.* 07/31/2009   Lab Results    Component Value Date   INR 3.2 10/11/2012   INR 2.8 08/30/2012   INR 3.0 07/19/2012   PROTIME 19.4 06/20/2008    Assessment / Plan: 1. Chest pain - long history of CAD - with remote CABG in 1994, PCI with several stents in the RCA - no follow up that I can see since 2006. Will arrange for Lexiscan to risk stratify. Refilled his NTG. Tentatively see him back as planned in August unless his study is abnormal or if he has persistent symptoms.   2. HTN - BP looks ok  3. Atrial fib - rate controlled - on chronic coumadin.  Patient is agreeable to this plan and will call if any problems develop in the interim.   Rosalio Macadamia, RN, ANP-C [facility] [address] [place], [place]  [address]

## 2012-11-13 NOTE — Patient Instructions (Signed)
Stay on your current medicines  I have refilled the NTG today  We are going to arrange for a stress test Eugenie Birks) - if this is ok - see Dr. Jens Som back in August.  If you have more chest pain/tightness - let me know  Call the [facility] office at [phone] if you have any questions, problems or concerns.

## 2012-11-21 ENCOUNTER — Encounter: Payer: Self-pay | Admitting: Cardiology

## 2012-11-22 ENCOUNTER — Ambulatory Visit (INDEPENDENT_AMBULATORY_CARE_PROVIDER_SITE_OTHER): Payer: Medicare Other | Admitting: Pharmacist

## 2012-11-22 DIAGNOSIS — Z7901 Long term (current) use of anticoagulants: Secondary | ICD-10-CM

## 2012-11-22 DIAGNOSIS — I4891 Unspecified atrial fibrillation: Secondary | ICD-10-CM

## 2012-11-22 LAB — POCT INR: INR: 2.1

## 2012-11-27 ENCOUNTER — Other Ambulatory Visit: Payer: Self-pay | Admitting: Cardiology

## 2012-11-30 ENCOUNTER — Ambulatory Visit (HOSPITAL_COMMUNITY): Payer: Medicare Other | Attending: Nurse Practitioner | Admitting: Radiology

## 2012-11-30 VITALS — BP 134/78 | HR 55 | Ht 71.0 in | Wt 233.0 lb

## 2012-11-30 DIAGNOSIS — I2510 Atherosclerotic heart disease of native coronary artery without angina pectoris: Secondary | ICD-10-CM | POA: Insufficient documentation

## 2012-11-30 DIAGNOSIS — R0609 Other forms of dyspnea: Secondary | ICD-10-CM | POA: Insufficient documentation

## 2012-11-30 DIAGNOSIS — R0789 Other chest pain: Secondary | ICD-10-CM | POA: Insufficient documentation

## 2012-11-30 DIAGNOSIS — I252 Old myocardial infarction: Secondary | ICD-10-CM | POA: Insufficient documentation

## 2012-11-30 DIAGNOSIS — R0602 Shortness of breath: Secondary | ICD-10-CM

## 2012-11-30 DIAGNOSIS — I259 Chronic ischemic heart disease, unspecified: Secondary | ICD-10-CM

## 2012-11-30 DIAGNOSIS — Z9861 Coronary angioplasty status: Secondary | ICD-10-CM | POA: Insufficient documentation

## 2012-11-30 DIAGNOSIS — Z8249 Family history of ischemic heart disease and other diseases of the circulatory system: Secondary | ICD-10-CM | POA: Insufficient documentation

## 2012-11-30 DIAGNOSIS — E785 Hyperlipidemia, unspecified: Secondary | ICD-10-CM | POA: Insufficient documentation

## 2012-11-30 DIAGNOSIS — I10 Essential (primary) hypertension: Secondary | ICD-10-CM | POA: Insufficient documentation

## 2012-11-30 DIAGNOSIS — E119 Type 2 diabetes mellitus without complications: Secondary | ICD-10-CM | POA: Insufficient documentation

## 2012-11-30 DIAGNOSIS — R0989 Other specified symptoms and signs involving the circulatory and respiratory systems: Secondary | ICD-10-CM | POA: Insufficient documentation

## 2012-11-30 DIAGNOSIS — R079 Chest pain, unspecified: Secondary | ICD-10-CM

## 2012-11-30 MED ORDER — TECHNETIUM TC 99M SESTAMIBI GENERIC - CARDIOLITE
11.0000 | Freq: Once | INTRAVENOUS | Status: AC | PRN
  Administered 2012-11-30: 11 via INTRAVENOUS

## 2012-11-30 MED ORDER — REGADENOSON 0.4 MG/5ML IV SOLN
0.4000 mg | Freq: Once | INTRAVENOUS | Status: AC
  Administered 2012-11-30: 0.4 mg via INTRAVENOUS

## 2012-11-30 MED ORDER — TECHNETIUM TC 99M SESTAMIBI GENERIC - CARDIOLITE
33.0000 | Freq: Once | INTRAVENOUS | Status: AC | PRN
  Administered 2012-11-30: 33 via INTRAVENOUS

## 2012-11-30 NOTE — Progress Notes (Signed)
[facility] SITE 3 NUCLEAR MED [address] [place], [place] [address] [phone]    Cardiology Nuclear Med Study  Jordan Clayton is a 72 y.o. male     MRN : [id number]     DOB: 07-27-1940  Procedure Date: 11/30/2012  Nuclear Med Background Indication for Stress Test:  Evaluation for Ischemia, Graft and Stent Patency History:  CAD; MI; CABG; Stent x 2; Echo; 9/11 [id number] infarct, EF=45% Cardiac Risk Factors: Family History - CAD, Hypertension, Lipids and NIDDM  Symptoms:  Chest Tightness with Exertion (last episode of chest discomfort was about a month ago) and DOE   Nuclear Pre-Procedure Caffeine/Decaff Intake:  None > 12 hrs NPO After: 6:30pm   Lungs:  Clear. O2 Sat: 96% on room air. IV 0.9% NS with Angio Cath:  20g  IV Site: R Antecubital x 1, tolerated well IV Started by:  Irean Hong, RN  Chest Size (in):  46 Cup Size: n/a  Height: 5\' 11"  (1.803 m)  Weight:  233 lb (105.688 kg)  BMI:  Body mass index is 32.51 kg/(m^2). Tech Comments:  Took Metoprolol this am. Fasting CBG was 132 at 0800 today. Irean Hong, RN.    Nuclear Med Study 1 or 2 day study: 1 day  Stress Test Type:  Lexiscan  Reading MD: Willa Rough, MD  Order Authorizing Provider:  Olga Millers, MD, and Norma Fredrickson, NP  Resting Radionuclide: Technetium 27m Sestamibi  Resting Radionuclide Dose: 11.0 mCi   Stress Radionuclide:  Technetium 80m Sestamibi  Stress Radionuclide Dose: 33.0 mCi           Stress Protocol Rest HR: 55 Stress HR: 80  Rest BP: 134/78 Stress BP: 146/83  Exercise Time (min): n/a METS: n/a   Predicted Max HR: 146 bpm % Max HR: 54.79 bpm Rate Pressure Product: 27253   Dose of Adenosine (mg):  n/a Dose of Lexiscan: 0.4 mg  Dose of Atropine (mg): n/a Dose of Dobutamine: n/a mcg/kg/min (at max HR)  Stress Test Technologist: Smiley Houseman, CMA-N  Nuclear Technologist:  Doyne Keel, CNMT     Rest Procedure:  Myocardial perfusion imaging was performed at rest 45  minutes following the intravenous administration of Technetium 2m Sestamibi.  Rest ECG: Atrial fibrillation with controlled rate.  Stress Procedure:  The patient received IV Lexiscan 0.4 mg over 15-seconds.  Technetium 82m Sestamibi injected at 30-seconds.  Patient c/o chest tightness and headache with Lexiscan.  Quantitative spect images were obtained after a 45 minute delay.  Stress ECG: No significant change from baseline ECG  QPS Raw Data Images:  Normal; no motion artifact; normal heart/lung ratio. Stress Images:  There is mild decreased activity in the inferior wall. Rest Images:  Mild decreased activity in the inferior wall. Subtraction (SDS):  No evidence of ischemia Transient Ischemic Dilatation (Normal <1.22):  1.05 Lung/Heart Ratio (Normal <0.45):  0.40  Quantitative Gated Spect Images QGS EDV:  n/a ml QGS ESV:  n/a ml  Impression Exercise Capacity:  Lexiscan with no exercise. BP Response:  Normal blood pressure response. Clinical Symptoms:  Chest tightness ECG Impression:  No significant ST segment change suggestive of ischemia. Comparison with Prior Nuclear Study: The study is compared with the report of the study from September, 2011  Overall Impression:  There is some decreased activity in the inferior wall. This was noted on the prior study in 2011. In 2011 there was inferior hypokinesis. Therefore this abnormality was called scar. Currently there is no wall motion analysis  available. Therefore the finding must be called either scar or diaphragmatic attenuation. It appears there has been no significant change in the tomographic images. There is no definite ischemia at this time.  LV Ejection Fraction: Study not gated.  LV Wall Motion:  Wall motion cannot be assessed because the study could not be gated.  Willa Rough, MD

## 2012-12-01 ENCOUNTER — Telehealth: Payer: Self-pay | Admitting: Nurse Practitioner

## 2012-12-01 NOTE — Telephone Encounter (Signed)
Notified wife of stress test results. Will call back if has any symptoms.

## 2012-12-01 NOTE — Telephone Encounter (Signed)
Follow Up  Pt retuning calls for results

## 2013-01-03 ENCOUNTER — Ambulatory Visit (INDEPENDENT_AMBULATORY_CARE_PROVIDER_SITE_OTHER): Payer: Medicare Other | Admitting: Pharmacist

## 2013-01-03 DIAGNOSIS — I4891 Unspecified atrial fibrillation: Secondary | ICD-10-CM

## 2013-01-03 DIAGNOSIS — Z7901 Long term (current) use of anticoagulants: Secondary | ICD-10-CM

## 2013-01-03 LAB — POCT INR: INR: 2

## 2013-01-22 ENCOUNTER — Other Ambulatory Visit: Payer: Self-pay | Admitting: Cardiology

## 2013-02-14 ENCOUNTER — Ambulatory Visit (INDEPENDENT_AMBULATORY_CARE_PROVIDER_SITE_OTHER): Payer: Medicare Other | Admitting: *Deleted

## 2013-02-14 DIAGNOSIS — Z7901 Long term (current) use of anticoagulants: Secondary | ICD-10-CM

## 2013-02-14 DIAGNOSIS — I4891 Unspecified atrial fibrillation: Secondary | ICD-10-CM

## 2013-02-14 LAB — POCT INR: INR: 2.4

## 2013-03-28 ENCOUNTER — Ambulatory Visit (INDEPENDENT_AMBULATORY_CARE_PROVIDER_SITE_OTHER): Payer: Medicare Other | Admitting: *Deleted

## 2013-03-28 DIAGNOSIS — I4891 Unspecified atrial fibrillation: Secondary | ICD-10-CM

## 2013-03-28 DIAGNOSIS — Z7901 Long term (current) use of anticoagulants: Secondary | ICD-10-CM

## 2013-03-28 LAB — POCT INR: INR: 2.6

## 2013-03-30 ENCOUNTER — Other Ambulatory Visit: Payer: Self-pay | Admitting: Cardiology

## 2013-05-09 ENCOUNTER — Ambulatory Visit (INDEPENDENT_AMBULATORY_CARE_PROVIDER_SITE_OTHER): Payer: Medicare Other | Admitting: Pharmacist

## 2013-05-09 DIAGNOSIS — I4891 Unspecified atrial fibrillation: Secondary | ICD-10-CM

## 2013-05-09 DIAGNOSIS — Z7901 Long term (current) use of anticoagulants: Secondary | ICD-10-CM

## 2013-05-09 LAB — POCT INR: INR: 3

## 2013-06-20 ENCOUNTER — Ambulatory Visit (INDEPENDENT_AMBULATORY_CARE_PROVIDER_SITE_OTHER): Payer: Medicare Other | Admitting: Pharmacist

## 2013-06-20 DIAGNOSIS — I4891 Unspecified atrial fibrillation: Secondary | ICD-10-CM

## 2013-06-20 DIAGNOSIS — Z7901 Long term (current) use of anticoagulants: Secondary | ICD-10-CM

## 2013-06-20 LAB — POCT INR: INR: 2.5

## 2013-08-01 ENCOUNTER — Ambulatory Visit (INDEPENDENT_AMBULATORY_CARE_PROVIDER_SITE_OTHER): Payer: Medicare Other | Admitting: *Deleted

## 2013-08-01 DIAGNOSIS — I4891 Unspecified atrial fibrillation: Secondary | ICD-10-CM

## 2013-08-01 DIAGNOSIS — Z7901 Long term (current) use of anticoagulants: Secondary | ICD-10-CM

## 2013-08-01 LAB — POCT INR: INR: 2.7

## 2013-09-06 ENCOUNTER — Encounter: Payer: Self-pay | Admitting: Cardiology

## 2013-09-06 ENCOUNTER — Ambulatory Visit (INDEPENDENT_AMBULATORY_CARE_PROVIDER_SITE_OTHER): Payer: Medicare Other | Admitting: Cardiology

## 2013-09-06 VITALS — BP 158/80 | HR 64 | Ht 71.0 in | Wt 237.3 lb

## 2013-09-06 DIAGNOSIS — I2510 Atherosclerotic heart disease of native coronary artery without angina pectoris: Secondary | ICD-10-CM

## 2013-09-06 DIAGNOSIS — I4891 Unspecified atrial fibrillation: Secondary | ICD-10-CM

## 2013-09-06 DIAGNOSIS — I10 Essential (primary) hypertension: Secondary | ICD-10-CM

## 2013-09-06 DIAGNOSIS — I482 Chronic atrial fibrillation, unspecified: Secondary | ICD-10-CM

## 2013-09-06 DIAGNOSIS — E785 Hyperlipidemia, unspecified: Secondary | ICD-10-CM

## 2013-09-06 NOTE — Progress Notes (Signed)
HPI: 73 yo male previously followed by Dr Verl Blalock for fu of CAD and atrial fibrillation. Prior CABG with LIMA to LAD. Cath 2006 showed 100 LAD, 50 D1, 70 distal Lcx, 100 first OM, 75 prox RCA and 99 distal; LIMA patent; 60/70 distal LAD, normal EF. Had PCI of prox and distal RCA and first OM. Echo 7/11 showed EF 45-50 and biatrial enlargement. Nuclear study 10/14 showed inferior defect with no ischemia; not gated. Since last seen, He denies dyspnea, chest pain, palpitations or syncope. Mild edema right lower extremity.   Current Outpatient Prescriptions  Medication Sig Dispense Refill  . Coenzyme Q10 (COQ10 PO) Take 100 mg by mouth daily.      . folic acid (FOLVITE) 349 MCG tablet Take 400 mcg by mouth daily. 1/2 tab daily      . furosemide (LASIX) 40 MG tablet Take 1 tablet (40 mg total) by mouth daily as needed. Daily as needed for swelling  90 tablet  3  . glimepiride (AMARYL) 4 MG tablet Take by mouth daily before breakfast. Take 1/2 tablet daily  Before breakfast      . metFORMIN (GLUCOPHAGE) 1000 MG tablet Take 1,000 mg by mouth 2 (two) times daily.        . metoprolol (LOPRESSOR) 100 MG tablet TAKE ONE TABLET BY MOUTH TWICE DAILY  180 tablet  1  . Multiple Vitamin (MULTIVITAMIN) tablet Take 1 tablet by mouth daily.        . nitroGLYCERIN (NITROSTAT) 0.4 MG SL tablet Place 1 tablet (0.4 mg total) under the tongue every 5 (five) minutes as needed (up to 3 tablets).  25 tablet  6  . pravastatin (PRAVACHOL) 40 MG tablet Take 40 mg by mouth daily.      . quinapril (ACCUPRIL) 20 MG tablet Take 20 mg by mouth at bedtime.      . Thiamine HCl (VITAMIN B-1) 100 MG tablet Take 100 mg by mouth daily.        . vitamin B-12 (CYANOCOBALAMIN) 250 MCG tablet Take 250 mcg by mouth daily.        Marland Kitchen warfarin (COUMADIN) 5 MG tablet TAKE AS DIRECTED BY ANTICOAGULATION CLINIC  30 tablet  3  . warfarin (COUMADIN) 7.5 MG tablet TAKE AS DIRECTED BY ANTICOAGULATION CLINIC  90 tablet  1   No current  facility-administered medications for this visit.     Past Medical History  Diagnosis Date  . Delirium   . Hypernatremia   . Septic shock(785.52)   . Acute MI     subendocardial  . CAD (coronary artery disease)   . Chronic atrial fibrillation   . Drug therapy     coumadin  . Mixed hyperlipidemia   . HTN (hypertension)   . DM2 (diabetes mellitus, type 2)     Past Surgical History  Procedure Laterality Date  . Coronary artery bypass graft  1994    LIMA to LAD per [facility]    History   Social History  . Marital Status: Married    Spouse Name: N/A    Number of Children: N/A  . Years of Education: N/A   Occupational History  . Not on file.   Social History Main Topics  . Smoking status: Never Smoker   . Smokeless tobacco: Not on file  . Alcohol Use: No  . Drug Use: No  . Sexual Activity: Not Currently   Other Topics Concern  . Not on file   Social History Narrative  Married; retired; disabled, does not get regular exercise.     ROS: no fevers or chills, productive cough, hemoptysis, dysphasia, odynophagia, melena, hematochezia, dysuria, hematuria, rash, seizure activity, orthopnea, PND, claudication. Remaining systems are negative.  Physical Exam: Well-developed well-nourished in no acute distress.  Skin is warm and dry.  HEENT is normal.  Neck is supple.  Chest is clear to auscultation with normal expansion.  Cardiovascular exam is irregular Abdominal exam nontender or distended. No masses palpated. Extremities show 1+ RLE edema. neuro grossly intact  ECG Atrial fibrillation at a rate of 64. Cannot rule out prior septal infarct.

## 2013-09-06 NOTE — Assessment & Plan Note (Signed)
Continue metoprolol and Coumadin. Hemoglobin monitored by primary care.

## 2013-09-06 NOTE — Assessment & Plan Note (Signed)
Blood pressure controlled. Continue present medications. Potassium and renal function monitored by primary care. 

## 2013-09-06 NOTE — Assessment & Plan Note (Signed)
Continue statin. Patient has not tolerated high dose Lipitor previously.

## 2013-09-06 NOTE — Patient Instructions (Signed)
Your physician wants you to follow-up in: 6 MONTHS WITH DR CRENSHAW You will receive a reminder letter in the mail two months in advance. If you don't receive a letter, please call our office to schedule the follow-up appointment.  

## 2013-09-06 NOTE — Assessment & Plan Note (Addendum)
Continue statin. Not on aspirin given need for Coumadin. Recent nuclear study showed no ischemia. Note patient is new to me and 20 minutes spent reviewing records prior to office visit.

## 2013-09-12 ENCOUNTER — Ambulatory Visit (INDEPENDENT_AMBULATORY_CARE_PROVIDER_SITE_OTHER): Payer: Medicare Other | Admitting: *Deleted

## 2013-09-12 DIAGNOSIS — Z7901 Long term (current) use of anticoagulants: Secondary | ICD-10-CM

## 2013-09-12 DIAGNOSIS — I4891 Unspecified atrial fibrillation: Secondary | ICD-10-CM

## 2013-09-12 LAB — POCT INR: INR: 2.7

## 2013-09-13 ENCOUNTER — Ambulatory Visit: Payer: Medicare Other | Admitting: Cardiology

## 2013-09-17 ENCOUNTER — Other Ambulatory Visit: Payer: Self-pay | Admitting: Physician Assistant

## 2013-09-17 ENCOUNTER — Other Ambulatory Visit: Payer: Self-pay | Admitting: Cardiology

## 2013-09-18 ENCOUNTER — Other Ambulatory Visit: Payer: Self-pay | Admitting: Cardiology

## 2013-10-16 ENCOUNTER — Other Ambulatory Visit: Payer: Self-pay

## 2013-10-16 MED ORDER — METOPROLOL TARTRATE 100 MG PO TABS: ORAL_TABLET | ORAL | Status: DC

## 2013-10-24 ENCOUNTER — Ambulatory Visit (INDEPENDENT_AMBULATORY_CARE_PROVIDER_SITE_OTHER): Payer: Medicare Other | Admitting: Pharmacist

## 2013-10-24 DIAGNOSIS — I4891 Unspecified atrial fibrillation: Secondary | ICD-10-CM

## 2013-10-24 DIAGNOSIS — Z7901 Long term (current) use of anticoagulants: Secondary | ICD-10-CM

## 2013-10-24 LAB — POCT INR: INR: 2.6

## 2013-12-05 ENCOUNTER — Ambulatory Visit (INDEPENDENT_AMBULATORY_CARE_PROVIDER_SITE_OTHER): Payer: Medicare Other | Admitting: *Deleted

## 2013-12-05 DIAGNOSIS — Z7901 Long term (current) use of anticoagulants: Secondary | ICD-10-CM

## 2013-12-05 DIAGNOSIS — I4891 Unspecified atrial fibrillation: Secondary | ICD-10-CM

## 2013-12-05 LAB — POCT INR: INR: 2.5

## 2013-12-25 ENCOUNTER — Telehealth: Payer: Self-pay | Admitting: Gynecology

## 2013-12-25 ENCOUNTER — Other Ambulatory Visit: Payer: Self-pay

## 2013-12-25 MED ORDER — QUINAPRIL HCL 20 MG PO TABS: 20.0000 mg | ORAL_TABLET | Freq: Every day | ORAL | Status: DC

## 2013-12-25 NOTE — Telephone Encounter (Signed)
The doctor should be Dr Verlon Setting Dr Rolin Barry.

## 2013-12-25 NOTE — Telephone Encounter (Signed)
Pt still have not received his Quinapril.Please call this in today if possible to [phone]

## 2014-01-03 ENCOUNTER — Other Ambulatory Visit: Payer: Self-pay | Admitting: Cardiology

## 2014-01-03 ENCOUNTER — Other Ambulatory Visit: Payer: Self-pay | Admitting: Pharmacist Clinician (PhC)/ Clinical Pharmacy Specialist

## 2014-01-03 MED ORDER — WARFARIN SODIUM 5 MG PO TABS: ORAL_TABLET | ORAL | Status: DC

## 2014-01-03 NOTE — Telephone Encounter (Signed)
Pt still have not received his Warfarin 5mg . Would you please call it in today to [phone].

## 2014-01-16 ENCOUNTER — Ambulatory Visit (INDEPENDENT_AMBULATORY_CARE_PROVIDER_SITE_OTHER): Payer: Medicare Other | Admitting: *Deleted

## 2014-01-16 DIAGNOSIS — Z7901 Long term (current) use of anticoagulants: Secondary | ICD-10-CM

## 2014-01-16 DIAGNOSIS — I4891 Unspecified atrial fibrillation: Secondary | ICD-10-CM

## 2014-01-16 LAB — POCT INR: INR: 2.5

## 2014-02-27 ENCOUNTER — Ambulatory Visit (INDEPENDENT_AMBULATORY_CARE_PROVIDER_SITE_OTHER): Payer: Medicare Other | Admitting: *Deleted

## 2014-02-27 DIAGNOSIS — Z7901 Long term (current) use of anticoagulants: Secondary | ICD-10-CM

## 2014-02-27 DIAGNOSIS — I4891 Unspecified atrial fibrillation: Secondary | ICD-10-CM

## 2014-02-27 LAB — POCT INR: INR: 2

## 2014-03-04 NOTE — Progress Notes (Signed)
HPI: FU CAD and atrial fibrillation. Prior CABG with LIMA to LAD. Cath 2006 showed 100 LAD, 50 D1, 70 distal Lcx, 100 first OM, 75 prox RCA and 99 distal; LIMA patent; 60/70 distal LAD, normal EF. Had PCI of prox and distal RCA and first OM. Echo 7/11 showed EF 45-50 and biatrial enlargement. Nuclear study 10/14 showed inferior defect with no ischemia; not gated. Since last seen, he denies dyspnea, chest pain, palpitations or syncope.  Current Outpatient Prescriptions  Medication Sig Dispense Refill  . Coenzyme Q10 (COQ10 PO) Take 100 mg by mouth daily.    . folic acid (FOLVITE) 832 MCG tablet Take 400 mcg by mouth daily. 1/2 tab daily    . furosemide (LASIX) 40 MG tablet Take 1 tablet (40 mg total) by mouth daily as needed. Daily as needed for swelling 90 tablet 3  . glimepiride (AMARYL) 4 MG tablet Take by mouth daily before breakfast. Take 1/2 tablet daily  Before breakfast    . metFORMIN (GLUCOPHAGE) 1000 MG tablet Take 1,000 mg by mouth 2 (two) times daily.      . metoprolol (LOPRESSOR) 100 MG tablet TAKE ONE TABLET BY MOUTH TWICE DAILY 180 tablet 1  . Multiple Vitamin (MULTIVITAMIN) tablet Take 1 tablet by mouth daily.      . nitroGLYCERIN (NITROSTAT) 0.4 MG SL tablet Place 1 tablet (0.4 mg total) under the tongue every 5 (five) minutes as needed (up to 3 tablets). 25 tablet 6  . pravastatin (PRAVACHOL) 40 MG tablet Take 40 mg by mouth daily.    . quinapril (ACCUPRIL) 20 MG tablet Take 1 tablet (20 mg total) by mouth at bedtime. 30 tablet 6  . Thiamine HCl (VITAMIN B-1) 100 MG tablet Take 100 mg by mouth daily.      . vitamin B-12 (CYANOCOBALAMIN) 250 MCG tablet Take 250 mcg by mouth daily.      Marland Kitchen warfarin (COUMADIN) 5 MG tablet Take 1 tablet by mouth 3-4 times per week as directed by coumadin clinic 30 tablet 1  . warfarin (COUMADIN) 7.5 MG tablet Take 1 to 1.5 tablets by mouth daily as directed by coumadin clinic 120 tablet 1   No current facility-administered medications for  this visit.     Past Medical History  Diagnosis Date  . Delirium   . Hypernatremia   . Septic shock(785.52)   . Acute MI     subendocardial  . CAD (coronary artery disease)   . Chronic atrial fibrillation   . Drug therapy     coumadin  . Mixed hyperlipidemia   . HTN (hypertension)   . DM2 (diabetes mellitus, type 2)     Past Surgical History  Procedure Laterality Date  . Coronary artery bypass graft  1994    LIMA to LAD per [facility]    History   Social History  . Marital Status: Married    Spouse Name: N/A    Number of Children: N/A  . Years of Education: N/A   Occupational History  . Not on file.   Social History Main Topics  . Smoking status: Never Smoker   . Smokeless tobacco: Not on file  . Alcohol Use: No  . Drug Use: No  . Sexual Activity: Not Currently   Other Topics Concern  . Not on file   Social History Narrative   Married; retired; disabled, does not get regular exercise.     ROS: no fevers or chills, productive cough, hemoptysis, dysphasia, odynophagia, melena, hematochezia,  dysuria, hematuria, rash, seizure activity, orthopnea, PND, pedal edema, claudication. Remaining systems are negative.  Physical Exam: Well-developed well-nourished in no acute distress.  Skin is warm and dry.  HEENT is normal.  Neck is supple.  Chest is clear to auscultation with normal expansion.  Cardiovascular exam is irregular Abdominal exam nontender or distended. No masses palpated. Extremities show no edema. neuro grossly intact  ECG atrial fibrillation at a rate of 83. No ST changes.

## 2014-03-05 ENCOUNTER — Encounter: Payer: Self-pay | Admitting: Cardiology

## 2014-03-05 ENCOUNTER — Telehealth: Payer: Self-pay

## 2014-03-05 ENCOUNTER — Ambulatory Visit (INDEPENDENT_AMBULATORY_CARE_PROVIDER_SITE_OTHER): Payer: Medicare Other | Admitting: Cardiology

## 2014-03-05 VITALS — BP 122/80 | HR 83 | Ht 71.0 in | Wt 235.5 lb

## 2014-03-05 DIAGNOSIS — I2510 Atherosclerotic heart disease of native coronary artery without angina pectoris: Secondary | ICD-10-CM

## 2014-03-05 DIAGNOSIS — I10 Essential (primary) hypertension: Secondary | ICD-10-CM

## 2014-03-05 MED ORDER — QUINAPRIL HCL 20 MG PO TABS: 20.0000 mg | ORAL_TABLET | Freq: Every day | ORAL | Status: DC

## 2014-03-05 NOTE — Telephone Encounter (Signed)
-----   Message from Jordan Beets, RN sent at 03/05/2014 10:50 AM EST ----- Patient would like to move his protime checks out to every 8 weeks and dr Stanford Breed is agreeable with that. Thanks for all you do.

## 2014-03-05 NOTE — Assessment & Plan Note (Signed)
Continue statin. Lipids and liver monitored by primary care. 

## 2014-03-05 NOTE — Patient Instructions (Signed)
Your physician wants you to follow-up in: [name] will receive a reminder letter in the mail two months in advance. If you don't receive a letter, please call our office to schedule the follow-up appointment.   Jordan Clayton TO REPLACE WARFARIN

## 2014-03-05 NOTE — Assessment & Plan Note (Signed)
Continue statin. Not on aspirin given need for Coumadin. 

## 2014-03-05 NOTE — Assessment & Plan Note (Signed)
Patient remains in permanent atrial fibrillation. Continue beta blocker for rate control. Continue Coumadin. Hemoglobin monitored by primary care. I have provided the names of the NOACs and they will contact us if they would like to change.

## 2014-03-05 NOTE — Telephone Encounter (Signed)
Noted in next appt note.  Will schedule pt out 8 weeks if INR therapeutic.

## 2014-03-05 NOTE — Assessment & Plan Note (Signed)
Blood pressure controlled. Continue present medications. Potassium and renal function monitored by primary care. 

## 2014-04-10 ENCOUNTER — Ambulatory Visit (INDEPENDENT_AMBULATORY_CARE_PROVIDER_SITE_OTHER): Payer: Medicare Other | Admitting: *Deleted

## 2014-04-10 DIAGNOSIS — I4891 Unspecified atrial fibrillation: Secondary | ICD-10-CM

## 2014-04-10 DIAGNOSIS — Z7901 Long term (current) use of anticoagulants: Secondary | ICD-10-CM

## 2014-04-10 LAB — POCT INR: INR: 3

## 2014-05-02 ENCOUNTER — Other Ambulatory Visit: Payer: Self-pay

## 2014-05-02 MED ORDER — METOPROLOL TARTRATE 100 MG PO TABS: ORAL_TABLET | ORAL | Status: DC

## 2014-06-05 ENCOUNTER — Ambulatory Visit (INDEPENDENT_AMBULATORY_CARE_PROVIDER_SITE_OTHER): Payer: Medicare Other | Admitting: *Deleted

## 2014-06-05 DIAGNOSIS — I4891 Unspecified atrial fibrillation: Secondary | ICD-10-CM

## 2014-06-05 DIAGNOSIS — Z7901 Long term (current) use of anticoagulants: Secondary | ICD-10-CM

## 2014-06-05 LAB — POCT INR: INR: 3

## 2014-07-31 ENCOUNTER — Ambulatory Visit (INDEPENDENT_AMBULATORY_CARE_PROVIDER_SITE_OTHER): Payer: Medicare Other | Admitting: *Deleted

## 2014-07-31 DIAGNOSIS — Z7901 Long term (current) use of anticoagulants: Secondary | ICD-10-CM

## 2014-07-31 DIAGNOSIS — I4891 Unspecified atrial fibrillation: Secondary | ICD-10-CM

## 2014-07-31 LAB — POCT INR: INR: 2.7

## 2014-08-15 ENCOUNTER — Other Ambulatory Visit: Payer: Self-pay | Admitting: Pharmacist Clinician (PhC)/ Clinical Pharmacy Specialist

## 2014-08-15 MED ORDER — WARFARIN SODIUM 7.5 MG PO TABS: ORAL_TABLET | ORAL | Status: DC

## 2014-08-19 ENCOUNTER — Other Ambulatory Visit: Payer: Self-pay | Admitting: Pharmacist

## 2014-08-19 MED ORDER — WARFARIN SODIUM 5 MG PO TABS: ORAL_TABLET | ORAL | Status: DC

## 2014-09-10 NOTE — Progress Notes (Signed)
HPI: FU CAD and atrial fibrillation. Prior CABG with LIMA to LAD. Cath 2006 showed 100 LAD, 50 D1, 70 distal Lcx, 100 first OM, 75 prox RCA and 99 distal; LIMA patent; 60/70 distal LAD, normal EF. Had PCI of prox and distal RCA and first OM. Echo 7/11 showed EF 45-50 and biatrial enlargement. Nuclear study 10/14 showed inferior defect with no ischemia; not gated. Since last seen, the patient has dyspnea with more extreme activities but not with routine activities. It is relieved with rest. It is not associated with chest pain. There is no orthopnea, PND or pedal edema. There is no syncope or palpitations. There is no exertional chest pain.   Current Outpatient Prescriptions  Medication Sig Dispense Refill  . Coenzyme Q10 (COQ10 PO) Take 100 mg by mouth daily.    . folic acid (FOLVITE) 546 MCG tablet Take 400 mcg by mouth daily. 1/2 tab daily    . furosemide (LASIX) 40 MG tablet Take 1 tablet (40 mg total) by mouth daily as needed. Daily as needed for swelling 90 tablet 3  . glimepiride (AMARYL) 4 MG tablet Take by mouth daily before breakfast. Take 1/2 tablet daily  Before breakfast    . metFORMIN (GLUCOPHAGE) 1000 MG tablet Take 1,000 mg by mouth 2 (two) times daily.      . metoprolol (LOPRESSOR) 100 MG tablet TAKE ONE TABLET BY MOUTH TWICE DAILY 180 tablet 1  . Multiple Vitamin (MULTIVITAMIN) tablet Take 1 tablet by mouth daily.      . nitroGLYCERIN (NITROSTAT) 0.4 MG SL tablet Place 1 tablet (0.4 mg total) under the tongue every 5 (five) minutes as needed (up to 3 tablets). 25 tablet 6  . pravastatin (PRAVACHOL) 40 MG tablet Take 40 mg by mouth daily.    . quinapril (ACCUPRIL) 20 MG tablet Take 1 tablet (20 mg total) by mouth at bedtime. 90 tablet 3  . Thiamine HCl (VITAMIN B-1) 100 MG tablet Take 100 mg by mouth daily.      . vitamin B-12 (CYANOCOBALAMIN) 250 MCG tablet Take 250 mcg by mouth daily.      Marland Kitchen warfarin (COUMADIN) 5 MG tablet Take 1 tablet by mouth 3-4 times per week as  directed by coumadin clinic 30 tablet 1  . warfarin (COUMADIN) 7.5 MG tablet Take 1 tablet by mouth daily as directed by coumadin clinic 90 tablet 1   No current facility-administered medications for this visit.     Past Medical History  Diagnosis Date  . Delirium   . Hypernatremia   . Septic shock(785.52)   . Acute MI     subendocardial  . CAD (coronary artery disease)   . Chronic atrial fibrillation   . Drug therapy     coumadin  . Mixed hyperlipidemia   . HTN (hypertension)   . DM2 (diabetes mellitus, type 2)     Past Surgical History  Procedure Laterality Date  . Coronary artery bypass graft  1994    LIMA to LAD per [facility]    Social History   Social History  . Marital Status: Married    Spouse Name: N/A  . Number of Children: N/A  . Years of Education: N/A   Occupational History  . Not on file.   Social History Main Topics  . Smoking status: Never Smoker   . Smokeless tobacco: Current User    Types: Chew  . Alcohol Use: No  . Drug Use: No  . Sexual Activity: Not Currently  Other Topics Concern  . Not on file   Social History Narrative   Married; retired; disabled, does not get regular exercise.     ROS: fatigue but no fevers or chills, productive cough, hemoptysis, dysphasia, odynophagia, melena, hematochezia, dysuria, hematuria, rash, seizure activity, orthopnea, PND, pedal edema, claudication. Remaining systems are negative.  Physical Exam: Well-developed well-nourished in no acute distress.  Skin is warm and dry.  HEENT is normal.  Neck is supple.  Chest is clear to auscultation with normal expansion.  Cardiovascular exam is irregular Abdominal exam nontender or distended. No masses palpated. Extremities show trace edema. neuro grossly intact  ECG atrial fibrillation at a rate of 81. Normal axis. Nonspecific ST changes.

## 2014-09-12 ENCOUNTER — Encounter: Payer: Self-pay | Admitting: Cardiology

## 2014-09-12 ENCOUNTER — Ambulatory Visit (INDEPENDENT_AMBULATORY_CARE_PROVIDER_SITE_OTHER): Payer: Medicare Other | Admitting: Cardiology

## 2014-09-12 VITALS — BP 140/70 | HR 81 | Ht 70.0 in | Wt 234.4 lb

## 2014-09-12 DIAGNOSIS — I10 Essential (primary) hypertension: Secondary | ICD-10-CM | POA: Diagnosis not present

## 2014-09-12 DIAGNOSIS — I482 Chronic atrial fibrillation, unspecified: Secondary | ICD-10-CM

## 2014-09-12 DIAGNOSIS — I2510 Atherosclerotic heart disease of native coronary artery without angina pectoris: Secondary | ICD-10-CM

## 2014-09-12 DIAGNOSIS — I2583 Coronary atherosclerosis due to lipid rich plaque: Secondary | ICD-10-CM

## 2014-09-12 NOTE — Assessment & Plan Note (Signed)
Continue Pravachol. He did not tolerate Lipitor or Crestor previously. Lipids and liver monitored by primary care.

## 2014-09-12 NOTE — Assessment & Plan Note (Signed)
Blood pressure controlled. Continue present medications. Potassium and renal function monitored by primary care. 

## 2014-09-12 NOTE — Assessment & Plan Note (Signed)
Continue statin. Not on aspirin given need for Coumadin. 

## 2014-09-12 NOTE — Assessment & Plan Note (Signed)
Continue beta blocker for rate control. Continue Coumadin. He is not interested in DOACs.

## 2014-09-12 NOTE — Patient Instructions (Signed)
Your physician wants you to follow-up in: 6 MONTHS WITH DR CRENSHAW You will receive a reminder letter in the mail two months in advance. If you don't receive a letter, please call our office to schedule the follow-up appointment.  

## 2014-09-25 ENCOUNTER — Ambulatory Visit (INDEPENDENT_AMBULATORY_CARE_PROVIDER_SITE_OTHER): Payer: Medicare Other | Admitting: *Deleted

## 2014-09-25 DIAGNOSIS — Z7901 Long term (current) use of anticoagulants: Secondary | ICD-10-CM | POA: Diagnosis not present

## 2014-09-25 DIAGNOSIS — I4891 Unspecified atrial fibrillation: Secondary | ICD-10-CM

## 2014-09-25 LAB — POCT INR: INR: 3.3

## 2014-11-06 ENCOUNTER — Ambulatory Visit (INDEPENDENT_AMBULATORY_CARE_PROVIDER_SITE_OTHER): Payer: Medicare Other | Admitting: *Deleted

## 2014-11-06 DIAGNOSIS — I4891 Unspecified atrial fibrillation: Secondary | ICD-10-CM | POA: Diagnosis not present

## 2014-11-06 DIAGNOSIS — I482 Chronic atrial fibrillation, unspecified: Secondary | ICD-10-CM

## 2014-11-06 DIAGNOSIS — Z7901 Long term (current) use of anticoagulants: Secondary | ICD-10-CM

## 2014-11-06 LAB — POCT INR: INR: 2.6

## 2014-12-06 ENCOUNTER — Other Ambulatory Visit: Payer: Self-pay | Admitting: Cardiology

## 2015-01-01 ENCOUNTER — Ambulatory Visit (INDEPENDENT_AMBULATORY_CARE_PROVIDER_SITE_OTHER): Payer: Medicare Other | Admitting: *Deleted

## 2015-01-01 DIAGNOSIS — I4891 Unspecified atrial fibrillation: Secondary | ICD-10-CM

## 2015-01-01 DIAGNOSIS — I482 Chronic atrial fibrillation, unspecified: Secondary | ICD-10-CM

## 2015-01-01 DIAGNOSIS — Z7901 Long term (current) use of anticoagulants: Secondary | ICD-10-CM

## 2015-01-01 LAB — POCT INR: INR: 3

## 2015-02-24 ENCOUNTER — Other Ambulatory Visit: Payer: Self-pay | Admitting: Cardiology

## 2015-02-26 ENCOUNTER — Ambulatory Visit (INDEPENDENT_AMBULATORY_CARE_PROVIDER_SITE_OTHER): Payer: Medicare Other | Admitting: *Deleted

## 2015-02-26 DIAGNOSIS — Z7901 Long term (current) use of anticoagulants: Secondary | ICD-10-CM | POA: Diagnosis not present

## 2015-02-26 DIAGNOSIS — I4891 Unspecified atrial fibrillation: Secondary | ICD-10-CM | POA: Diagnosis not present

## 2015-02-26 DIAGNOSIS — I482 Chronic atrial fibrillation, unspecified: Secondary | ICD-10-CM

## 2015-02-26 LAB — POCT INR: INR: 3.7

## 2015-03-03 ENCOUNTER — Other Ambulatory Visit: Payer: Self-pay | Admitting: Cardiology

## 2015-03-03 NOTE — Telephone Encounter (Signed)
Rx request sent to pharmacy.  

## 2015-03-12 ENCOUNTER — Ambulatory Visit (INDEPENDENT_AMBULATORY_CARE_PROVIDER_SITE_OTHER): Payer: Medicare Other | Admitting: *Deleted

## 2015-03-12 DIAGNOSIS — Z7901 Long term (current) use of anticoagulants: Secondary | ICD-10-CM | POA: Diagnosis not present

## 2015-03-12 DIAGNOSIS — I482 Chronic atrial fibrillation, unspecified: Secondary | ICD-10-CM

## 2015-03-12 DIAGNOSIS — I4891 Unspecified atrial fibrillation: Secondary | ICD-10-CM | POA: Diagnosis not present

## 2015-03-12 LAB — POCT INR: INR: 3.2

## 2015-03-19 NOTE — Progress Notes (Signed)
HPI: FU CAD and atrial fibrillation. Prior CABG with LIMA to LAD. Cath 2006 showed 100 LAD, 50 D1, 70 distal Lcx, 100 first OM, 75 prox RCA and 99 distal; LIMA patent; 60/70 distal LAD, normal EF. Had PCI of prox and distal RCA and first OM. Echo 7/11 showed EF 45-50 and biatrial enlargement. Nuclear study 10/14 showed inferior defect with no ischemia; not gated. Since last seen, the patient denies any dyspnea on exertion, orthopnea, PND, pedal edema, palpitations, syncope or chest pain.   Current Outpatient Prescriptions  Medication Sig Dispense Refill  . Coenzyme Q10 (COQ10 PO) Take 100 mg by mouth daily.    . folic acid (FOLVITE) Q000111Q MCG tablet Take 400 mcg by mouth daily. 1/2 tab daily    . furosemide (LASIX) 40 MG tablet Take 1 tablet (40 mg total) by mouth daily as needed. Daily as needed for swelling 90 tablet 3  . glimepiride (AMARYL) 4 MG tablet Take by mouth daily before breakfast. Take 1/2 tablet daily  Before breakfast    . metFORMIN (GLUCOPHAGE) 1000 MG tablet Take 1,000 mg by mouth 2 (two) times daily.      . metoprolol (LOPRESSOR) 100 MG tablet TAKE ONE TABLET BY MOUTH TWICE DAILY 180 tablet 2  . Multiple Vitamin (MULTIVITAMIN) tablet Take 1 tablet by mouth daily.      . nitroGLYCERIN (NITROSTAT) 0.4 MG SL tablet Place 1 tablet (0.4 mg total) under the tongue every 5 (five) minutes as needed (up to 3 tablets). 25 tablet 6  . pravastatin (PRAVACHOL) 40 MG tablet Take 40 mg by mouth daily.    . quinapril (ACCUPRIL) 20 MG tablet TAKE ONE TABLET BY MOUTH AT BEDTIME 90 tablet 0  . Thiamine HCl (VITAMIN B-1) 100 MG tablet Take 100 mg by mouth daily.      . vitamin B-12 (CYANOCOBALAMIN) 250 MCG tablet Take 250 mcg by mouth daily.      Marland Kitchen warfarin (COUMADIN) 5 MG tablet TAKE ONE TABLET BY MOUTH 3-4 TIMES PER WEEK AS DIRECTED 30 tablet 1  . warfarin (COUMADIN) 7.5 MG tablet Take 1 tablet by mouth daily as directed by coumadin clinic 90 tablet 1   No current facility-administered  medications for this visit.     Past Medical History  Diagnosis Date  . Delirium   . Hypernatremia   . Septic shock(785.52)   . Acute MI ([place])     subendocardial  . CAD (coronary artery disease)   . Chronic atrial fibrillation ([place])   . Drug therapy     coumadin  . Mixed hyperlipidemia   . HTN (hypertension)   . DM2 (diabetes mellitus, type 2) ([place])     Past Surgical History  Procedure Laterality Date  . Coronary artery bypass graft  1994    LIMA to LAD per [facility]    Social History   Social History  . Marital Status: Married    Spouse Name: N/A  . Number of Children: N/A  . Years of Education: N/A   Occupational History  . Not on file.   Social History Main Topics  . Smoking status: Never Smoker   . Smokeless tobacco: Current User    Types: Chew  . Alcohol Use: No  . Drug Use: No  . Sexual Activity: Not Currently   Other Topics Concern  . Not on file   Social History Narrative   Married; retired; disabled, does not get regular exercise.     Family History  Problem  Relation Age of Onset  . Coronary artery disease      family hx  . Hypertension      family hx    ROS: no fevers or chills, productive cough, hemoptysis, dysphasia, odynophagia, melena, hematochezia, dysuria, hematuria, rash, seizure activity, orthopnea, PND, pedal edema, claudication. Remaining systems are negative.  Physical Exam: Well-developed well-nourished in no acute distress.  Skin is warm and dry.  HEENT is normal.  Neck is supple.  Chest is clear to auscultation with normal expansion.  Cardiovascular exam is irregular Abdominal exam nontender or distended. No masses palpated. Extremities show no edema. neuro grossly intact  ECG Atrial fibrillation at a rate of 59. Normal axis. No ST changes.

## 2015-03-21 ENCOUNTER — Ambulatory Visit (INDEPENDENT_AMBULATORY_CARE_PROVIDER_SITE_OTHER): Payer: Medicare Other | Admitting: Cardiology

## 2015-03-21 ENCOUNTER — Encounter: Payer: Self-pay | Admitting: Cardiology

## 2015-03-21 VITALS — BP 136/78 | HR 59 | Ht 71.0 in | Wt 223.0 lb

## 2015-03-21 DIAGNOSIS — I482 Chronic atrial fibrillation: Secondary | ICD-10-CM | POA: Diagnosis not present

## 2015-03-21 DIAGNOSIS — I10 Essential (primary) hypertension: Secondary | ICD-10-CM | POA: Diagnosis not present

## 2015-03-21 DIAGNOSIS — I25810 Atherosclerosis of coronary artery bypass graft(s) without angina pectoris: Secondary | ICD-10-CM

## 2015-03-21 DIAGNOSIS — I4821 Permanent atrial fibrillation: Secondary | ICD-10-CM

## 2015-03-21 NOTE — Assessment & Plan Note (Signed)
Continue beta blocker for rate control. Continue Coumadin. We discussed DOACs and he will consider.

## 2015-03-21 NOTE — Assessment & Plan Note (Signed)
Continue statin. Not on aspirin given need for Coumadin. 

## 2015-03-21 NOTE — Assessment & Plan Note (Signed)
Continue statin. He has not tolerated Crestor or Lipitor previously. Lipids and liver monitored by primary care.

## 2015-03-21 NOTE — Assessment & Plan Note (Signed)
Blood pressure controlled. Potassium and renal function monitored by primary care.

## 2015-03-21 NOTE — Patient Instructions (Signed)
Your physician wants you to follow-up in: ONE YEAR WITH DR CRENSHAW You will receive a reminder letter in the mail two months in advance. If you don't receive a letter, please call our office to schedule the follow-up appointment.   If you need a refill on your cardiac medications before your next appointment, please call your pharmacy.  

## 2015-03-26 ENCOUNTER — Ambulatory Visit (INDEPENDENT_AMBULATORY_CARE_PROVIDER_SITE_OTHER): Payer: Medicare Other | Admitting: *Deleted

## 2015-03-26 DIAGNOSIS — I4821 Permanent atrial fibrillation: Secondary | ICD-10-CM

## 2015-03-26 DIAGNOSIS — I4891 Unspecified atrial fibrillation: Secondary | ICD-10-CM | POA: Diagnosis not present

## 2015-03-26 DIAGNOSIS — Z7901 Long term (current) use of anticoagulants: Secondary | ICD-10-CM | POA: Diagnosis not present

## 2015-03-26 DIAGNOSIS — I482 Chronic atrial fibrillation: Secondary | ICD-10-CM | POA: Diagnosis not present

## 2015-03-26 LAB — POCT INR: INR: 2.8

## 2015-04-16 ENCOUNTER — Ambulatory Visit (INDEPENDENT_AMBULATORY_CARE_PROVIDER_SITE_OTHER): Payer: Medicare Other | Admitting: *Deleted

## 2015-04-16 DIAGNOSIS — I4821 Permanent atrial fibrillation: Secondary | ICD-10-CM

## 2015-04-16 DIAGNOSIS — I4891 Unspecified atrial fibrillation: Secondary | ICD-10-CM | POA: Diagnosis not present

## 2015-04-16 DIAGNOSIS — Z7901 Long term (current) use of anticoagulants: Secondary | ICD-10-CM

## 2015-04-16 DIAGNOSIS — I482 Chronic atrial fibrillation: Secondary | ICD-10-CM | POA: Diagnosis not present

## 2015-04-16 LAB — POCT INR: INR: 2.9

## 2015-04-28 ENCOUNTER — Other Ambulatory Visit: Payer: Self-pay | Admitting: Cardiology

## 2015-05-21 ENCOUNTER — Ambulatory Visit (INDEPENDENT_AMBULATORY_CARE_PROVIDER_SITE_OTHER): Payer: Medicare Other | Admitting: *Deleted

## 2015-05-21 DIAGNOSIS — I482 Chronic atrial fibrillation: Secondary | ICD-10-CM | POA: Diagnosis not present

## 2015-05-21 DIAGNOSIS — I4891 Unspecified atrial fibrillation: Secondary | ICD-10-CM | POA: Diagnosis not present

## 2015-05-21 DIAGNOSIS — I4821 Permanent atrial fibrillation: Secondary | ICD-10-CM

## 2015-05-21 DIAGNOSIS — Z7901 Long term (current) use of anticoagulants: Secondary | ICD-10-CM

## 2015-05-21 LAB — POCT INR: INR: 2.9

## 2015-06-06 ENCOUNTER — Other Ambulatory Visit: Payer: Self-pay | Admitting: Cardiology

## 2015-07-16 ENCOUNTER — Ambulatory Visit (INDEPENDENT_AMBULATORY_CARE_PROVIDER_SITE_OTHER): Payer: Medicare Other | Admitting: *Deleted

## 2015-07-16 DIAGNOSIS — I4821 Permanent atrial fibrillation: Secondary | ICD-10-CM

## 2015-07-16 DIAGNOSIS — I482 Chronic atrial fibrillation: Secondary | ICD-10-CM | POA: Diagnosis not present

## 2015-07-16 DIAGNOSIS — I4891 Unspecified atrial fibrillation: Secondary | ICD-10-CM

## 2015-07-16 DIAGNOSIS — Z7901 Long term (current) use of anticoagulants: Secondary | ICD-10-CM

## 2015-07-16 LAB — POCT INR: INR: 3.4

## 2015-08-04 ENCOUNTER — Ambulatory Visit (INDEPENDENT_AMBULATORY_CARE_PROVIDER_SITE_OTHER): Payer: Medicare Other | Admitting: Pharmacist

## 2015-08-04 DIAGNOSIS — Z7901 Long term (current) use of anticoagulants: Secondary | ICD-10-CM | POA: Diagnosis not present

## 2015-08-04 DIAGNOSIS — I482 Chronic atrial fibrillation: Secondary | ICD-10-CM

## 2015-08-04 DIAGNOSIS — I4891 Unspecified atrial fibrillation: Secondary | ICD-10-CM

## 2015-08-04 DIAGNOSIS — I4821 Permanent atrial fibrillation: Secondary | ICD-10-CM

## 2015-08-04 LAB — POCT INR: INR: 3.3

## 2015-08-25 ENCOUNTER — Ambulatory Visit (INDEPENDENT_AMBULATORY_CARE_PROVIDER_SITE_OTHER): Payer: Medicare Other | Admitting: Pharmacist

## 2015-08-25 DIAGNOSIS — I4891 Unspecified atrial fibrillation: Secondary | ICD-10-CM

## 2015-08-25 DIAGNOSIS — Z7901 Long term (current) use of anticoagulants: Secondary | ICD-10-CM

## 2015-08-25 LAB — POCT INR: INR: 2.4

## 2015-09-05 ENCOUNTER — Encounter: Payer: Self-pay | Admitting: Cardiology

## 2015-09-07 ENCOUNTER — Other Ambulatory Visit: Payer: Self-pay | Admitting: Cardiology

## 2015-09-22 ENCOUNTER — Ambulatory Visit (INDEPENDENT_AMBULATORY_CARE_PROVIDER_SITE_OTHER): Payer: Medicare Other | Admitting: *Deleted

## 2015-09-22 DIAGNOSIS — I4891 Unspecified atrial fibrillation: Secondary | ICD-10-CM

## 2015-09-22 DIAGNOSIS — Z7901 Long term (current) use of anticoagulants: Secondary | ICD-10-CM

## 2015-09-22 LAB — POCT INR: INR: 3.1

## 2015-09-25 ENCOUNTER — Ambulatory Visit (INDEPENDENT_AMBULATORY_CARE_PROVIDER_SITE_OTHER): Payer: Medicare Other | Admitting: Cardiology

## 2015-09-25 ENCOUNTER — Encounter: Payer: Self-pay | Admitting: Cardiology

## 2015-09-25 ENCOUNTER — Telehealth: Payer: Self-pay | Admitting: *Deleted

## 2015-09-25 VITALS — BP 130/76 | HR 76 | Ht 71.0 in | Wt 221.0 lb

## 2015-09-25 DIAGNOSIS — E785 Hyperlipidemia, unspecified: Secondary | ICD-10-CM

## 2015-09-25 DIAGNOSIS — I2510 Atherosclerotic heart disease of native coronary artery without angina pectoris: Secondary | ICD-10-CM | POA: Diagnosis not present

## 2015-09-25 DIAGNOSIS — I482 Chronic atrial fibrillation, unspecified: Secondary | ICD-10-CM

## 2015-09-25 DIAGNOSIS — E870 Hyperosmolality and hypernatremia: Secondary | ICD-10-CM

## 2015-09-25 DIAGNOSIS — I2583 Coronary atherosclerosis due to lipid rich plaque: Secondary | ICD-10-CM

## 2015-09-25 DIAGNOSIS — Z79899 Other long term (current) drug therapy: Secondary | ICD-10-CM

## 2015-09-25 DIAGNOSIS — I10 Essential (primary) hypertension: Secondary | ICD-10-CM

## 2015-09-25 NOTE — Progress Notes (Signed)
HPI: FU CAD and atrial fibrillation. Prior CABG with LIMA to LAD. Cath 2006 showed 100 LAD, 50 D1, 70 distal Lcx, 100 first OM, 75 prox RCA and 99 distal; LIMA patent; 60/70 distal LAD, normal EF. Had PCI of prox and distal RCA and first OM. Echo 7/11 showed EF 45-50 and biatrial enlargement. Nuclear study 10/14 showed inferior defect with no ischemia; not gated. Since last seen, the patient has dyspnea with more extreme activities but not with routine activities. It is relieved with rest. It is not associated with chest pain. There is no orthopnea, PND or pedal edema. There is no syncope or palpitations. There is no exertional chest pain.   Current Outpatient Prescriptions  Medication Sig Dispense Refill  . Coenzyme Q10 (COQ10 PO) Take 100 mg by mouth daily.    . folic acid (FOLVITE) Q000111Q MCG tablet Take 400 mcg by mouth daily. 1/2 tab daily    . furosemide (LASIX) 40 MG tablet Take 1 tablet (40 mg total) by mouth daily as needed. Daily as needed for swelling 90 tablet 3  . glimepiride (AMARYL) 4 MG tablet Take by mouth daily before breakfast. Take 1/2 tablet daily  Before breakfast    . metFORMIN (GLUCOPHAGE) 1000 MG tablet Take 1,000 mg by mouth 2 (two) times daily.      . metoprolol (LOPRESSOR) 100 MG tablet TAKE ONE TABLET BY MOUTH TWICE DAILY 180 tablet 2  . Multiple Vitamin (MULTIVITAMIN) tablet Take 1 tablet by mouth daily.      . nitroGLYCERIN (NITROSTAT) 0.4 MG SL tablet Place 1 tablet (0.4 mg total) under the tongue every 5 (five) minutes as needed (up to 3 tablets). 25 tablet 6  . pravastatin (PRAVACHOL) 40 MG tablet Take 40 mg by mouth daily.    . quinapril (ACCUPRIL) 20 MG tablet TAKE ONE TABLET BY MOUTH AT BEDTIME 90 tablet 2  . Thiamine HCl (VITAMIN B-1) 100 MG tablet Take 100 mg by mouth daily.      . vitamin B-12 (CYANOCOBALAMIN) 250 MCG tablet Take 250 mcg by mouth daily.      Marland Kitchen warfarin (COUMADIN) 5 MG tablet TAKE ONE TABLET BY MOUTH 3-4 TIMES PER WEEK AS DIRECTED 30  tablet 1  . warfarin (COUMADIN) 7.5 MG tablet TAKE ONE TABLET BY MOUTH ONCE DAILY AS DIRECTED BY COUMADIN CLINIC 90 tablet 0   No current facility-administered medications for this visit.      Past Medical History:  Diagnosis Date  . Acute MI ([place])    subendocardial  . CAD (coronary artery disease)   . Chronic atrial fibrillation ([place])   . Delirium   . DM2 (diabetes mellitus, type 2) ([place])   . Drug therapy    coumadin  . HTN (hypertension)   . Hypernatremia   . Mixed hyperlipidemia   . Septic shock(785.52)     Past Surgical History:  Procedure Laterality Date  . CORONARY ARTERY BYPASS GRAFT  1994   LIMA to LAD per [facility]    Social History   Social History  . Marital status: Married    Spouse name: N/A  . Number of children: N/A  . Years of education: N/A   Occupational History  . Not on file.   Social History Main Topics  . Smoking status: Never Smoker  . Smokeless tobacco: Current User    Types: Chew  . Alcohol use No  . Drug use: No  . Sexual activity: Not Currently   Other Topics Concern  .  Not on file   Social History Narrative   Married; retired; disabled, does not get regular exercise.     Family History  Problem Relation Age of Onset  . Coronary artery disease      family hx  . Hypertension      family hx    ROS: no fevers or chills, productive cough, hemoptysis, dysphasia, odynophagia, melena, hematochezia, dysuria, hematuria, rash, seizure activity, orthopnea, PND, pedal edema, claudication. Remaining systems are negative.  Physical Exam: Well-developed well-nourished in no acute distress.  Skin is warm and dry.  HEENT is normal.  Neck is supple.  Chest is clear to auscultation with normal expansion.  Cardiovascular exam is Irregular, 1/6 systolic murmur left sternal border. Abdominal exam nontender or distended. No masses palpated. Extremities show no edema. neuro grossly intact  ECG  A/P  1 Atrial fibrillation-continue beta  blocker for rate control. Continue Coumadin. We will obtain most recent laboratories including hemoglobin from his primary care physician's office.  2 coronary artery disease-continue statin. Not on aspirin given need for Coumadin.  3 hypertension-blood pressure controlled. Continue present medications.  4 hyperlipidemia-continue statin. He did not tolerate Crestor or Lipitor previously. Lipids and liver monitor by primary care.  Kirk Ruths, MD

## 2015-09-25 NOTE — Patient Instructions (Signed)
Medication Instructions:   NO CHANGES.  Labwork: Labwork will be requested from your primary care physician.  Follow-Up: Your physician wants you to follow-up in: [place]. You will receive a reminder letter in the mail two months in advance. If you don't receive a letter, please call our office to schedule the follow-up appointment.   If you need a refill on your cardiac medications before your next appointment, please call your pharmacy.

## 2015-09-25 NOTE — Telephone Encounter (Signed)
Received requested lab work from PCP.  Dr Stanford Breed reviewed lab work. He order for pt to have a CBC and BMET. Patient had already left the office. Called and left message with patient with instructions to have lab work. Will mail lab slips to patient and attempt to call him back tomorrow.

## 2015-09-26 LAB — BASIC METABOLIC PANEL
BUN: 23 mg/dL (ref 7–25)
CO2: 30 mmol/L (ref 20–31)
Calcium: 9 mg/dL (ref 8.6–10.3)
Chloride: 105 mmol/L (ref 98–110)
Creat: 1.12 mg/dL (ref 0.70–1.18)
GLUCOSE: 118 mg/dL — AB (ref 65–99)
POTASSIUM: 4.7 mmol/L (ref 3.5–5.3)
SODIUM: 141 mmol/L (ref 135–146)

## 2015-09-26 LAB — CBC WITH DIFFERENTIAL/PLATELET
BASOS ABS: 0 {cells}/uL (ref 0–200)
Basophils Relative: 0 %
EOS PCT: 3 %
Eosinophils Absolute: 231 cells/uL (ref 15–500)
HEMATOCRIT: 39.2 % (ref 38.5–50.0)
HEMOGLOBIN: 12.6 g/dL — AB (ref 13.2–17.1)
LYMPHS ABS: 1540 {cells}/uL (ref 850–3900)
Lymphocytes Relative: 20 %
MCH: 31.5 pg (ref 27.0–33.0)
MCHC: 32.1 g/dL (ref 32.0–36.0)
MCV: 98 fL (ref 80.0–100.0)
MONO ABS: 847 {cells}/uL (ref 200–950)
MPV: 12.3 fL (ref 7.5–12.5)
Monocytes Relative: 11 %
NEUTROS ABS: 5082 {cells}/uL (ref 1500–7800)
NEUTROS PCT: 66 %
Platelets: 209 10*3/uL (ref 140–400)
RBC: 4 MIL/uL — ABNORMAL LOW (ref 4.20–5.80)
RDW: 14.3 % (ref 11.0–15.0)
WBC: 7.7 10*3/uL (ref 3.8–10.8)

## 2015-09-26 MED ORDER — NITROGLYCERIN 0.4 MG SL SUBL: 0.4000 mg | SUBLINGUAL_TABLET | SUBLINGUAL | 6 refills | Status: DC | PRN

## 2015-09-26 NOTE — Telephone Encounter (Signed)
Routed to Malcolm (f/u call)

## 2015-09-26 NOTE — Telephone Encounter (Signed)
Follow up        Returning a call to the nurse regarding lab work

## 2015-09-26 NOTE — Telephone Encounter (Signed)
Returned call to patient's wife, ok per DPR.  Gave instructions for lab work and where to go.  She verbalized understanding. She then asked for refill for Nitroglycerin which they asked Dr Stanford Breed about yesterday. Rx sent for Nitro and labs in system.  Patient will go today or first of next week to get labwork downstairs.

## 2015-09-30 ENCOUNTER — Telehealth: Payer: Self-pay | Admitting: Cardiology

## 2015-09-30 ENCOUNTER — Ambulatory Visit: Payer: Medicare Other | Admitting: Cardiology

## 2015-09-30 NOTE — Telephone Encounter (Signed)
Done 9/5

## 2015-09-30 NOTE — Telephone Encounter (Signed)
New message ° ° ° ° °Returning a call to the nurse °

## 2015-10-07 ENCOUNTER — Other Ambulatory Visit: Payer: Self-pay | Admitting: Cardiology

## 2015-10-20 ENCOUNTER — Ambulatory Visit (INDEPENDENT_AMBULATORY_CARE_PROVIDER_SITE_OTHER): Payer: Medicare Other | Admitting: Pharmacist

## 2015-10-20 DIAGNOSIS — Z7901 Long term (current) use of anticoagulants: Secondary | ICD-10-CM

## 2015-10-20 DIAGNOSIS — I4891 Unspecified atrial fibrillation: Secondary | ICD-10-CM

## 2015-10-20 LAB — POCT INR: INR: 2.6

## 2015-11-24 ENCOUNTER — Ambulatory Visit (INDEPENDENT_AMBULATORY_CARE_PROVIDER_SITE_OTHER): Payer: Medicare Other

## 2015-11-24 DIAGNOSIS — Z7901 Long term (current) use of anticoagulants: Secondary | ICD-10-CM

## 2015-11-24 DIAGNOSIS — I4891 Unspecified atrial fibrillation: Secondary | ICD-10-CM | POA: Diagnosis not present

## 2015-11-24 LAB — POCT INR: INR: 2.5

## 2015-12-11 ENCOUNTER — Other Ambulatory Visit: Payer: Self-pay | Admitting: Cardiology

## 2015-12-12 NOTE — Telephone Encounter (Signed)
Rx(s) sent to pharmacy electronically.  

## 2016-01-05 ENCOUNTER — Ambulatory Visit (INDEPENDENT_AMBULATORY_CARE_PROVIDER_SITE_OTHER): Payer: Medicare Other | Admitting: Pharmacist

## 2016-01-05 DIAGNOSIS — Z7901 Long term (current) use of anticoagulants: Secondary | ICD-10-CM

## 2016-01-05 DIAGNOSIS — I4891 Unspecified atrial fibrillation: Secondary | ICD-10-CM | POA: Diagnosis not present

## 2016-01-05 LAB — POCT INR: INR: 1.6

## 2016-01-08 ENCOUNTER — Other Ambulatory Visit: Payer: Self-pay | Admitting: Cardiology

## 2016-01-22 ENCOUNTER — Ambulatory Visit (INDEPENDENT_AMBULATORY_CARE_PROVIDER_SITE_OTHER): Payer: Medicare Other | Admitting: *Deleted

## 2016-01-22 DIAGNOSIS — I4891 Unspecified atrial fibrillation: Secondary | ICD-10-CM

## 2016-01-22 DIAGNOSIS — Z7901 Long term (current) use of anticoagulants: Secondary | ICD-10-CM | POA: Diagnosis not present

## 2016-01-22 LAB — POCT INR: INR: 2.8

## 2016-02-19 ENCOUNTER — Ambulatory Visit (INDEPENDENT_AMBULATORY_CARE_PROVIDER_SITE_OTHER): Payer: Medicare Other | Admitting: *Deleted

## 2016-02-19 DIAGNOSIS — Z7901 Long term (current) use of anticoagulants: Secondary | ICD-10-CM | POA: Diagnosis not present

## 2016-02-19 DIAGNOSIS — I4891 Unspecified atrial fibrillation: Secondary | ICD-10-CM | POA: Diagnosis not present

## 2016-02-19 LAB — POCT INR: INR: 2.6

## 2016-03-02 ENCOUNTER — Other Ambulatory Visit: Payer: Self-pay | Admitting: Cardiology

## 2016-03-03 NOTE — Telephone Encounter (Signed)
Rx(s) sent to pharmacy electronically.  

## 2016-04-01 ENCOUNTER — Ambulatory Visit (INDEPENDENT_AMBULATORY_CARE_PROVIDER_SITE_OTHER): Payer: Medicare Other | Admitting: *Deleted

## 2016-04-01 DIAGNOSIS — Z7901 Long term (current) use of anticoagulants: Secondary | ICD-10-CM

## 2016-04-01 DIAGNOSIS — I4891 Unspecified atrial fibrillation: Secondary | ICD-10-CM

## 2016-04-01 LAB — POCT INR: INR: 2.7

## 2016-05-11 ENCOUNTER — Other Ambulatory Visit: Payer: Self-pay | Admitting: Cardiology

## 2016-05-27 ENCOUNTER — Ambulatory Visit (INDEPENDENT_AMBULATORY_CARE_PROVIDER_SITE_OTHER): Payer: Medicare Other | Admitting: *Deleted

## 2016-05-27 DIAGNOSIS — I4891 Unspecified atrial fibrillation: Secondary | ICD-10-CM | POA: Diagnosis not present

## 2016-05-27 DIAGNOSIS — Z7901 Long term (current) use of anticoagulants: Secondary | ICD-10-CM | POA: Diagnosis not present

## 2016-05-27 LAB — POCT INR: INR: 3.5

## 2016-05-31 ENCOUNTER — Other Ambulatory Visit: Payer: Self-pay | Admitting: Cardiology

## 2016-06-10 ENCOUNTER — Ambulatory Visit (INDEPENDENT_AMBULATORY_CARE_PROVIDER_SITE_OTHER): Payer: Medicare Other | Admitting: *Deleted

## 2016-06-10 DIAGNOSIS — I4891 Unspecified atrial fibrillation: Secondary | ICD-10-CM | POA: Diagnosis not present

## 2016-06-10 DIAGNOSIS — Z7901 Long term (current) use of anticoagulants: Secondary | ICD-10-CM

## 2016-06-10 LAB — POCT INR: INR: 1.1

## 2016-06-18 ENCOUNTER — Ambulatory Visit (INDEPENDENT_AMBULATORY_CARE_PROVIDER_SITE_OTHER): Payer: Medicare Other | Admitting: *Deleted

## 2016-06-18 DIAGNOSIS — Z7901 Long term (current) use of anticoagulants: Secondary | ICD-10-CM | POA: Diagnosis not present

## 2016-06-18 DIAGNOSIS — I4891 Unspecified atrial fibrillation: Secondary | ICD-10-CM | POA: Diagnosis not present

## 2016-06-18 LAB — POCT INR: INR: 2.1

## 2016-07-02 ENCOUNTER — Ambulatory Visit (INDEPENDENT_AMBULATORY_CARE_PROVIDER_SITE_OTHER): Payer: Medicare Other | Admitting: Pharmacist

## 2016-07-02 DIAGNOSIS — Z7901 Long term (current) use of anticoagulants: Secondary | ICD-10-CM | POA: Diagnosis not present

## 2016-07-02 DIAGNOSIS — I4891 Unspecified atrial fibrillation: Secondary | ICD-10-CM | POA: Diagnosis not present

## 2016-07-02 LAB — POCT INR: INR: 3.2

## 2016-07-21 ENCOUNTER — Encounter (HOSPITAL_COMMUNITY): Payer: Self-pay | Admitting: Emergency Medicine

## 2016-07-21 ENCOUNTER — Emergency Department (HOSPITAL_COMMUNITY): Payer: Medicare Other

## 2016-07-21 ENCOUNTER — Observation Stay (HOSPITAL_COMMUNITY)
Admission: EM | Admit: 2016-07-21 | Discharge: 2016-07-23 | Disposition: A | Discharge status: Home / Self Care | Payer: Medicare Other | Attending: Family Medicine | Admitting: Family Medicine

## 2016-07-21 DIAGNOSIS — N390 Urinary tract infection, site not specified: Secondary | ICD-10-CM

## 2016-07-21 DIAGNOSIS — Z79899 Other long term (current) drug therapy: Secondary | ICD-10-CM | POA: Insufficient documentation

## 2016-07-21 DIAGNOSIS — E870 Hyperosmolality and hypernatremia: Secondary | ICD-10-CM | POA: Insufficient documentation

## 2016-07-21 DIAGNOSIS — I5033 Acute on chronic diastolic (congestive) heart failure: Secondary | ICD-10-CM | POA: Insufficient documentation

## 2016-07-21 DIAGNOSIS — R509 Fever, unspecified: Secondary | ICD-10-CM | POA: Diagnosis present

## 2016-07-21 DIAGNOSIS — I482 Chronic atrial fibrillation, unspecified: Secondary | ICD-10-CM | POA: Diagnosis present

## 2016-07-21 DIAGNOSIS — I4891 Unspecified atrial fibrillation: Secondary | ICD-10-CM | POA: Diagnosis present

## 2016-07-21 DIAGNOSIS — I110 Hypertensive heart disease with heart failure: Secondary | ICD-10-CM | POA: Diagnosis not present

## 2016-07-21 DIAGNOSIS — A419 Sepsis, unspecified organism: Secondary | ICD-10-CM | POA: Diagnosis not present

## 2016-07-21 DIAGNOSIS — E119 Type 2 diabetes mellitus without complications: Secondary | ICD-10-CM | POA: Diagnosis not present

## 2016-07-21 DIAGNOSIS — N12 Tubulo-interstitial nephritis, not specified as acute or chronic: Principal | ICD-10-CM | POA: Insufficient documentation

## 2016-07-21 DIAGNOSIS — Z7982 Long term (current) use of aspirin: Secondary | ICD-10-CM | POA: Insufficient documentation

## 2016-07-21 DIAGNOSIS — Z7901 Long term (current) use of anticoagulants: Secondary | ICD-10-CM | POA: Insufficient documentation

## 2016-07-21 DIAGNOSIS — Z7984 Long term (current) use of oral hypoglycemic drugs: Secondary | ICD-10-CM | POA: Insufficient documentation

## 2016-07-21 DIAGNOSIS — E782 Mixed hyperlipidemia: Secondary | ICD-10-CM | POA: Diagnosis not present

## 2016-07-21 DIAGNOSIS — I10 Essential (primary) hypertension: Secondary | ICD-10-CM | POA: Diagnosis present

## 2016-07-21 LAB — CBC WITH DIFFERENTIAL/PLATELET
BASOS PCT: 0 %
Basophils Absolute: 0 10*3/uL (ref 0.0–0.1)
Eosinophils Absolute: 0.1 10*3/uL (ref 0.0–0.7)
Eosinophils Relative: 1 %
HEMATOCRIT: 39.2 % (ref 39.0–52.0)
HEMOGLOBIN: 12.7 g/dL — AB (ref 13.0–17.0)
LYMPHS ABS: 0.7 10*3/uL (ref 0.7–4.0)
Lymphocytes Relative: 5 %
MCH: 33 pg (ref 26.0–34.0)
MCHC: 32.4 g/dL (ref 30.0–36.0)
MCV: 101.8 fL — ABNORMAL HIGH (ref 78.0–100.0)
MONOS PCT: 6 %
Monocytes Absolute: 0.9 10*3/uL (ref 0.1–1.0)
NEUTROS ABS: 12.5 10*3/uL — AB (ref 1.7–7.7)
NEUTROS PCT: 88 %
Platelets: 164 10*3/uL (ref 150–400)
RBC: 3.85 MIL/uL — AB (ref 4.22–5.81)
RDW: 14.7 % (ref 11.5–15.5)
WBC: 14.2 10*3/uL — ABNORMAL HIGH (ref 4.0–10.5)

## 2016-07-21 LAB — COMPREHENSIVE METABOLIC PANEL
ALBUMIN: 3.7 g/dL (ref 3.5–5.0)
ALT: 19 U/L (ref 17–63)
AST: 27 U/L (ref 15–41)
Alkaline Phosphatase: 36 U/L — ABNORMAL LOW (ref 38–126)
Anion gap: 10 (ref 5–15)
BUN: 25 mg/dL — AB (ref 6–20)
CHLORIDE: 108 mmol/L (ref 101–111)
CO2: 21 mmol/L — ABNORMAL LOW (ref 22–32)
Calcium: 9.4 mg/dL (ref 8.9–10.3)
Creatinine, Ser: 1.23 mg/dL (ref 0.61–1.24)
GFR calc Af Amer: >60 mL/min (ref >60)
GFR calc non Af Amer: 54 mL/min — ABNORMAL LOW (ref >60)
GLUCOSE: 171 mg/dL — AB (ref 65–99)
POTASSIUM: 4.2 mmol/L (ref 3.5–5.1)
SODIUM: 139 mmol/L (ref 135–145)
Total Bilirubin: 1.8 mg/dL — ABNORMAL HIGH (ref 0.3–1.2)
Total Protein: 6.4 g/dL — ABNORMAL LOW (ref 6.5–8.1)

## 2016-07-21 LAB — URINALYSIS, ROUTINE W REFLEX MICROSCOPIC
BILIRUBIN URINE: NEGATIVE
Glucose, UA: NEGATIVE mg/dL
KETONES UR: NEGATIVE mg/dL
NITRITE: POSITIVE — AB
PH: 5 (ref 5.0–8.0)
Protein, ur: 100 mg/dL — AB
Specific Gravity, Urine: 1.027 (ref 1.005–1.030)

## 2016-07-21 LAB — PROTIME-INR
INR: 3.56
Prothrombin Time: 36.4 seconds — ABNORMAL HIGH (ref 11.4–15.2)

## 2016-07-21 LAB — I-STAT CG4 LACTIC ACID, ED: Lactic Acid, Venous: 3.38 mmol/L (ref 0.5–1.9)

## 2016-07-21 MED ORDER — VITAMIN B-1 100 MG PO TABS
100.0000 mg | ORAL_TABLET | Freq: Every day | ORAL | Status: DC
  Administered 2016-07-22 – 2016-07-23 (×2): 100 mg via ORAL
  Filled 2016-07-21 (×2): qty 1

## 2016-07-21 MED ORDER — ACETAMINOPHEN 325 MG PO TABS
ORAL_TABLET | ORAL | Status: AC
  Filled 2016-07-21: qty 2

## 2016-07-21 MED ORDER — ADULT MULTIVITAMIN W/MINERALS CH
1.0000 | ORAL_TABLET | Freq: Every day | ORAL | Status: DC
  Administered 2016-07-22 – 2016-07-23 (×2): 1 via ORAL
  Filled 2016-07-21 (×2): qty 1

## 2016-07-21 MED ORDER — DEXTROSE 5 % IV SOLN
1.0000 g | INTRAVENOUS | Status: DC
  Administered 2016-07-22: 1 g via INTRAVENOUS
  Filled 2016-07-21 (×2): qty 10

## 2016-07-21 MED ORDER — INSULIN ASPART 100 UNIT/ML ~~LOC~~ SOLN
0.0000 [IU] | Freq: Three times a day (TID) | SUBCUTANEOUS | Status: DC
  Administered 2016-07-22 – 2016-07-23 (×4): 2 [IU] via SUBCUTANEOUS
  Administered 2016-07-23: 5 [IU] via SUBCUTANEOUS

## 2016-07-21 MED ORDER — PRAVASTATIN SODIUM 40 MG PO TABS
40.0000 mg | ORAL_TABLET | Freq: Every day | ORAL | Status: DC
  Administered 2016-07-21 – 2016-07-22 (×2): 40 mg via ORAL
  Filled 2016-07-21 (×2): qty 1

## 2016-07-21 MED ORDER — ACETAMINOPHEN 325 MG PO TABS: 650.0000 mg | ORAL_TABLET | Freq: Four times a day (QID) | ORAL | Status: DC | PRN

## 2016-07-21 MED ORDER — WARFARIN - PHARMACIST DOSING INPATIENT
Freq: Every day | Status: DC
  Administered 2016-07-23: 18:00:00

## 2016-07-21 MED ORDER — ONDANSETRON HCL 4 MG PO TABS: 4.0000 mg | ORAL_TABLET | Freq: Four times a day (QID) | ORAL | Status: DC | PRN

## 2016-07-21 MED ORDER — SODIUM CHLORIDE 0.9 % IV BOLUS (SEPSIS)
1000.0000 mL | Freq: Once | INTRAVENOUS | Status: AC
  Administered 2016-07-21: 1000 mL via INTRAVENOUS

## 2016-07-21 MED ORDER — ACETAMINOPHEN 500 MG PO TABS: 500.0000 mg | ORAL_TABLET | Freq: Four times a day (QID) | ORAL | Status: DC | PRN

## 2016-07-21 MED ORDER — SODIUM CHLORIDE 0.9 % IV SOLN
INTRAVENOUS | Status: DC
  Administered 2016-07-22 (×2): via INTRAVENOUS

## 2016-07-21 MED ORDER — ACETAMINOPHEN 325 MG PO TABS
650.0000 mg | ORAL_TABLET | Freq: Once | ORAL | Status: AC | PRN
  Administered 2016-07-21: 650 mg via ORAL

## 2016-07-21 MED ORDER — CEFTRIAXONE SODIUM 1 G IJ SOLR
1.0000 g | Freq: Once | INTRAMUSCULAR | Status: AC
  Administered 2016-07-21: 1 g via INTRAVENOUS
  Filled 2016-07-21: qty 10

## 2016-07-21 MED ORDER — ASPIRIN EC 81 MG PO TBEC
81.0000 mg | DELAYED_RELEASE_TABLET | ORAL | Status: DC
  Administered 2016-07-22 – 2016-07-23 (×2): 81 mg via ORAL
  Filled 2016-07-21 (×2): qty 1

## 2016-07-21 MED ORDER — CYANOCOBALAMIN 250 MCG PO TABS
250.0000 ug | ORAL_TABLET | Freq: Every day | ORAL | Status: DC
  Filled 2016-07-21: qty 1

## 2016-07-21 MED ORDER — FOLIC ACID 0.5 MG HALF TAB
500.0000 ug | ORAL_TABLET | Freq: Every day | ORAL | Status: DC
  Filled 2016-07-21: qty 1

## 2016-07-21 MED ORDER — METOPROLOL TARTRATE 100 MG PO TABS
100.0000 mg | ORAL_TABLET | Freq: Two times a day (BID) | ORAL | Status: DC
  Administered 2016-07-21 – 2016-07-23 (×4): 100 mg via ORAL
  Filled 2016-07-21 (×4): qty 1

## 2016-07-21 MED ORDER — ONDANSETRON HCL 4 MG/2ML IJ SOLN: 4.0000 mg | Freq: Four times a day (QID) | INTRAMUSCULAR | Status: DC | PRN

## 2016-07-21 NOTE — Progress Notes (Signed)
ANTICOAGULATION CONSULT NOTE - Initial Consult  Pharmacy Consult for warfarin Indication: atrial fibrillation  Allergies  Allergen Reactions  . Atorvastatin   . Oxycodone-Acetaminophen     REACTION: Hallucinations  . Quetiapine     REACTION: disorientated  . Rosuvastatin     REACTION: Reaction not known    Patient Measurements: Height: 5\' 11"  (180.3 cm) Weight: 216 lb (98 kg) IBW/kg (Calculated) : 75.3  Vital Signs: Temp: 100.5 F (38.1 C) (06/27 1915) Temp Source: Oral (06/27 1915) BP: 139/68 (06/27 1915) Pulse Rate: 84 (06/27 1915)  Labs:  Recent Labs  07/21/16 1939  HGB 12.7*  HCT 39.2  PLT 164  LABPROT 36.4*  INR 3.56  CREATININE 1.23    Estimated Creatinine Clearance: 59.1 mL/min (by C-G formula based on SCr of 1.23 mg/dL).   Assessment: 88 YOM on chronic warfarin for AFib. Home dose is 7.5mg  daily except 5mg  on Wednesdays and Saturdays per anticoagulation clinic note on 6/8. His INR on admission today is elevated at 3.56. No bleeding noted- CBC is essentially normal.  Goal of Therapy:  INR 2-3 Monitor platelets by anticoagulation protocol: Yes   Plan:  Hold warfarin tonight Daily INR Follow for s/s bleeding  Royden Bulman D. Tanis Burnley, PharmD, BCPS Clinical Pharmacist [phone] 07/21/2016 9:37 PM

## 2016-07-21 NOTE — ED Notes (Signed)
Date and time results received: 07/21/16 20:00   Test: Lactic Acid 3.38 Critical Value: 3.38  Name of Provider Notified: [place]  Orders Received? Or Actions Taken?:Will re-evaluate pt

## 2016-07-21 NOTE — ED Provider Notes (Signed)
[facility] DEPT Provider Note   CSN: [id number] Arrival date & time: 07/21/16  8756     History   Chief Complaint Chief Complaint  Patient presents with  . Fever  . Back Pain    HPI Jordan Clayton is a 76 y.o. male.  77 yo M with a chief complaints of increased urinary frequency and bilateral side pain. Having some fevers and chills as well. Started a couple days ago and worsening today. Patient would shake all over and then become really cold and then have a fever. About 103 at home.   The history is provided by the patient.  Fever   This is a new problem. The current episode started 2 days ago. The problem occurs constantly. The problem has been gradually worsening. Pertinent negatives include no chest pain, no diarrhea, no vomiting, no congestion and no headaches. He has tried nothing for the symptoms. The treatment provided no relief.  Back Pain   Associated symptoms include a fever. Pertinent negatives include no chest pain, no headaches and no abdominal pain.    Past Medical History:  Diagnosis Date  . Acute MI ([place])    subendocardial  . CAD (coronary artery disease)   . Chronic atrial fibrillation ([place])   . Delirium   . DM2 (diabetes mellitus, type 2) ([place])   . Drug therapy    coumadin  . HTN (hypertension)   . Hypernatremia   . Mixed hyperlipidemia   . Septic shock(785.52)     Patient Active Problem List   Diagnosis Date Noted  . Sepsis secondary to UTI ([place]) 07/21/2016  . Coronary artery disease 08/31/2011  . Long term (current) use of anticoagulants 12/16/2010  . EDEMA 02/09/2010  . HYPERNATREMIA 09/12/2009  . 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 DELIRIUM 09/12/2009  . SEPTIC SHOCK 09/12/2009  . DM2 (diabetes mellitus, type 2) ([place]) 03/21/2009  . HYPERLIPIDEMIA-MIXED 03/21/2009  . Essential hypertension 03/21/2009  . ATRIAL FIBRILLATION, CHRONIC 03/21/2009    Past Surgical History:  Procedure Laterality Date  . CORONARY ARTERY BYPASS  GRAFT  1994   LIMA to LAD per [facility]       Home Medications    Prior to Admission medications   Medication Sig Start Date End Date Taking? Authorizing Provider  acetaminophen (TYLENOL) 500 MG tablet Take 500 mg by mouth every 6 (six) hours as needed for fever.   Yes [provider]  aspirin EC 81 MG tablet Take 81 mg by mouth every morning.   Yes [provider]  Coenzyme Q10 (COQ10 PO) Take 100 mg by mouth daily.   Yes [provider]  folic acid (FOLVITE) 433 MCG tablet Take 400 mcg by mouth daily. 1/2 tab daily   Yes [provider]  LUTEIN PO Take 1 tablet by mouth daily.   Yes [provider]  MAGNESIUM CITRATE PO Take 400 mg by mouth daily.   Yes [provider]  metFORMIN (GLUCOPHAGE) 1000 MG tablet Take 1,000 mg by mouth 2 (two) times daily.     Yes [provider]  metoprolol (LOPRESSOR) 100 MG tablet Take 1 tablet (100 mg total) by mouth 2 (two) times daily. 12/12/15  Yes Lelon Perla, MD  Multiple Vitamin (MULTIVITAMIN) tablet Take 1 tablet by mouth daily.     Yes [provider]  pravastatin (PRAVACHOL) 40 MG tablet Take 40 mg by mouth at bedtime.    Yes [provider]  quinapril (ACCUPRIL) 20 MG tablet TAKE ONE TABLET BY  MOUTH ONCE DAILY AT BEDTIME 03/03/16  Yes Crenshaw, Denice Bors, MD  Thiamine HCl (VITAMIN B-1) 100 MG tablet Take 100 mg by mouth daily.     Yes [provider]  vitamin B-12 (CYANOCOBALAMIN) 250 MCG tablet Take 250 mcg by mouth daily.     Yes [provider]  warfarin (COUMADIN) 7.5 MG tablet TAKE ONE TABLET BY MOUTH ONCE DAILY AS DIRECTED BY COUMADIN CLINIC Patient taking differently: TAKE ONE TABLET BY MOUTH ONCE DAILY AS DIRECTED BY COUMADIN CLINIC ([place], [place], [place], [place], [place]) 05/31/16  Yes Crenshaw, Denice Bors, MD  furosemide (LASIX) 40 MG tablet Take 1 tablet (40 mg total) by mouth daily as needed. Daily as needed for swelling Patient taking  differently: Take 40 mg by mouth daily as needed for fluid or edema. Daily as needed for swelling 08/31/11   Wall, Marijo Conception, MD  nitroGLYCERIN (NITROSTAT) 0.4 MG SL tablet Place 1 tablet (0.4 mg total) under the tongue every 5 (five) minutes as needed (up to 3 tablets). Patient taking differently: Place 0.4 mg under the tongue every 5 (five) minutes as needed for chest pain (up to 3 tablets).  09/26/15   Lelon Perla, MD  warfarin (COUMADIN) 5 MG tablet Take 1 tablet as directed by coumadin clinic (also takes 7.5 mg tablets) Patient taking differently: Take 5 mg by mouth See admin instructions. Take 1 tablet as directed by coumadin clinic (also takes 7.5 mg tablets) Take 5 mg on Wednesday and Saturday 05/11/16   Lelon Perla, MD    Family History Family History  Problem Relation Age of Onset  . Coronary artery disease Unknown        family hx  . Hypertension Unknown        family hx    Social History Social History  Substance Use Topics  . Smoking status: Never Smoker  . Smokeless tobacco: Current User    Types: Chew  . Alcohol use No     Allergies   Atorvastatin; Oxycodone-acetaminophen; Quetiapine; and Rosuvastatin   Review of Systems Review of Systems  Constitutional: Positive for fever. Negative for chills.  HENT: Negative for congestion and facial swelling.   Eyes: Negative for discharge and visual disturbance.  Respiratory: Negative for shortness of breath.   Cardiovascular: Negative for chest pain and palpitations.  Gastrointestinal: Negative for abdominal pain, diarrhea and vomiting.  Musculoskeletal: Positive for back pain. Negative for arthralgias and myalgias.  Skin: Negative for color change and rash.  Neurological: Negative for tremors, syncope and headaches.  Psychiatric/Behavioral: Negative for confusion and dysphoric mood.     Physical Exam Updated Vital Signs BP 137/77   Pulse 79   Temp 98.1 F (36.7 C) (Oral)   Resp (!) 23   Ht 5\' 11"  (1.803  m)   Wt 98 kg (216 lb)   SpO2 94%   BMI 30.13 kg/m   Physical Exam  Constitutional: He is oriented to person, place, and time. He appears well-developed and well-nourished.  HENT:  Head: Normocephalic and atraumatic.  Eyes: EOM are normal. Pupils are equal, round, and reactive to light.  Neck: Normal range of motion. Neck supple. No JVD present.  Cardiovascular: Normal rate and regular rhythm.  Exam reveals no gallop and no friction rub.   No murmur heard. Pulmonary/Chest: No respiratory distress. He has no wheezes.  Abdominal: He exhibits no distension and no mass. There is no tenderness. There is no rebound and no guarding.  Mild cva tenderness  Musculoskeletal: Normal range of motion.  Neurological: He is alert and oriented to person, place, and time.  Skin: No rash noted. He is diaphoretic. No pallor.  Psychiatric: He has a normal mood and affect. His behavior is normal.  Nursing note and vitals reviewed.    ED Treatments / Results  Labs (all labs ordered are listed, but only abnormal results are displayed) Labs Reviewed  COMPREHENSIVE METABOLIC PANEL - Abnormal; Notable for the following:       Result Value   CO2 21 (*)    Glucose, Bld 171 (*)    BUN 25 (*)    Total Protein 6.4 (*)    Alkaline Phosphatase 36 (*)    Total Bilirubin 1.8 (*)    GFR calc non Af Amer 54 (*)    All other components within normal limits  CBC WITH DIFFERENTIAL/PLATELET - Abnormal; Notable for the following:    WBC 14.2 (*)    RBC 3.85 (*)    Hemoglobin 12.7 (*)    MCV 101.8 (*)    Neutro Abs 12.5 (*)    All other components within normal limits  PROTIME-INR - Abnormal; Notable for the following:    Prothrombin Time 36.4 (*)    All other components within normal limits  URINALYSIS, ROUTINE W REFLEX MICROSCOPIC - Abnormal; Notable for the following:    APPearance HAZY (*)    Hgb urine dipstick MODERATE (*)    Protein, ur 100 (*)    Nitrite POSITIVE (*)    Leukocytes, UA LARGE (*)      Bacteria, UA MANY (*)    Squamous Epithelial / LPF 0-5 (*)    All other components within normal limits  I-STAT CG4 LACTIC ACID, ED - Abnormal; Notable for the following:    Lactic Acid, Venous 3.38 (*)    All other components within normal limits  CULTURE, BLOOD (ROUTINE X 2)  CULTURE, BLOOD (ROUTINE X 2)  URINE CULTURE  PROTIME-INR  CBC  BASIC METABOLIC PANEL  I-STAT CG4 LACTIC ACID, ED    EKG  EKG Interpretation None       Radiology Dg Chest 2 View  Result Date: 07/21/2016 CLINICAL DATA:  76 year old male with fever. EXAM: CHEST  2 VIEW COMPARISON:  12/03/2009 and prior radiographs FINDINGS: Cardiomegaly and CABG changes again noted. There is no evidence of focal airspace disease, pulmonary edema, suspicious pulmonary nodule/mass, pleural effusion, or pneumothorax. No acute bony abnormalities are identified. IMPRESSION: Cardiomegaly without evidence of acute cardiopulmonary disease. Electronically Signed   By: Margarette Canada M.D.   On: 07/21/2016 20:13    Procedures Procedures (including critical care time)  Medications Ordered in ED Medications  insulin aspart (novoLOG) injection 0-15 Units (not administered)  acetaminophen (TYLENOL) tablet 650 mg (not administered)  Warfarin - Pharmacist Dosing Inpatient (not administered)  metoprolol tartrate (LOPRESSOR) tablet 100 mg (not administered)  multivitamin with minerals tablet 1 tablet (not administered)  pravastatin (PRAVACHOL) tablet 40 mg (not administered)  thiamine (VITAMIN B-1) tablet 100 mg (not administered)  vitamin B-12 (CYANOCOBALAMIN) tablet 250 mcg (not administered)  aspirin EC tablet 81 mg (not administered)  folic acid (FOLVITE) tablet 0.5 mg (not administered)  cefTRIAXone (ROCEPHIN) 1 g in dextrose 5 % 50 mL IVPB (not administered)  0.9 %  sodium chloride infusion (not administered)  ondansetron (ZOFRAN) tablet 4 mg (not administered)    Or  ondansetron (ZOFRAN) injection 4 mg (not administered)   acetaminophen (TYLENOL) tablet 650 mg (650 mg Oral Given 07/21/16 1929)  sodium chloride 0.9 % bolus 1,000 mL (1,000 mLs Intravenous New Bag/Given 07/21/16 2219)  cefTRIAXone (ROCEPHIN) 1 g in dextrose 5 % 50 mL IVPB (1 g Intravenous New Bag/Given 07/21/16 2219)     Initial Impression / Assessment and Plan / ED Course  I have reviewed the triage vital signs and the nursing notes.  Pertinent labs & imaging results that were available during my care of the patient were reviewed by me and considered in my medical decision making (see chart for details).     76 yo M With a chief complaint of increased frequency flank pain and fever. Found to have pyelonephritis. With lactate elevation of 3.4 we'll discuss with hospitalist.  The patients results and plan were reviewed and discussed.   Any x-rays performed were independently reviewed by myself.   Differential diagnosis were considered with the presenting HPI.  Medications  insulin aspart (novoLOG) injection 0-15 Units (not administered)  acetaminophen (TYLENOL) tablet 650 mg (not administered)  Warfarin - Pharmacist Dosing Inpatient (not administered)  metoprolol tartrate (LOPRESSOR) tablet 100 mg (not administered)  multivitamin with minerals tablet 1 tablet (not administered)  pravastatin (PRAVACHOL) tablet 40 mg (not administered)  thiamine (VITAMIN B-1) tablet 100 mg (not administered)  vitamin B-12 (CYANOCOBALAMIN) tablet 250 mcg (not administered)  aspirin EC tablet 81 mg (not administered)  folic acid (FOLVITE) tablet 0.5 mg (not administered)  cefTRIAXone (ROCEPHIN) 1 g in dextrose 5 % 50 mL IVPB (not administered)  0.9 %  sodium chloride infusion (not administered)  ondansetron (ZOFRAN) tablet 4 mg (not administered)    Or  ondansetron (ZOFRAN) injection 4 mg (not administered)  acetaminophen (TYLENOL) tablet 650 mg (650 mg Oral Given 07/21/16 1929)  sodium chloride 0.9 % bolus 1,000 mL (1,000 mLs Intravenous New Bag/Given  07/21/16 2219)  cefTRIAXone (ROCEPHIN) 1 g in dextrose 5 % 50 mL IVPB (1 g Intravenous New Bag/Given 07/21/16 2219)    Vitals:   07/21/16 2145 07/21/16 2200 07/21/16 2215 07/21/16 2230  BP: 133/68 (!) 153/77 140/90 137/77  Pulse: 71 75 75 79  Resp: (!) 24 (!) 24 20 (!) 23  Temp:      TempSrc:      SpO2: 92% 93% 92% 94%  Weight:      Height:        Final diagnoses:  Pyelonephritis  Sepsis secondary to UTI [facility])    Admission/ observation were discussed with the admitting physician, patient and/or family and they are comfortable with the plan.    Final Clinical Impressions(s) / ED Diagnoses   Final diagnoses:  Pyelonephritis  Sepsis secondary to UTI [facility])    New Prescriptions New Prescriptions   No medications on file     Deno Etienne, DO 07/21/16 2305

## 2016-07-21 NOTE — ED Triage Notes (Signed)
C/o shaking last night.  Temp 103.  Vomited x 1 last night after taking Tylenol.  Felt better today but started shaking again this evening.  Reports lower back pain, dark, foul smelling urine, and dysuria.

## 2016-07-21 NOTE — ED Notes (Signed)
Lactic Acid 3.38

## 2016-07-21 NOTE — H&P (Signed)
History and Physical    Jordan Clayton BSW:[id number] DOB: December 23, 1940 DOA: 07/21/2016  PCP: Leonard Downing, MD  Patient coming from: Home  I have personally briefly reviewed patient's old medical records in [place]  Chief Complaint: Fever  HPI: Jordan Clayton is a 76 y.o. male with medical history significant of A.Fib on coumadin, DM2, HTN, CAD.  Patient presents to the ED with c/o fever, onset 2 days ago.  Persistent at home.  Tm 103 at home (100.5 in ED).  No N/V/D, did have some back pain but none now he says.  Hasnt taken anything for symptoms.   ED Course: UA demonstrates a nitrite positive UTI.  No h/o prostate problems, urinary retention, foley catheter.   Review of Systems: As per HPI otherwise 10 point review of systems negative.   Past Medical History:  Diagnosis Date  . Acute MI ([place])    subendocardial  . CAD (coronary artery disease)   . Chronic atrial fibrillation ([place])   . Delirium   . DM2 (diabetes mellitus, type 2) ([place])   . Drug therapy    coumadin  . HTN (hypertension)   . Hypernatremia   . Mixed hyperlipidemia   . Septic shock(785.52)     Past Surgical History:  Procedure Laterality Date  . CORONARY ARTERY BYPASS GRAFT  1994   LIMA to LAD per [facility]     reports that he has never smoked. His smokeless tobacco use includes Chew. He reports that he does not drink alcohol or use drugs.  Allergies  Allergen Reactions  . Atorvastatin   . Oxycodone-Acetaminophen     REACTION: Hallucinations  . Quetiapine     REACTION: disorientated  . Rosuvastatin     REACTION: Reaction not known    Family History  Problem Relation Age of Onset  . Coronary artery disease Unknown        family hx  . Hypertension Unknown        family hx     Prior to Admission medications   Medication Sig Start Date End Date Taking? Authorizing Provider  acetaminophen (TYLENOL) 500 MG tablet Take 500 mg by mouth every 6 (six) hours as needed for fever.   Yes  [provider]  aspirin EC 81 MG tablet Take 81 mg by mouth every morning.   Yes [provider]  Coenzyme Q10 (COQ10 PO) Take 100 mg by mouth daily.   Yes [provider]  folic acid (FOLVITE) 466 MCG tablet Take 400 mcg by mouth daily. 1/2 tab daily   Yes [provider]  LUTEIN PO Take 1 tablet by mouth daily.   Yes [provider]  MAGNESIUM CITRATE PO Take 400 mg by mouth daily.   Yes [provider]  metFORMIN (GLUCOPHAGE) 1000 MG tablet Take 1,000 mg by mouth 2 (two) times daily.     Yes [provider]  metoprolol (LOPRESSOR) 100 MG tablet Take 1 tablet (100 mg total) by mouth 2 (two) times daily. 12/12/15  Yes Lelon Perla, MD  Multiple Vitamin (MULTIVITAMIN) tablet Take 1 tablet by mouth daily.     Yes [provider]  pravastatin (PRAVACHOL) 40 MG tablet Take 40 mg by mouth at bedtime.    Yes [provider]  quinapril (ACCUPRIL) 20 MG tablet TAKE ONE TABLET BY MOUTH ONCE DAILY AT BEDTIME 03/03/16  Yes Crenshaw, Denice Bors, MD  Thiamine HCl (VITAMIN B-1) 100 MG tablet Take 100 mg by mouth daily.  Yes [provider]  vitamin B-12 (CYANOCOBALAMIN) 250 MCG tablet Take 250 mcg by mouth daily.     Yes [provider]  warfarin (COUMADIN) 7.5 MG tablet TAKE ONE TABLET BY MOUTH ONCE DAILY AS DIRECTED BY COUMADIN CLINIC Patient taking differently: TAKE ONE TABLET BY MOUTH ONCE DAILY AS DIRECTED BY COUMADIN CLINIC ([place], [place], [place], [place], [place]) 05/31/16  Yes Crenshaw, Denice Bors, MD  furosemide (LASIX) 40 MG tablet Take 1 tablet (40 mg total) by mouth daily as needed. Daily as needed for swelling Patient taking differently: Take 40 mg by mouth daily as needed for fluid or edema. Daily as needed for swelling 08/31/11   Wall, Marijo Conception, MD  nitroGLYCERIN (NITROSTAT) 0.4 MG SL tablet Place 1 tablet (0.4 mg total) under the tongue every 5 (five) minutes as needed (up to 3 tablets). Patient  taking differently: Place 0.4 mg under the tongue every 5 (five) minutes as needed for chest pain (up to 3 tablets).  09/26/15   Lelon Perla, MD  warfarin (COUMADIN) 5 MG tablet Take 1 tablet as directed by coumadin clinic (also takes 7.5 mg tablets) Patient taking differently: Take 5 mg by mouth See admin instructions. Take 1 tablet as directed by coumadin clinic (also takes 7.5 mg tablets) Take 5 mg on Wednesday and Saturday 05/11/16   Lelon Perla, MD    Physical Exam: Vitals:   07/21/16 1931 07/21/16 2045 07/21/16 2115 07/21/16 2145  BP:  (!) 154/70 133/72 133/68  Pulse:  74 71 71  Resp:  (!) 22 (!) 21 (!) 24  Temp:      TempSrc:      SpO2:  93% 91% 92%  Weight: 98 kg (216 lb)     Height: 5\' 11"  (1.803 m)       Constitutional: NAD, calm, comfortable Eyes: PERRL, lids and conjunctivae normal ENMT: Mucous membranes are moist. Posterior pharynx clear of any exudate or lesions.Normal dentition.  Neck: normal, supple, no masses, no thyromegaly Respiratory: clear to auscultation bilaterally, no wheezing, no crackles. Normal respiratory effort. No accessory muscle use.  Cardiovascular: Regular rate and rhythm, no murmurs / rubs / gallops. No extremity edema. 2+ pedal pulses. No carotid bruits.  Abdomen: no tenderness, no masses palpated. No hepatosplenomegaly. Bowel sounds positive.  Musculoskeletal: no clubbing / cyanosis. No joint deformity upper and lower extremities. Good ROM, no contractures. Normal muscle tone.  Skin: no rashes, lesions, ulcers. No induration Neurologic: CN 2-12 grossly intact. Sensation intact, DTR normal. Strength 5/5 in all 4.  Psychiatric: Normal judgment and insight. Alert and oriented x 3. Normal mood.    Labs on Admission: I have personally reviewed following labs and imaging studies  CBC:  Recent Labs Lab 07/21/16 1939  WBC 14.2*  NEUTROABS 12.5*  HGB 12.7*  HCT 39.2  MCV 101.8*  PLT 841   Basic Metabolic Panel:  Recent Labs Lab  07/21/16 1939  NA 139  K 4.2  CL 108  CO2 21*  GLUCOSE 171*  BUN 25*  CREATININE 1.23  CALCIUM 9.4   GFR: Estimated Creatinine Clearance: 59.1 mL/min (by C-G formula based on SCr of 1.23 mg/dL). Liver Function Tests:  Recent Labs Lab 07/21/16 1939  AST 27  ALT 19  ALKPHOS 36*  BILITOT 1.8*  PROT 6.4*  ALBUMIN 3.7   No results for input(s): LIPASE, AMYLASE in the last 168 hours. No results for input(s): AMMONIA in the last 168 hours. Coagulation Profile:  Recent Labs Lab 07/21/16 1939  INR 3.56   Cardiac Enzymes: No results for input(s): CKTOTAL, CKMB, CKMBINDEX, TROPONINI in the last 168 hours. BNP (last 3 results) No results for input(s): PROBNP in the last 8760 hours. HbA1C: No results for input(s): HGBA1C in the last 72 hours. CBG: No results for input(s): GLUCAP in the last 168 hours. Lipid Profile: No results for input(s): CHOL, HDL, LDLCALC, TRIG, CHOLHDL, LDLDIRECT in the last 72 hours. Thyroid Function Tests: No results for input(s): TSH, T4TOTAL, FREET4, T3FREE, THYROIDAB in the last 72 hours. Anemia Panel: No results for input(s): VITAMINB12, FOLATE, FERRITIN, TIBC, IRON, RETICCTPCT in the last 72 hours. Urine analysis:    Component Value Date/Time   COLORURINE YELLOW 07/21/2016 1929   APPEARANCEUR HAZY (A) 07/21/2016 1929   LABSPEC 1.027 07/21/2016 1929   PHURINE 5.0 07/21/2016 1929   GLUCOSEU NEGATIVE 07/21/2016 1929   HGBUR MODERATE (A) 07/21/2016 1929   BILIRUBINUR NEGATIVE 07/21/2016 1929   KETONESUR NEGATIVE 07/21/2016 1929   PROTEINUR 100 (A) 07/21/2016 1929   UROBILINOGEN 1.0 07/31/2009 0949   NITRITE POSITIVE (A) 07/21/2016 1929   LEUKOCYTESUR LARGE (A) 07/21/2016 1929    Radiological Exams on Admission: Dg Chest 2 View  Result Date: 07/21/2016 CLINICAL DATA:  76 year old male with fever. EXAM: CHEST  2 VIEW COMPARISON:  12/03/2009 and prior radiographs FINDINGS: Cardiomegaly and CABG changes again noted. There is no evidence of  focal airspace disease, pulmonary edema, suspicious pulmonary nodule/mass, pleural effusion, or pneumothorax. No acute bony abnormalities are identified. IMPRESSION: Cardiomegaly without evidence of acute cardiopulmonary disease. Electronically Signed   By: Margarette Canada M.D.   On: 07/21/2016 20:13    EKG: Independently reviewed.  Assessment/Plan Principal Problem:   Sepsis secondary to UTI [facility]) Active Problems:   DM2 (diabetes mellitus, type 2) (H[facility])   Essential hypertension   ATRIAL FIBRILLATION, CHRONIC    1. Sepsis secondary to UTI - 1. IVF: 1L bolus and 125 cc/hr 2. Hold lasix 3. Rocephin 4. BCx and UCx pending 5. Repeat CBC/BMP in AM 2. A.Fib - 1. Continue lopressor 2. Continue coumadin per pharm 3. DM2 - 1. Holding metformin 2. SSI AC mod scale 4. HTN - Holding ACEi due to sepsis  DVT prophylaxis: Coumadin Code Status: Full Family Communication: Family at bedside Disposition Plan: Home after admit Consults called: None Admission status: Place in [place], [place] Hospitalists Pager [phone]  If 7AM-7PM, please contact day team taking care of patient [email or web address] Password [facility]  07/21/2016, 10:13 PM

## 2016-07-22 DIAGNOSIS — I10 Essential (primary) hypertension: Secondary | ICD-10-CM

## 2016-07-22 DIAGNOSIS — A419 Sepsis, unspecified organism: Secondary | ICD-10-CM | POA: Diagnosis not present

## 2016-07-22 DIAGNOSIS — I482 Chronic atrial fibrillation: Secondary | ICD-10-CM

## 2016-07-22 DIAGNOSIS — N390 Urinary tract infection, site not specified: Secondary | ICD-10-CM | POA: Diagnosis not present

## 2016-07-22 LAB — CBC
HCT: 35.1 % — ABNORMAL LOW (ref 39.0–52.0)
Hemoglobin: 11.2 g/dL — ABNORMAL LOW (ref 13.0–17.0)
MCH: 32.1 pg (ref 26.0–34.0)
MCHC: 31.9 g/dL (ref 30.0–36.0)
MCV: 100.6 fL — AB (ref 78.0–100.0)
PLATELETS: 121 10*3/uL — AB (ref 150–400)
RBC: 3.49 MIL/uL — ABNORMAL LOW (ref 4.22–5.81)
RDW: 14.7 % (ref 11.5–15.5)
WBC: 7.3 10*3/uL (ref 4.0–10.5)

## 2016-07-22 LAB — GLUCOSE, CAPILLARY
GLUCOSE-CAPILLARY: 115 mg/dL — AB (ref 65–99)
GLUCOSE-CAPILLARY: 131 mg/dL — AB (ref 65–99)
GLUCOSE-CAPILLARY: 136 mg/dL — AB (ref 65–99)
GLUCOSE-CAPILLARY: 138 mg/dL — AB (ref 65–99)
GLUCOSE-CAPILLARY: 155 mg/dL — AB (ref 65–99)

## 2016-07-22 LAB — BASIC METABOLIC PANEL
ANION GAP: 5 (ref 5–15)
BUN: 20 mg/dL (ref 6–20)
CHLORIDE: 110 mmol/L (ref 101–111)
CO2: 24 mmol/L (ref 22–32)
Calcium: 8.5 mg/dL — ABNORMAL LOW (ref 8.9–10.3)
Creatinine, Ser: 1.18 mg/dL (ref 0.61–1.24)
GFR, EST NON AFRICAN AMERICAN: 57 mL/min — AB (ref >60)
Glucose, Bld: 138 mg/dL — ABNORMAL HIGH (ref 65–99)
POTASSIUM: 4.1 mmol/L (ref 3.5–5.1)
SODIUM: 139 mmol/L (ref 135–145)

## 2016-07-22 LAB — PROTIME-INR
INR: 3.42
PROTHROMBIN TIME: 35.3 s — AB (ref 11.4–15.2)

## 2016-07-22 MED ORDER — FUROSEMIDE 10 MG/ML IJ SOLN
40.0000 mg | Freq: Once | INTRAMUSCULAR | Status: AC
  Administered 2016-07-22: 40 mg via INTRAVENOUS
  Filled 2016-07-22: qty 4

## 2016-07-22 MED ORDER — VITAMIN B-12 1000 MCG PO TABS
1000.0000 ug | ORAL_TABLET | Freq: Every day | ORAL | Status: DC
  Administered 2016-07-22 – 2016-07-23 (×2): 1000 ug via ORAL
  Filled 2016-07-22 (×2): qty 1

## 2016-07-22 MED ORDER — QUINAPRIL HCL 10 MG PO TABS
20.0000 mg | ORAL_TABLET | Freq: Every day | ORAL | Status: DC
  Administered 2016-07-23: 20 mg via ORAL
  Filled 2016-07-22: qty 2

## 2016-07-22 MED ORDER — FOLIC ACID 1 MG PO TABS
1.0000 mg | ORAL_TABLET | Freq: Every day | ORAL | Status: DC
Start: 2016-07-22 — End: 2016-07-23
  Administered 2016-07-22 – 2016-07-23 (×2): 1 mg via ORAL
  Filled 2016-07-22 (×3): qty 1

## 2016-07-22 NOTE — Progress Notes (Signed)
PROGRESS NOTE        PATIENT DETAILS Name: Jordan Clayton Age: 76 y.o. Sex: male Date of Birth: 09/26/1940 Admit Date: 07/21/2016 Admitting Physician Etta Quill, DO MCN:[id number], Curt Jews, MD  Brief Narrative: Patient is a 76 y.o. male with past medical history significant for Atrial fibrillation on coumadin, Type II DM, HTN and CAD. Patient was experiencing fevers at home with some back pain and came to the ED last night. UA was nitrite positive with large leukocytes. RR was 23, afebrile, HR 73 bt/min but WBC count of 14.2 and lactic acid elevated at 3.38. He was admitted for further treatment and started on ceftriaxone.   Subjective: Patient sitting comfortably in chair, has no complaints. Denies chest pain, abdominal pain, nausea, dysuria. Admits to some shortness of breath, worse with laying down and urinary frequency.   Assessment/Plan: Sepsis secondary to UTI: Afebrile overnight and leukocytosis resolved. Patient denies infectious symptoms. Continue acetaminophen PRN for fever and ceftriaxone. Urine and blood cultures pending.   DM2 (diabetes mellitus, type 2): CBG stable, continue SSI  Essential hypertension: BP trending up, most recent reading today 164/75. Patient takes quinapril at home but was not ordered upon admission due to sepsis. Continue metoprolol, restart quinapril.   Chronic A. Fib: Continue ASA. Pharmacy dosing coumadin, currently holding due to supra-therapeutic INR.   Shortness of breath: Patient usually takes lasix at home, not ordered upon admission due to sepsis but patient appears to be slightly fluid overloaded. Restart lasix. Monitor I&O's.   DVT Prophylaxis: Warfarin per pharmacy  Code Status: Full  Family Communication: Spouse at bedside  Disposition Plan: Remain inpatient-Home tomorrow pending urine cx results.   Antimicrobial agents: Anti-infectives    Start     Dose/Rate Route Frequency Ordered Stop   07/22/16 2100  cefTRIAXone (ROCEPHIN) 1 g in dextrose 5 % 50 mL IVPB     1 g 100 mL/hr over 30 Minutes Intravenous Every 24 hours 07/21/16 2146     07/21/16 2100  cefTRIAXone (ROCEPHIN) 1 g in dextrose 5 % 50 mL IVPB     1 g 100 mL/hr over 30 Minutes Intravenous  Once 07/21/16 2055 07/21/16 2249      Procedures: None  CONSULTS:  None  Time spent: 25 minutes-Greater than 50% of this time was spent in counseling, explanation of diagnosis, planning of further management, and coordination of care.  MEDICATIONS: Scheduled Meds: . aspirin EC  81 mg Oral BH-q7a  . folic acid  1 mg Oral Daily  . insulin aspart  0-15 Units Subcutaneous TID WC  . metoprolol tartrate  100 mg Oral BID  . multivitamin with minerals  1 tablet Oral Daily  . pravastatin  40 mg Oral QHS  . thiamine  100 mg Oral Daily  . vitamin B-12  1,000 mcg Oral Daily  . Warfarin - Pharmacist Dosing Inpatient   Does not apply q1800   Continuous Infusions: . cefTRIAXone (ROCEPHIN)  IV     PRN Meds:.acetaminophen, ondansetron **OR** ondansetron (ZOFRAN) IV   PHYSICAL EXAM: Vital signs: Vitals:   07/21/16 2347 07/22/16 0524 07/22/16 1000 07/22/16 1323  BP: 115/60 (!) 144/61  (!) 164/75  Pulse: 71 (!) 58 77 (!) 130  Resp: 18 19  19   Temp: 98.3 F (36.8 C) 98 F (36.7 C)  97.6 F (36.4 C)  TempSrc: Oral Oral  Oral  SpO2: 94% 94% 96% 95%  Weight: 101.6 kg (224 lb)     Height: 5\' 11"  (1.803 m)      Filed Weights   07/21/16 1931 07/21/16 2347  Weight: 98 kg (216 lb) 101.6 kg (224 lb)   Body mass index is 31.24 kg/m.   General appearance: Awake, alert, not in any distress. Non toxic Looking HEENT: Atraumatic and Normocephalic Resp: B/l crackles in base of lungs CVS: S1 S2 heard, irregular rate and rhythm without murmurs.  GI: Bowel sounds present, Non tender, not distended without guarding. Extremities: B/L Lower Ext shows 1+ edema, both legs are warm to touch Neurology:  speech clear, following commands, No  focal neuro deficits.  Psychiatric: Normal judgment and insight. Alert and oriented x 3. Normal mood. Skin: Right lower extremity has scarring from old bacterial infection, otherwise warm and dry  I have personally reviewed following labs and imaging studies  LABORATORY DATA: CBC:  Recent Labs Lab 07/21/16 1939 07/22/16 0442  WBC 14.2* 7.3  NEUTROABS 12.5*  --   HGB 12.7* 11.2*  HCT 39.2 35.1*  MCV 101.8* 100.6*  PLT 164 121*    Basic Metabolic Panel:  Recent Labs Lab 07/21/16 1939 07/22/16 0442  NA 139 139  K 4.2 4.1  CL 108 110  CO2 21* 24  GLUCOSE 171* 138*  BUN 25* 20  CREATININE 1.23 1.18  CALCIUM 9.4 8.5*    GFR: Estimated Creatinine Clearance: 62.6 mL/min (by C-G formula based on SCr of 1.18 mg/dL).  Liver Function Tests:  Recent Labs Lab 07/21/16 1939  AST 27  ALT 19  ALKPHOS 36*  BILITOT 1.8*  PROT 6.4*  ALBUMIN 3.7   No results for input(s): LIPASE, AMYLASE in the last 168 hours. No results for input(s): AMMONIA in the last 168 hours.  Coagulation Profile:  Recent Labs Lab 07/21/16 1939 07/22/16 0442  INR 3.56 3.42    Cardiac Enzymes: No results for input(s): CKTOTAL, CKMB, CKMBINDEX, TROPONINI in the last 168 hours.  BNP (last 3 results) No results for input(s): PROBNP in the last 8760 hours.  HbA1C: No results for input(s): HGBA1C in the last 72 hours.  CBG:  Recent Labs Lab 07/22/16 0002 07/22/16 0741 07/22/16 1211  GLUCAP 155* 115* 138*    Lipid Profile: No results for input(s): CHOL, HDL, LDLCALC, TRIG, CHOLHDL, LDLDIRECT in the last 72 hours.  Thyroid Function Tests: No results for input(s): TSH, T4TOTAL, FREET4, T3FREE, THYROIDAB in the last 72 hours.  Anemia Panel: No results for input(s): VITAMINB12, FOLATE, FERRITIN, TIBC, IRON, RETICCTPCT in the last 72 hours.  Urine analysis:    Component Value Date/Time   COLORURINE YELLOW 07/21/2016 1929   APPEARANCEUR HAZY (A) 07/21/2016 1929   LABSPEC 1.027  07/21/2016 1929   PHURINE 5.0 07/21/2016 1929   GLUCOSEU NEGATIVE 07/21/2016 1929   HGBUR MODERATE (A) 07/21/2016 1929   BILIRUBINUR NEGATIVE 07/21/2016 1929   KETONESUR NEGATIVE 07/21/2016 1929   PROTEINUR 100 (A) 07/21/2016 1929   UROBILINOGEN 1.0 07/31/2009 0949   NITRITE POSITIVE (A) 07/21/2016 1929   LEUKOCYTESUR LARGE (A) 07/21/2016 1929    Sepsis Labs: Lactic Acid, Venous    Component Value Date/Time   LATICACIDVEN 3.38 ([place]) 07/21/2016 1957    MICROBIOLOGY: No results found for this or any previous visit (from the past 240 hour(s)).  RADIOLOGY STUDIES/RESULTS: Dg Chest 2 View  Result Date: 07/21/2016 CLINICAL DATA:  76 year old male with fever. EXAM: CHEST  2 VIEW COMPARISON:  12/03/2009 and prior radiographs FINDINGS: Cardiomegaly  and CABG changes again noted. There is no evidence of focal airspace disease, pulmonary edema, suspicious pulmonary nodule/mass, pleural effusion, or pneumothorax. No acute bony abnormalities are identified. IMPRESSION: Cardiomegaly without evidence of acute cardiopulmonary disease. Electronically Signed   By: Margarette Canada M.D.   On: 07/21/2016 20:13     LOS: 0 days   Kathleene Hazel, PA-S  [facility]  Attending MD note  Patient was seen, examined,treatment plan was discussed with the PA-S Kathleene Hazel.  I have personally reviewed the clinical findings, lab, imaging studies and management of this patient in detail. I agree with the documentation, as recorded by the PA-S and changes to above note were in bold green  Patient isA 76 year old male with past medical history significant for A. fib, type 2 diabetes mellitus, hypertension and CAD presented to the ED with back pain and fever. Only the ablation was found to have a UTI with severe sepsis and admitted for IV antibiotics.  Patient seen and examined separate from PA, doing well this morning. Problem review of systems patient report some dyspnea on and off worse with laying  down. Also there's lower extremity edema that has been chronic and patient receiving Lasix for it.   On Exam: Gen. 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 fib Shortness of breath - likely secondary to acute on chronic diastolic failure from aggressive fluid hydration  Plan: Continue IV ceftriaxone Follow-up urine and blood cultures BP slightly above goal will resume BP medications Echocardiogram ordered, last echo was done in 2011 showing EF of 45-50% Given mild fluid overload will give IV Lasix and resume by mouth Lasix in a.m. Monitor INO's Check BMP and CBC in a.m.   Rest as above  Chipper Oman, MD   If 7PM-7AM, please contact night-coverage [email or web address] Password [facility] 07/22/2016, 2:43 PM

## 2016-07-22 NOTE — Progress Notes (Signed)
Jordan Clayton is a 76 y.o. male patient admitted from ED awake, alert - oriented  X 4 - no acute distress noted.  VSS - Blood pressure 115/60, pulse 71, temperature 98.3 F (36.8 C), temperature source Oral, resp. rate 18, height 5\' 11"  (1.803 m), weight 101.6 kg (224 lb), SpO2 94 %.    IV in place, occlusive dsg intact without redness.  Orientation to room, and floor completed with information packet given to patient/family. Admission INP armband ID verified with patient/family, and in place.   SR up x 2, fall assessment complete, with patient and family able to verbalize understanding of risk associated with falls, and verbalized understanding to call nsg before up out of bed.  Call light within reach, patient able to voice, and demonstrate understanding.   Will cont to eval and treat per MD orders.  Vidal Schwalbe, RN 07/22/2016 12:07 AM

## 2016-07-22 NOTE — Progress Notes (Signed)
ANTICOAGULATION CONSULT NOTE - FOLLOW UP  Pharmacy Consult: Coumadin Indication: atrial fibrillation  Allergies  Allergen Reactions  . Atorvastatin   . Oxycodone-Acetaminophen     REACTION: Hallucinations  . Quetiapine     REACTION: disorientated  . Rosuvastatin     REACTION: Reaction not known    Patient Measurements: Height: 5\' 11"  (180.3 cm) Weight: 224 lb (101.6 kg) IBW/kg (Calculated) : 75.3  Vital Signs: Temp: 98 F (36.7 C) (06/28 0524) Temp Source: Oral (06/28 0524) BP: 144/61 (06/28 0524) Pulse Rate: 77 (06/28 1000)  Labs:  Recent Labs  07/21/16 1939 07/22/16 0442  HGB 12.7* 11.2*  HCT 39.2 35.1*  PLT 164 121*  LABPROT 36.4* 35.3*  INR 3.56 3.42  CREATININE 1.23 1.18    Estimated Creatinine Clearance: 62.6 mL/min (by C-G formula based on SCr of 1.18 mg/dL).   Assessment: 36 YOM on chronic Coumadin for AFib.  Patient's INR was supra-therapeutic on admit and has trended down slightly.  His platelet count is trending down; however, no overt bleeding documented.   Goal of Therapy:  INR 2-3    Plan:  - Continue to hold Coumadin - Daily PT / INR - Monitor closely for s/sx of bleeding    Kahealani Yankovich D. Mina Marble, PharmD, BCPS Pager:  [phone] 07/22/2016, 11:30 AM

## 2016-07-22 NOTE — Discharge Instructions (Addendum)
Urinary Tract Infection, Adult A urinary tract infection (UTI) is an infection of any part of the urinary tract. The urinary tract includes the:  Kidneys.  Ureters.  Bladder.  Urethra.  These organs make, store, and get rid of pee (urine) in the body. Follow these instructions at home:  Take over-the-counter and prescription medicines only as told by your doctor.  If you were prescribed an antibiotic medicine, take it as told by your doctor. Do not stop taking the antibiotic even if you start to feel better.  Avoid the following drinks: ? Alcohol. ? Caffeine. ? Tea. ? Carbonated drinks.  Drink enough fluid to keep your pee clear or pale yellow.  Keep all follow-up visits as told by your doctor. This is important.  Make sure to: ? Empty your bladder often and completely. Do not to hold pee for long periods of time. ? Empty your bladder before and after sex. ? Wipe from front to back after a bowel movement if you are male. Use each tissue one time when you wipe. Contact a doctor if:  You have back pain.  You have a fever.  You feel sick to your stomach (nauseous).  You throw up (vomit).  Your symptoms do not get better after 3 days.  Your symptoms go away and then come back. Get help right away if:  You have very bad back pain.  You have very bad lower belly (abdominal) pain.  You are throwing up and cannot keep down any medicines or water. This information is not intended to replace advice given to you by your health care provider. Make sure you discuss any questions you have with your health care provider. Document Released: 06/30/2007 Document Revised: 06/19/2015 Document Reviewed: 12/02/2014 Elsevier Interactive Patient Education  2018 [place] on my medicine - Coumadin   (Warfarin)  This medication education was reviewed with me or my healthcare representative as part of my discharge preparation.  The pharmacist that spoke with  me during my hospital stay was:  Saundra Shelling, [facility]  Why was Coumadin prescribed for you? Coumadin was prescribed for you because you have a blood clot or a medical condition that can cause an increased risk of forming blood clots. Blood clots can cause serious health problems by blocking the flow of blood to the heart, lung, or brain. Coumadin can prevent harmful blood clots from forming. As a reminder your indication for Coumadin is:   Stroke Prevention Because Of Atrial Fibrillation  What test will check on my response to Coumadin? While on Coumadin (warfarin) you will need to have an INR test regularly to ensure that your dose is keeping you in the desired range. The INR (international normalized ratio) number is calculated from the result of the laboratory test called prothrombin time (PT).  If an INR APPOINTMENT HAS NOT ALREADY BEEN MADE FOR YOU please schedule an appointment to have this lab work done by your health care provider within 7 days. Your INR goal is usually a number between:  2 to 3 or your provider may give you a more narrow range like 2-2.5.  Ask your health care provider during an office visit what your goal INR is.  What  do you need to  know  About  COUMADIN? Take Coumadin (warfarin) exactly as prescribed by your healthcare provider about the same time each day.  DO NOT stop taking without talking to the doctor who prescribed the medication.  Stopping without  other blood clot prevention medication to take the place of Coumadin may increase your risk of developing a new clot or stroke.  Get refills before you run out.  What do you do if you miss a dose? If you miss a dose, take it as soon as you remember on the same day then continue your regularly scheduled regimen the next day.  Do not take two doses of Coumadin at the same time.  Important Safety Information A possible side effect of Coumadin (Warfarin) is an increased risk of bleeding. You should call your healthcare  provider right away if you experience any of the following: ? Bleeding from an injury or your nose that does not stop. ? Unusual colored urine (red or dark [place]) or unusual colored stools (red or black). ? Unusual bruising for unknown reasons. ? A serious fall or if you hit your head (even if there is no bleeding).  Some foods or medicines interact with Coumadin (warfarin) and might alter your response to warfarin. To help avoid this: ? Eat a balanced diet, maintaining a consistent amount of Vitamin K. ? Notify your provider about major diet changes you plan to make. ? Avoid alcohol or limit your intake to 1 drink for women and 2 drinks for men per day. (1 drink is 5 oz. wine, 12 oz. beer, or 1.5 oz. liquor.)  Make sure that ANY health care provider who prescribes medication for you knows that you are taking Coumadin (warfarin).  Also make sure the healthcare provider who is monitoring your Coumadin knows when you have started a new medication including herbals and non-prescription products.  Coumadin (Warfarin)  Major Drug Interactions  Increased Warfarin Effect Decreased Warfarin Effect  Alcohol (large quantities) Antibiotics (esp. Septra/Bactrim, Flagyl, Cipro) Amiodarone (Cordarone) Aspirin (ASA) Cimetidine (Tagamet) Megestrol (Megace) NSAIDs (ibuprofen, naproxen, etc.) Piroxicam (Feldene) Propafenone (Rythmol SR) Propranolol (Inderal) Isoniazid (INH) Posaconazole (Noxafil) Barbiturates (Phenobarbital) Carbamazepine (Tegretol) Chlordiazepoxide (Librium) Cholestyramine (Questran) Griseofulvin Oral Contraceptives Rifampin Sucralfate (Carafate) Vitamin K   Coumadin (Warfarin) Major Herbal Interactions  Increased Warfarin Effect Decreased Warfarin Effect  Garlic Ginseng Ginkgo biloba Coenzyme Q10 Green tea St. Johns wort    Coumadin (Warfarin) FOOD Interactions  Eat a consistent number of servings per week of foods HIGH in Vitamin K (1 serving =  cup)  Collards  (cooked, or boiled & drained) Kale (cooked, or boiled & drained) Mustard greens (cooked, or boiled & drained) Parsley *serving size only =  cup Spinach (cooked, or boiled & drained) Swiss chard (cooked, or boiled & drained) Turnip greens (cooked, or boiled & drained)  Eat a consistent number of servings per week of foods MEDIUM-HIGH in Vitamin K (1 serving = 1 cup)  Asparagus (cooked, or boiled & drained) Broccoli (cooked, boiled & drained, or raw & chopped) Brussel sprouts (cooked, or boiled & drained) *serving size only =  cup Lettuce, raw (green leaf, endive, romaine) Spinach, raw Turnip greens, raw & chopped   These websites have more information on Coumadin (warfarin):  FailFactory.se; VeganReport.com.au;

## 2016-07-23 ENCOUNTER — Observation Stay (HOSPITAL_BASED_OUTPATIENT_CLINIC_OR_DEPARTMENT_OTHER): Payer: Medicare Other

## 2016-07-23 DIAGNOSIS — A419 Sepsis, unspecified organism: Secondary | ICD-10-CM | POA: Diagnosis not present

## 2016-07-23 DIAGNOSIS — I360 Nonrheumatic tricuspid (valve) stenosis: Secondary | ICD-10-CM | POA: Diagnosis not present

## 2016-07-23 DIAGNOSIS — I10 Essential (primary) hypertension: Secondary | ICD-10-CM | POA: Diagnosis not present

## 2016-07-23 DIAGNOSIS — I482 Chronic atrial fibrillation: Secondary | ICD-10-CM | POA: Diagnosis not present

## 2016-07-23 DIAGNOSIS — N390 Urinary tract infection, site not specified: Secondary | ICD-10-CM | POA: Diagnosis not present

## 2016-07-23 LAB — BASIC METABOLIC PANEL
ANION GAP: 7 (ref 5–15)
BUN: 17 mg/dL (ref 6–20)
CHLORIDE: 106 mmol/L (ref 101–111)
CO2: 28 mmol/L (ref 22–32)
CREATININE: 1.2 mg/dL (ref 0.61–1.24)
Calcium: 9 mg/dL (ref 8.9–10.3)
GFR calc Af Amer: >60 mL/min (ref >60)
GFR calc non Af Amer: 56 mL/min — ABNORMAL LOW (ref >60)
Glucose, Bld: 133 mg/dL — ABNORMAL HIGH (ref 65–99)
Potassium: 4.2 mmol/L (ref 3.5–5.1)
Sodium: 141 mmol/L (ref 135–145)

## 2016-07-23 LAB — URINE CULTURE: Culture: <10000 — AB

## 2016-07-23 LAB — GLUCOSE, CAPILLARY
GLUCOSE-CAPILLARY: 145 mg/dL — AB (ref 65–99)
Glucose-Capillary: 134 mg/dL — ABNORMAL HIGH (ref 65–99)
Glucose-Capillary: 240 mg/dL — ABNORMAL HIGH (ref 65–99)

## 2016-07-23 LAB — PROTIME-INR
INR: 1.93
Prothrombin Time: 22.3 seconds — ABNORMAL HIGH (ref 11.4–15.2)

## 2016-07-23 LAB — ECHOCARDIOGRAM COMPLETE
Height: 71 in
WEIGHTICAEL: 3584 [oz_av]

## 2016-07-23 LAB — CBC
HEMATOCRIT: 38.9 % — AB (ref 39.0–52.0)
HEMOGLOBIN: 12.2 g/dL — AB (ref 13.0–17.0)
MCH: 31.8 pg (ref 26.0–34.0)
MCHC: 31.4 g/dL (ref 30.0–36.0)
MCV: 101.3 fL — ABNORMAL HIGH (ref 78.0–100.0)
Platelets: 164 10*3/uL (ref 150–400)
RBC: 3.84 MIL/uL — ABNORMAL LOW (ref 4.22–5.81)
RDW: 14.7 % (ref 11.5–15.5)
WBC: 8.4 10*3/uL (ref 4.0–10.5)

## 2016-07-23 MED ORDER — WARFARIN SODIUM 5 MG PO TABS
5.0000 mg | ORAL_TABLET | Freq: Once | ORAL | Status: AC
  Administered 2016-07-23: 5 mg via ORAL
  Filled 2016-07-23: qty 1

## 2016-07-23 MED ORDER — CEFPODOXIME PROXETIL 200 MG PO TABS: 200.0000 mg | ORAL_TABLET | Freq: Two times a day (BID) | ORAL | 0 refills | Status: AC

## 2016-07-23 MED ORDER — FUROSEMIDE 40 MG PO TABS: 40.0000 mg | ORAL_TABLET | Freq: Every day | ORAL | 0 refills | Status: DC

## 2016-07-23 NOTE — Care Management Note (Signed)
Case Management Note  Patient Details  Name: Jordan Clayton MRN: [id number] Date of Birth: 1940-06-25  Subjective/Objective:  Pt admitted on 07/21/16 with sepsis secondary to UTI.  PTA, pt independent, lives at home with spouse.  He ambulates with a cane at home.  PCP is Claris Gower; pt is able to get Rx meds without difficulty.                   Action/Plan: Pt for discharge home today with spouse, per MD.  No discharge needs identified.  Pt/wife deny any needs for home.   Expected Discharge Date:     07/23/16             Expected Discharge Plan:  Home/Self Care  In-House Referral:     Discharge planning Services  CM Consult  Post Acute Care Choice:    Choice offered to:     DME Arranged:    DME Agency:     HH Arranged:    HH Agency:     Status of Service:  Completed, signed off  If discussed at [employer] of Stay Meetings, dates discussed:    Additional Comments:  Ella Bodo, RN 07/23/2016, 3:01 PM

## 2016-07-23 NOTE — Care Management Obs Status (Signed)
[facility] NOTIFICATION   Patient Details  Name: Jordan Clayton MRN: [id number] Date of Birth: [birth year]   Medicare Observation Status Notification Given:  Yes    Ella Bodo, RN 07/23/2016, 3:00 PM

## 2016-07-23 NOTE — Progress Notes (Signed)
ANTICOAGULATION CONSULT NOTE - FOLLOW UP  Pharmacy Consult: Coumadin Indication: atrial fibrillation  Allergies  Allergen Reactions  . Atorvastatin   . Oxycodone-Acetaminophen     REACTION: Hallucinations  . Quetiapine     REACTION: disorientated  . Rosuvastatin     REACTION: Reaction not known    Patient Measurements: Height: 5\' 11"  (180.3 cm) Weight: 224 lb (101.6 kg) IBW/kg (Calculated) : 75.3  Vital Signs: Temp: 98.6 F (37 C) (06/29 0600) Temp Source: Oral (06/29 0600) BP: 149/79 (06/29 0600) Pulse Rate: 84 (06/29 0600)  Labs:  Recent Labs  07/21/16 1939 07/22/16 0442 07/23/16 0446  HGB 12.7* 11.2* 12.2*  HCT 39.2 35.1* 38.9*  PLT 164 121* 164  LABPROT 36.4* 35.3* 22.3*  INR 3.56 3.42 1.93  CREATININE 1.23 1.18 1.20    Estimated Creatinine Clearance: 61.6 mL/min (by C-G formula based on SCr of 1.2 mg/dL).   Assessment: 31 YOM on chronic Coumadin for AFib.  Patient's INR was supra-therapeutic on admit and has trended down to goal range.  Pltc is corrected and Hg is stable.  No bleeding noted   Goal of Therapy:  INR 2-3    Plan:  - Coumadin 5mg  po x 1 today - Daily PT / INR - Monitor closely for s/sx of bleeding   Lewie Chamber., PharmD [facility]

## 2016-07-23 NOTE — Discharge Summary (Signed)
Physician Discharge Summary  ROHITH FAUTH  PIR:[id number]  DOB: 03/15/1940  DOA: 07/21/2016 PCP: Leonard Downing, MD  Admit date: 07/21/2016 Discharge date: 07/23/2016  Admitted From: Home  Disposition:  Home  Recommendations for Outpatient Follow-up:  1. Follow up with PCP in 3-5 days 2. Please obtain BMP/CBC in one week to monitor Hgb and renal function  3. Please follow up on the following pending results: Echocardiogram, final blood cultures   Discharge Condition: Stable  CODE STATUS: FULL  Diet recommendation: Heart Healthy    Brief/Interim Summary: Patient isA 76 year old male with past medical history significant for A. fib, type 2 diabetes mellitus, hypertension and CAD presented to the ED with back pain and fever. Upon ED evaluation was found to have a UTI with severe sepsis and admitted for IVF and IV antibiotics. Patient responded well to treatment, with resolution of sepsis physiology. After aggressive hydration patient has some sings of fluid overload. Lasix was given with significant improvement of edema and lung sounds. Patient has clinically improved and will be discharge home to complete a course of oral abx.   Subjective: Patient seen and examined on the day of discharge. Patient report significant improvement. He denies, SOB, cough, chest pain, palpitations and dizziness  Discharge Diagnoses/Hospital Course:  Impression: Sepsis secondary to UTI - sepsis physiology resolved  Urine cultures with unsignificant growth  Initially treated with Rocephin - discharge on Vantin to complete total of 7 days  Blood culture no grow thus far Follow up with PCP in 3-5 days   Acute on chronic diastolic CHF - 2/2 to aggressive IVF hydration Mild overload - treated with IV lasix 40 mg OTO which immediatly improved - overnight diuresis 3.5L  Will discharge patient on Lasix daily  ECHO was complete - awaiting results - can follow as outpatient. Patient advised to follow up  with cardiology   Diabetes mellitus type 2 CBG's stable during hospital stay  No changes in medication were made   Essential hypertension Initially BP medications were held as BP was soft due to sepsis, as sepsis improvement BP started to rise and home medications were resumed No changes in medications were made   Chronic A. fib HR and Rhythm was well controlled during hospital stay  Patient on Warfarin  Continue current home management   All other chronic medical condition were stable during the hospitalization.  On the day of the discharge the patient's vitals were stable, and no other acute medical condition were reported by patient. Patient was felt safe to be discharge to home   Discharge Instructions  You were cared for by a hospitalist during your hospital stay. If you have any questions about your discharge medications or the care you received while you were in the hospital after you are discharged, you can call the unit and asked to speak with the hospitalist on call if the hospitalist that took care of you is not available. Once you are discharged, your primary care physician will handle any further medical issues. Please note that NO REFILLS for any discharge medications will be authorized once you are discharged, as it is imperative that you return to your primary care physician (or establish a relationship with a primary care physician if you do not have one) for your aftercare needs so that they can reassess your need for medications and monitor your lab values.  Discharge Instructions    (HEART FAILURE PATIENTS) Call MD:  Anytime you have any of the following symptoms: 1) 3  pound weight gain in 24 hours or 5 pounds in 1 week 2) shortness of breath, with or without a dry hacking cough 3) swelling in the hands, feet or stomach 4) if you have to sleep on extra pillows at night in order to breathe.    Complete by:  As directed    Call MD for:  difficulty breathing, headache or  visual disturbances    Complete by:  As directed    Call MD for:  extreme fatigue    Complete by:  As directed    Call MD for:  hives    Complete by:  As directed    Call MD for:  persistant dizziness or light-headedness    Complete by:  As directed    Call MD for:  persistant nausea and vomiting    Complete by:  As directed    Call MD for:  redness, tenderness, or signs of infection (pain, swelling, redness, odor or green/yellow discharge around incision site)    Complete by:  As directed    Call MD for:  severe uncontrolled pain    Complete by:  As directed    Call MD for:  temperature >100.4    Complete by:  As directed    Diet - low sodium heart healthy    Complete by:  As directed    Increase activity slowly    Complete by:  As directed      Allergies as of 07/23/2016      Reactions   Atorvastatin    Oxycodone-acetaminophen    REACTION: Hallucinations   Quetiapine    REACTION: disorientated   Rosuvastatin    REACTION: Reaction not known      Medication List    TAKE these medications   acetaminophen 500 MG tablet Commonly known as:  TYLENOL Take 500 mg by mouth every 6 (six) hours as needed for fever.   aspirin EC 81 MG tablet Take 81 mg by mouth every morning.   cefpodoxime 200 MG tablet Commonly known as:  VANTIN Take 1 tablet (200 mg total) by mouth 2 (two) times daily.   COQ10 PO Take 100 mg by mouth daily.   folic acid 732 MCG tablet Commonly known as:  FOLVITE Take 400 mcg by mouth daily. 1/2 tab daily   furosemide 40 MG tablet Commonly known as:  LASIX Take 1 tablet (40 mg total) by mouth daily. Daily as needed for swelling What changed:  when to take this  reasons to take this   LUTEIN PO Take 1 tablet by mouth daily.   MAGNESIUM CITRATE PO Take 400 mg by mouth daily.   metFORMIN 1000 MG tablet Commonly known as:  GLUCOPHAGE Take 1,000 mg by mouth 2 (two) times daily.   metoprolol tartrate 100 MG tablet Commonly known as:   LOPRESSOR Take 1 tablet (100 mg total) by mouth 2 (two) times daily.   multivitamin tablet Take 1 tablet by mouth daily.   nitroGLYCERIN 0.4 MG SL tablet Commonly known as:  NITROSTAT Place 1 tablet (0.4 mg total) under the tongue every 5 (five) minutes as needed (up to 3 tablets). What changed:  reasons to take this   pravastatin 40 MG tablet Commonly known as:  PRAVACHOL Take 40 mg by mouth at bedtime.   quinapril 20 MG tablet Commonly known as:  ACCUPRIL TAKE ONE TABLET BY MOUTH ONCE DAILY AT BEDTIME   thiamine 100 MG tablet Commonly known as:  VITAMIN B-1 Take 100 mg by mouth  daily.   vitamin B-12 250 MCG tablet Commonly known as:  CYANOCOBALAMIN Take 250 mcg by mouth daily.   warfarin 5 MG tablet Commonly known as:  COUMADIN Take 1 tablet as directed by coumadin clinic (also takes 7.5 mg tablets) What changed:  how much to take  how to take this  when to take this  additional instructions   warfarin 7.5 MG tablet Commonly known as:  COUMADIN TAKE ONE TABLET BY MOUTH ONCE DAILY AS DIRECTED BY COUMADIN CLINIC What changed:  See the new instructions.      Follow-up Information    Leonard Downing, MD. Schedule an appointment as soon as possible for a visit in 1 week(s).   Specialty:  Family Medicine Why:  Hospital follow up  Contact information: [place] [address] [phone]        Lelon Perla, MD. Schedule an appointment as soon as possible for a visit in 2 week(s).   Specialty:  Cardiology Why:  Follow up  Contact information: [address] [place] [address] [phone]          Allergies  Allergen Reactions  . Atorvastatin   . Oxycodone-Acetaminophen     REACTION: Hallucinations  . Quetiapine     REACTION: disorientated  . Rosuvastatin     REACTION: Reaction not known    Consultations:  None   Procedures/Studies: Dg Chest 2 View  Result Date: 07/21/2016 CLINICAL DATA:   76 year old male with fever. EXAM: CHEST  2 VIEW COMPARISON:  12/03/2009 and prior radiographs FINDINGS: Cardiomegaly and CABG changes again noted. There is no evidence of focal airspace disease, pulmonary edema, suspicious pulmonary nodule/mass, pleural effusion, or pneumothorax. No acute bony abnormalities are identified. IMPRESSION: Cardiomegaly without evidence of acute cardiopulmonary disease. Electronically Signed   By: Margarette Canada M.D.   On: 07/21/2016 20:13    Discharge Exam: Vitals:   07/23/16 0600 07/23/16 1423  BP: (!) 149/79 (!) 140/54  Pulse: 84 (!) 51  Resp: 16 17  Temp: 98.6 F (37 C) 97.5 F (36.4 C)   Vitals:   07/22/16 2134 07/23/16 0042 07/23/16 0600 07/23/16 1423  BP: (!) 182/83 138/62 (!) 149/79 (!) 140/54  Pulse: 67  84 (!) 51  Resp: 19  16 17   Temp: 98.4 F (36.9 C)  98.6 F (37 C) 97.5 F (36.4 C)  TempSrc: Oral  Oral Oral  SpO2: 96%  93% 97%  Weight:      Height:        General: Pt is alert, awake, not in acute distress Cardiovascular: RRR, S1/S2 +, no rubs, no gallops Respiratory: Good air entry, minimal crackles at the bases  Abdominal: Soft, NT, ND, bowel sounds + Extremities: Trace edema on the left Leg, R no edema, no cyanosis   The results of significant diagnostics from this hospitalization (including imaging, microbiology, ancillary and laboratory) are listed below for reference.     Microbiology: Recent Results (from the past 240 hour(s))  Culture, Urine     Status: Abnormal   Collection Time: 07/21/16 12:01 AM  Result Value Ref Range Status   Specimen Description URINE, CLEAN CATCH  Final   Special Requests NONE  Final   Culture <10,000 COLONIES/mL INSIGNIFICANT GROWTH (A)  Final   Report Status 07/23/2016 FINAL  Final  Culture, blood (Routine x 2)     Status: None (Preliminary result)   Collection Time: 07/21/16  7:27 PM  Result Value Ref Range Status  Specimen Description BLOOD RIGHT ANTECUBITAL  Final   Special Requests IN  PEDIATRIC BOTTLE Blood Culture adequate volume  Final   Culture NO GROWTH < 24 HOURS  Final   Report Status PENDING  Incomplete  Culture, blood (Routine x 2)     Status: None (Preliminary result)   Collection Time: 07/21/16  7:46 PM  Result Value Ref Range Status   Specimen Description BLOOD LEFT ARM  Final   Special Requests   Final    BOTTLES DRAWN AEROBIC AND ANAEROBIC Blood Culture adequate volume   Culture NO GROWTH < 24 HOURS  Final   Report Status PENDING  Incomplete     Labs: BNP (last 3 results) No results for input(s): BNP in the last 8760 hours. Basic Metabolic Panel:  Recent Labs Lab 07/21/16 1939 07/22/16 0442 07/23/16 0446  NA 139 139 141  K 4.2 4.1 4.2  CL 108 110 106  CO2 21* 24 28  GLUCOSE 171* 138* 133*  BUN 25* 20 17  CREATININE 1.23 1.18 1.20  CALCIUM 9.4 8.5* 9.0   Liver Function Tests:  Recent Labs Lab 07/21/16 1939  AST 27  ALT 19  ALKPHOS 36*  BILITOT 1.8*  PROT 6.4*  ALBUMIN 3.7   No results for input(s): LIPASE, AMYLASE in the last 168 hours. No results for input(s): AMMONIA in the last 168 hours. CBC:  Recent Labs Lab 07/21/16 1939 07/22/16 0442 07/23/16 0446  WBC 14.2* 7.3 8.4  NEUTROABS 12.5*  --   --   HGB 12.7* 11.2* 12.2*  HCT 39.2 35.1* 38.9*  MCV 101.8* 100.6* 101.3*  PLT 164 121* 164   Cardiac Enzymes: No results for input(s): CKTOTAL, CKMB, CKMBINDEX, TROPONINI in the last 168 hours. BNP: Invalid input(s): POCBNP CBG:  Recent Labs Lab 07/22/16 1638 07/22/16 2138 07/23/16 0802 07/23/16 1153 07/23/16 1638  GLUCAP 136* 131* 134* 145* 240*   D-Dimer No results for input(s): DDIMER in the last 72 hours. Hgb A1c No results for input(s): HGBA1C in the last 72 hours. Lipid Profile No results for input(s): CHOL, HDL, LDLCALC, TRIG, CHOLHDL, LDLDIRECT in the last 72 hours. Thyroid function studies No results for input(s): TSH, T4TOTAL, T3FREE, THYROIDAB in the last 72 hours.  Invalid input(s): FREET3 Anemia  work up No results for input(s): VITAMINB12, FOLATE, FERRITIN, TIBC, IRON, RETICCTPCT in the last 72 hours. Urinalysis    Component Value Date/Time   COLORURINE YELLOW 07/21/2016 1929   APPEARANCEUR HAZY (A) 07/21/2016 1929   LABSPEC 1.027 07/21/2016 1929   PHURINE 5.0 07/21/2016 1929   GLUCOSEU NEGATIVE 07/21/2016 1929   HGBUR MODERATE (A) 07/21/2016 [place] NEGATIVE 07/21/2016 [employer] NEGATIVE 07/21/2016 1929   PROTEINUR 100 (A) 07/21/2016 1929   UROBILINOGEN 1.0 07/31/2009 0949   NITRITE POSITIVE (A) 07/21/2016 1929   LEUKOCYTESUR LARGE (A) 07/21/2016 1929   Sepsis Labs Invalid input(s): PROCALCITONIN,  WBC,  LACTICIDVEN Microbiology Recent Results (from the past 240 hour(s))  Culture, Urine     Status: Abnormal   Collection Time: 07/21/16 12:01 AM  Result Value Ref Range Status   Specimen Description URINE, CLEAN CATCH  Final   Special Requests NONE  Final   Culture <10,000 COLONIES/mL INSIGNIFICANT GROWTH (A)  Final   Report Status 07/23/2016 FINAL  Final  Culture, blood (Routine x 2)     Status: None (Preliminary result)   Collection Time: 07/21/16  7:27 PM  Result Value Ref Range Status   Specimen Description BLOOD RIGHT ANTECUBITAL  Final  Special Requests IN PEDIATRIC BOTTLE Blood Culture adequate volume  Final   Culture NO GROWTH < 24 HOURS  Final   Report Status PENDING  Incomplete  Culture, blood (Routine x 2)     Status: None (Preliminary result)   Collection Time: 07/21/16  7:46 PM  Result Value Ref Range Status   Specimen Description BLOOD LEFT ARM  Final   Special Requests   Final    BOTTLES DRAWN AEROBIC AND ANAEROBIC Blood Culture adequate volume   Culture NO GROWTH < 24 HOURS  Final   Report Status PENDING  Incomplete     Time coordinating discharge: 25 minutes  SIGNED:  Chipper Oman, MD  [facility] 07/23/2016, 4:45 PM  Pager please text page via  [email or web address] Password [email or web address]

## 2016-07-23 NOTE — Progress Notes (Signed)
  Echocardiogram 2D Echocardiogram has been performed.  Jamisyn Langer 07/23/2016, 2:02 PM

## 2016-07-23 NOTE — Plan of Care (Signed)
Problem: Urinary Elimination: Goal: Signs and symptoms of infection will decrease Outcome: Progressing Pt will be free from signs and symptoms of uti prior to discharge.

## 2016-07-26 LAB — CULTURE, BLOOD (ROUTINE X 2)
Culture: NO GROWTH
Culture: NO GROWTH
Special Requests: ADEQUATE
Special Requests: ADEQUATE

## 2016-07-27 ENCOUNTER — Ambulatory Visit (INDEPENDENT_AMBULATORY_CARE_PROVIDER_SITE_OTHER): Payer: Medicare Other | Admitting: *Deleted

## 2016-07-27 DIAGNOSIS — Z7901 Long term (current) use of anticoagulants: Secondary | ICD-10-CM | POA: Diagnosis not present

## 2016-07-27 DIAGNOSIS — I4891 Unspecified atrial fibrillation: Secondary | ICD-10-CM

## 2016-07-27 LAB — POCT INR: INR: 1.4

## 2016-08-02 ENCOUNTER — Encounter: Payer: Self-pay | Admitting: Physician Assistant

## 2016-08-02 ENCOUNTER — Ambulatory Visit (INDEPENDENT_AMBULATORY_CARE_PROVIDER_SITE_OTHER): Payer: Medicare Other | Admitting: Physician Assistant

## 2016-08-02 VITALS — BP 154/78 | HR 44 | Ht 71.0 in | Wt 220.2 lb

## 2016-08-02 DIAGNOSIS — E785 Hyperlipidemia, unspecified: Secondary | ICD-10-CM

## 2016-08-02 DIAGNOSIS — I4821 Permanent atrial fibrillation: Secondary | ICD-10-CM

## 2016-08-02 DIAGNOSIS — Z79899 Other long term (current) drug therapy: Secondary | ICD-10-CM | POA: Diagnosis not present

## 2016-08-02 DIAGNOSIS — E119 Type 2 diabetes mellitus without complications: Secondary | ICD-10-CM | POA: Diagnosis not present

## 2016-08-02 DIAGNOSIS — I5023 Acute on chronic systolic (congestive) heart failure: Secondary | ICD-10-CM | POA: Diagnosis not present

## 2016-08-02 DIAGNOSIS — I10 Essential (primary) hypertension: Secondary | ICD-10-CM | POA: Diagnosis not present

## 2016-08-02 DIAGNOSIS — R001 Bradycardia, unspecified: Secondary | ICD-10-CM

## 2016-08-02 DIAGNOSIS — I482 Chronic atrial fibrillation: Secondary | ICD-10-CM | POA: Diagnosis not present

## 2016-08-02 DIAGNOSIS — I25810 Atherosclerosis of coronary artery bypass graft(s) without angina pectoris: Secondary | ICD-10-CM | POA: Diagnosis not present

## 2016-08-02 MED ORDER — QUINAPRIL HCL 40 MG PO TABS: 40.0000 mg | ORAL_TABLET | Freq: Every day | ORAL | 1 refills | Status: DC

## 2016-08-02 MED ORDER — METOPROLOL TARTRATE 100 MG PO TABS: ORAL_TABLET | ORAL | 3 refills | Status: DC

## 2016-08-02 MED ORDER — METOPROLOL TARTRATE 25 MG PO TABS: ORAL_TABLET | ORAL | 1 refills | Status: DC

## 2016-08-02 NOTE — Progress Notes (Signed)
Cardiology Office Note    Date:  08/02/2016   ID:  Jordan Clayton, Jordan Clayton November 08, 1940, MRN [id number]  PCP:  Leonard Downing, MD  Cardiologist:  Dr. Stanford Breed   Chief Complaint  Patient presents with  . Hospitalization Follow-up    seen for Dr. Stanford Breed, recent admission for UTI sepsis, had volume overload requiring diuresis    History of Present Illness:  Jordan Clayton is a 76 y.o. 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 Cardiac catheterization in 2006 showed 100% LAD, 50% D1, 70% distal left circumflex, 100% OM1, 75% RCA, 99% distal lesion, LIMA patent, 60-70% distal LAD lesion, normal EF. He had PCI of proximal and distal RCA and the first OM. He did not tolerate Crestor and Lipitor previously. Myoview obtained in October 2014 showed inferior defect with no ischemia. He was last seen by Dr. Stanford Breed on 6/31/2017 at which time he was doing well. Most recently, he was admitted to the hospital with back pain and fever. He was treated for UTI with severe sepsis. He did develop file overload symptom after given arch amount of IV fluid initially, this was treated with Lasix prior to discharge. Echocardiogram obtained on 07/23/2016 showed EF 45-50%, diffuse hypokinesis, moderate MR, severely dilated left atrium, moderately dilated right atrium, moderate TR, PA peak pressure 45 mmHg. EF has not changed when compared to the previous echocardiogram in 2011.  Patient presents today for cardiac evaluation. He has chronic right lower extremity swelling on compression stocking. His left lower extremity has 1+ pitting edema. He is currently on as needed dose of Lasix. I have recommended for him to take an one time 40 mg Lasix today. On EKG, he is in atrial fibrillation with slow ventricular rate heart rate 44 bpm. He denies any obvious dizziness. I will lower his metoprolol to 75 mg twice a day to avoid significant bradycardia. At the same time, I  will increase his quinapril to 40 mg daily. He will need a one-week basic metabolic panel. Recently, his INR has been labile, we did discuss her eliquis, he is not interested at this time as a family member had stroke on eliquis. Otherwise he denies any recent chest pain. He has chronic dyspnea on exertion which is unchanged. He can follow-up with Dr. Stanford Breed in 2 month for blood pressure medication titration.    Past Medical History:  Diagnosis Date  . 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 CAD (coronary artery disease)   . Chronic atrial fibrillation ([place])   . Delirium   . DM2 (diabetes mellitus, type 2) ([place])   . Drug therapy    coumadin  . HTN (hypertension)   . Hypernatremia   . Mixed hyperlipidemia   . Septic shock(785.52)     Past Surgical History:  Procedure Laterality Date  . CORONARY ARTERY BYPASS GRAFT  1994   LIMA to LAD per [facility]    Current Medications: Outpatient Medications Prior to Visit  Medication Sig Dispense Refill  . acetaminophen (TYLENOL) 500 MG tablet Take 500 mg by mouth every 6 (six) hours as needed for fever.    Marland Kitchen aspirin EC 81 MG tablet Take 81 mg by mouth every morning.    . Coenzyme Q10 (COQ10 PO) Take 100 mg by mouth daily.    . folic acid (FOLVITE) 003 MCG tablet Take 400 mcg by mouth daily. 1/2 tab daily    . furosemide (LASIX)  40 MG tablet Take 1 tablet (40 mg total) by mouth daily. Daily as needed for swelling 90 tablet 0  . LUTEIN PO Take 1 tablet by mouth daily.    . metFORMIN (GLUCOPHAGE) 1000 MG tablet Take 1,000 mg by mouth 2 (two) times daily.      . Multiple Vitamin (MULTIVITAMIN) tablet Take 1 tablet by mouth daily.      . nitroGLYCERIN (NITROSTAT) 0.4 MG SL tablet Place 1 tablet (0.4 mg total) under the tongue every 5 (five) minutes as needed (up to 3 tablets). (Patient taking differently: Place 0.4 mg under the tongue every 5 (five) minutes as needed for chest pain (up to 3 tablets). ) 25 tablet 6  . pravastatin (PRAVACHOL) 40 MG tablet  Take 40 mg by mouth at bedtime.     . Thiamine HCl (VITAMIN B-1) 100 MG tablet Take 100 mg by mouth daily.      . vitamin B-12 (CYANOCOBALAMIN) 250 MCG tablet Take 250 mcg by mouth daily.      Marland Kitchen warfarin (COUMADIN) 5 MG tablet Take 1 tablet as directed by coumadin clinic (also takes 7.5 mg tablets) (Patient taking differently: Take 5 mg by mouth See admin instructions. Take 1 tablet as directed by coumadin clinic (also takes 7.5 mg tablets) Take 5 mg on Wednesday and Saturday) 30 tablet 0  . warfarin (COUMADIN) 7.5 MG tablet TAKE ONE TABLET BY MOUTH ONCE DAILY AS DIRECTED BY COUMADIN CLINIC (Patient taking differently: TAKE ONE TABLET BY MOUTH ONCE DAILY AS DIRECTED BY COUMADIN CLINIC (MONDAY, TUESDAY, THURSDAY, FRIDAY, AND SUNDAY)) 90 tablet 0  . metoprolol (LOPRESSOR) 100 MG tablet Take 1 tablet (100 mg total) by mouth 2 (two) times daily. 180 tablet 3  . quinapril (ACCUPRIL) 20 MG tablet TAKE ONE TABLET BY MOUTH ONCE DAILY AT BEDTIME 90 tablet 2  . MAGNESIUM CITRATE PO Take 400 mg by mouth daily.     No facility-administered medications prior to visit.      Allergies:   Atorvastatin; Oxycodone-acetaminophen; Quetiapine; and Rosuvastatin   Social History   Social History  . Marital status: Married    Spouse name: N/A  . Number of children: N/A  . Years of education: N/A   Social History Main Topics  . Smoking status: Never Smoker  . Smokeless tobacco: Current User    Types: Chew  . Alcohol use No  . Drug use: No  . Sexual activity: Not Currently   Other Topics Concern  . None   Social History Narrative   Married; retired; disabled, does not get regular exercise.      Family History:  The patient's family history is not on file.   ROS:   Please see the history of present illness.    ROS All other systems reviewed and are negative.   PHYSICAL EXAM:   VS:  BP (!) 154/78 (BP Location: Left Arm, Cuff Size: Normal)   Pulse (!) 44   Ht 5\' 11"  (1.803 m)   Wt 220 lb 3.2 oz  (99.9 kg)   BMI 30.71 kg/m    GEN: Well nourished, well developed, in no acute distress  HEENT: normal  Neck: no JVD, carotid bruits, or masses Cardiac: RRR; no murmurs, rubs, or gallops. Chronic right lower extremity edema on compression stocking, 1+ left ankle edema. Respiratory:  clear to auscultation bilaterally, normal work of breathing. Bibasilar rale GI: soft, nontender, nondistended, + BS MS: no deformity or atrophy  Skin: warm and dry, no rash Neuro:  Alert  and Oriented x 3, Strength and sensation are intact Psych: euthymic mood, full affect  Wt Readings from Last 3 Encounters:  08/02/16 220 lb 3.2 oz (99.9 kg)  07/21/16 224 lb (101.6 kg)  09/25/15 221 lb (100.2 kg)      Studies/Labs Reviewed:   EKG:  EKG is ordered today.  The ekg ordered today demonstrates Atrial fibrillation with slow ventricular rate, heart rate of 44   Recent Labs: 07/21/2016: ALT 19 07/23/2016: BUN 17; Creatinine, Ser 1.20; Hemoglobin 12.2; Platelets 164; Potassium 4.2; Sodium 141   Lipid Panel    Component Value Date/Time   CHOL  09/01/2009 0341    121        ATP III CLASSIFICATION:  <200     mg/dL   Desirable  200-239  mg/dL   Borderline High  >=240    mg/dL   High          TRIG 147 09/01/2009 0341   HDL 25 (L) 09/01/2009 0341   CHOLHDL 4.8 09/01/2009 0341   VLDL 29 09/01/2009 0341   LDLCALC  09/01/2009 0341    67        Total Cholesterol/HDL:CHD Risk Coronary Heart Disease Risk Table                     Men   Women  1/2 Average Risk   3.4   3.3  Average Risk       5.0   4.4  2 X Average Risk   9.6   7.1  3 X Average Risk  23.4   11.0        Use the calculated Patient Ratio above and the CHD Risk Table to determine the patient's CHD Risk.        ATP III CLASSIFICATION (LDL):  <100     mg/dL   Optimal  100-129  mg/dL   Near or Above                    Optimal  130-159  mg/dL   Borderline  160-189  mg/dL   High  >190     mg/dL   Very High    Additional studies/ records  that were reviewed today include:   Echo 07/23/2016 LV EF: 45% -   50%  Study Conclusions  - Left ventricle: The cavity size was normal. There was moderate   concentric hypertrophy. Systolic function was mildly reduced. The   estimated ejection fraction was in the range of 45% to 50%.   Diffuse hypokinesis. The study was not technically sufficient to   allow evaluation of LV diastolic dysfunction due to atrial   fibrillation. - Ventricular septum: The contour showed diastolic flattening and   systolic flattening consistent with right ventricular volume and   pressure overload. - Mitral valve: Transvalvular velocity was within the normal range.   There was no evidence for stenosis. There was moderate   regurgitation. Valve area by pressure half-time: 2.14 cm^2. Valve   area by continuity equation (using LVOT flow): 2.77 cm^2. - Left atrium: The atrium was severely dilated. - Right ventricle: The cavity size was moderately dilated. Wall   thickness was normal. Systolic function was moderately reduced. - Right atrium: The atrium was moderately dilated. - Tricuspid valve: There was moderate regurgitation. - Pulmonic valve: There was trivial regurgitation. - Pulmonary arteries: Systolic pressure was moderately increased.   PA peak pressure: 45 mm Hg (S). - Inferior vena cava: Not visualized. -  Pericardium, extracardiac: There was no pericardial effusion.   ASSESSMENT:    1. Bradycardia   2. Encounter for long-term (current) use of medications   3. Acute on chronic systolic heart failure ([place])   4. Coronary artery disease involving coronary bypass graft of native heart without angina pectoris   5. Essential hypertension   6. Hyperlipidemia, unspecified hyperlipidemia type   7. Controlled type 2 diabetes mellitus without complication, without long-term current use of insulin ([place])   8. Permanent atrial fibrillation ([facility])      PLAN:  In order of problems listed  above:  1. Bradycardia: Largely asymptomatic, will decrease metoprolol to 75 mg twice a day.  2. Permanent atrial fibrillation on Coumadin: Not interested in eliquis at this time as a family member had stroke on eliquis. He is being followed by Coumadin clinic in [facility]  3. Acute on chronic systolic heart failure: He is still volume overloaded was 1+ pitting edema in the left lower extremity. I have asked the patient to take one time dose of as needed Lasix today. We discussed salt and fluid restriction and also daily weight. He has been instructed to take a dose of Lasix if his weight increased by more than 3 pounds overnight or 5 pounds in a single week.  4. CAD s/p CABG: No obvious angina recent echocardiogram reviewed, ejection fraction unchanged compared to the previous echo in 2011.  5. Hypertension: Blood pressure mildly elevated today, however recently has been running in the 130s. Due to bradycardia, I decreased his metoprolol to 75 mg twice a day, will increase quinapril to 40 mg daily. He will need a basic metabolic panel in one week.  6. Hyperlipidemia: On Pravachol 40 mg  7. DM 2: Currently holding metformin, will defer to primary care physician.    Medication Adjustments/Labs and Tests Ordered: Current medicines are reviewed at length with the patient today.  Concerns regarding medicines are outlined above.  Medication changes, Labs and Tests ordered today are listed in the Patient Instructions below. Patient Instructions  Medication Instructions:   INCREASE Quinapril from 20mg  to 40mg  DAILY. A prescription has been sent to the pharmacy.  REDUCE Metoprolol dose from 100mg  TWICE A DAY to 75mg  TWICE A DAY. Cut the 100mg  tablets in half to equal 50mg , and take with the newly prescribed 25mg  dose - this has also been sent to the pharmacy.  Labwork:   BMET in 1 week. You do not need to fast for this test.  Testing/Procedures:  none  Follow-Up:  As scheduled  with Dr. Stanford Breed  If you need a refill on your cardiac medications before your next appointment, please call your pharmacy.      Hilbert Corrigan, [place]  08/02/2016 1:07 PM    [facility] [address], [place], [place]  [address] Phone: [phone]; Fax: [phone]

## 2016-08-02 NOTE — Patient Instructions (Signed)
Medication Instructions:   INCREASE Quinapril from 20mg  to 40mg  DAILY. A prescription has been sent to the pharmacy.  REDUCE Metoprolol dose from 100mg  TWICE A DAY to 75mg  TWICE A DAY. Cut the 100mg  tablets in half to equal 50mg , and take with the newly prescribed 25mg  dose - this has also been sent to the pharmacy.  Labwork:   BMET in 1 week. You do not need to fast for this test.  Testing/Procedures:  none  Follow-Up:  As scheduled with Dr. Stanford Breed  If you need a refill on your cardiac medications before your next appointment, please call your pharmacy.

## 2016-08-06 ENCOUNTER — Ambulatory Visit (INDEPENDENT_AMBULATORY_CARE_PROVIDER_SITE_OTHER): Payer: Medicare Other | Admitting: *Deleted

## 2016-08-06 DIAGNOSIS — Z7901 Long term (current) use of anticoagulants: Secondary | ICD-10-CM

## 2016-08-06 DIAGNOSIS — I4891 Unspecified atrial fibrillation: Secondary | ICD-10-CM | POA: Diagnosis not present

## 2016-08-06 LAB — POCT INR: INR: 3.6

## 2016-08-10 ENCOUNTER — Telehealth: Payer: Self-pay | Admitting: Physician Assistant

## 2016-08-10 LAB — BASIC METABOLIC PANEL
BUN/Creatinine Ratio: 21 (ref 10–24)
BUN: 26 mg/dL (ref 8–27)
CALCIUM: 9.4 mg/dL (ref 8.6–10.2)
CHLORIDE: 106 mmol/L (ref 96–106)
CO2: 24 mmol/L (ref 20–29)
CREATININE: 1.23 mg/dL (ref 0.76–1.27)
GFR, EST AFRICAN AMERICAN: 65 mL/min/{1.73_m2} (ref >59)
GFR, EST NON AFRICAN AMERICAN: 56 mL/min/{1.73_m2} — AB (ref >59)
Glucose: 149 mg/dL — ABNORMAL HIGH (ref 65–99)
Potassium: 5.2 mmol/L (ref 3.5–5.2)
Sodium: 146 mmol/L — ABNORMAL HIGH (ref 134–144)

## 2016-08-10 NOTE — Telephone Encounter (Signed)
Patient wife calling, states that she would like to know if patient's blood work results are available.

## 2016-08-10 NOTE — Telephone Encounter (Signed)
Notes recorded by Almyra Deforest, PA on 08/10/2016 at 8:19 AM EDT Renal function and electrolyte within normal range after increasing quinapril  Provided results to patient's wife - patient is hard of hearting. Routed to PCP per request

## 2016-08-19 ENCOUNTER — Other Ambulatory Visit: Payer: Self-pay | Admitting: Cardiology

## 2016-08-20 ENCOUNTER — Ambulatory Visit (INDEPENDENT_AMBULATORY_CARE_PROVIDER_SITE_OTHER): Payer: Medicare Other | Admitting: *Deleted

## 2016-08-20 DIAGNOSIS — I4891 Unspecified atrial fibrillation: Secondary | ICD-10-CM | POA: Diagnosis not present

## 2016-08-20 DIAGNOSIS — Z7901 Long term (current) use of anticoagulants: Secondary | ICD-10-CM | POA: Diagnosis not present

## 2016-08-20 LAB — POCT INR: INR: 3.2

## 2016-09-03 ENCOUNTER — Ambulatory Visit (INDEPENDENT_AMBULATORY_CARE_PROVIDER_SITE_OTHER): Payer: Medicare Other | Admitting: *Deleted

## 2016-09-03 ENCOUNTER — Encounter (INDEPENDENT_AMBULATORY_CARE_PROVIDER_SITE_OTHER): Payer: Self-pay

## 2016-09-03 DIAGNOSIS — I4891 Unspecified atrial fibrillation: Secondary | ICD-10-CM

## 2016-09-03 DIAGNOSIS — Z7901 Long term (current) use of anticoagulants: Secondary | ICD-10-CM | POA: Diagnosis not present

## 2016-09-03 LAB — POCT INR: INR: 2.8

## 2016-09-24 ENCOUNTER — Ambulatory Visit (INDEPENDENT_AMBULATORY_CARE_PROVIDER_SITE_OTHER): Payer: Medicare Other | Admitting: Pharmacist

## 2016-09-24 DIAGNOSIS — I4891 Unspecified atrial fibrillation: Secondary | ICD-10-CM

## 2016-09-24 DIAGNOSIS — Z7901 Long term (current) use of anticoagulants: Secondary | ICD-10-CM

## 2016-09-24 LAB — POCT INR: INR: 4.4

## 2016-10-04 NOTE — Progress Notes (Deleted)
HPI: FU CAD and atrial fibrillation. Prior CABG with LIMA to LAD. Cath 2006 showed 100 LAD, 50 D1, 70 distal Lcx, 100 first OM, 75 prox RCA and 99 distal; LIMA patent; 60/70 distal LAD, normal EF. Had PCI of prox and distal RCA and first OM. Nuclear study 10/14 showed inferior defect with no ischemia; not gated. Last echocardiogram June 2018 showed ejection fraction 45-50%, moderate mitral regurgitation, biatrial enlargement, moderate right ventricular enlargement with moderately reduced function, moderate tricuspid regurgitation and moderately elevated pulmonary pressure. Patient was seen July 2018 with bradycardia and increased edema. His metoprolol dose was decreased and his ACE inhibitor increased. Since last seen,   Current Outpatient Prescriptions  Medication Sig Dispense Refill  . acetaminophen (TYLENOL) 500 MG tablet Take 500 mg by mouth every 6 (six) hours as needed for fever.    Marland Kitchen aspirin EC 81 MG tablet Take 81 mg by mouth every morning.    . Coenzyme Q10 (COQ10 PO) Take 100 mg by mouth daily.    . folic acid (FOLVITE) 403 MCG tablet Take 400 mcg by mouth daily. 1/2 tab daily    . furosemide (LASIX) 40 MG tablet Take 1 tablet (40 mg total) by mouth daily. Daily as needed for swelling 90 tablet 0  . LUTEIN PO Take 1 tablet by mouth daily.    . metFORMIN (GLUCOPHAGE) 1000 MG tablet Take 1,000 mg by mouth 2 (two) times daily.      . metoprolol tartrate (LOPRESSOR) 100 MG tablet Take 1/2 tablet (50mg ) twice daily along with the 25mg  dose for total of 75mg  twice a day. 180 tablet 3  . metoprolol tartrate (LOPRESSOR) 25 MG tablet Take 1 tablet (25mg ) twice daily along with the 50mg  dose for a total of 75mg  twice a day. 180 tablet 1  . Multiple Vitamin (MULTIVITAMIN) tablet Take 1 tablet by mouth daily.      . nitroGLYCERIN (NITROSTAT) 0.4 MG SL tablet Place 1 tablet (0.4 mg total) under the tongue every 5 (five) minutes as needed (up to 3 tablets). (Patient taking differently: Place 0.4  mg under the tongue every 5 (five) minutes as needed for chest pain (up to 3 tablets). ) 25 tablet 6  . pravastatin (PRAVACHOL) 40 MG tablet Take 40 mg by mouth at bedtime.     . quinapril (ACCUPRIL) 40 MG tablet Take 1 tablet (40 mg total) by mouth daily with breakfast. 90 tablet 1  . Thiamine HCl (VITAMIN B-1) 100 MG tablet Take 100 mg by mouth daily.      . vitamin B-12 (CYANOCOBALAMIN) 250 MCG tablet Take 250 mcg by mouth daily.      Marland Kitchen warfarin (COUMADIN) 5 MG tablet Take 1 tablet (5mg ) every Wednesday and Saturday or as directed by coumadin clinic 30 tablet 0  . warfarin (COUMADIN) 7.5 MG tablet TAKE ONE TABLET BY MOUTH ONCE DAILY AS DIRECTED BY COUMADIN CLINIC (Patient taking differently: TAKE ONE TABLET BY MOUTH ONCE DAILY AS DIRECTED BY COUMADIN CLINIC (MONDAY, TUESDAY, THURSDAY, FRIDAY, AND SUNDAY)) 90 tablet 0   No current facility-administered medications for this visit.      Past Medical History:  Diagnosis Date  . 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 CAD (coronary artery disease)   . Chronic atrial fibrillation ([place])   . Delirium   . DM2 (diabetes mellitus, type 2) ([place])   . Drug therapy    coumadin  . HTN (hypertension)   . Hypernatremia   . Mixed hyperlipidemia   .  Septic shock(785.52)     Past Surgical History:  Procedure Laterality Date  . CORONARY ARTERY BYPASS GRAFT  1994   LIMA to LAD per [facility]    Social History   Social History  . Marital status: Married    Spouse name: N/A  . Number of children: N/A  . Years of education: N/A   Occupational History  . Not on file.   Social History Main Topics  . Smoking status: Never Smoker  . Smokeless tobacco: Current User    Types: Chew  . Alcohol use No  . Drug use: No  . Sexual activity: Not Currently   Other Topics Concern  . Not on file   Social History Narrative   Married; retired; disabled, does not get regular exercise.     Family History  Problem Relation Age of Onset  . Coronary artery  disease Unknown        family hx  . Hypertension Unknown        family hx    ROS: no fevers or chills, productive cough, hemoptysis, dysphasia, odynophagia, melena, hematochezia, dysuria, hematuria, rash, seizure activity, orthopnea, PND, pedal edema, claudication. Remaining systems are negative.  Physical Exam: Well-developed well-nourished in no acute distress.  Skin is warm and dry.  HEENT is normal.  Neck is supple.  Chest is clear to auscultation with normal expansion.  Cardiovascular exam is regular rate and rhythm.  Abdominal exam nontender or distended. No masses palpated. Extremities show no edema. neuro grossly intact  ECG- personally reviewed  A/P  1  Kirk Ruths, MD

## 2016-10-05 ENCOUNTER — Other Ambulatory Visit: Payer: Self-pay | Admitting: Cardiology

## 2016-10-05 ENCOUNTER — Ambulatory Visit (INDEPENDENT_AMBULATORY_CARE_PROVIDER_SITE_OTHER): Payer: Medicare Other

## 2016-10-05 DIAGNOSIS — I4891 Unspecified atrial fibrillation: Secondary | ICD-10-CM | POA: Diagnosis not present

## 2016-10-05 DIAGNOSIS — Z7901 Long term (current) use of anticoagulants: Secondary | ICD-10-CM

## 2016-10-05 LAB — POCT INR: INR: 2.3

## 2016-10-08 ENCOUNTER — Encounter: Payer: Self-pay | Admitting: Cardiology

## 2016-10-08 ENCOUNTER — Ambulatory Visit: Payer: Medicare Other | Admitting: Cardiology

## 2016-10-08 ENCOUNTER — Ambulatory Visit (INDEPENDENT_AMBULATORY_CARE_PROVIDER_SITE_OTHER): Payer: Medicare Other | Admitting: Cardiology

## 2016-10-08 VITALS — BP 110/64 | HR 58 | Ht 71.0 in | Wt 216.0 lb

## 2016-10-08 DIAGNOSIS — I482 Chronic atrial fibrillation: Secondary | ICD-10-CM | POA: Diagnosis not present

## 2016-10-08 DIAGNOSIS — I2510 Atherosclerotic heart disease of native coronary artery without angina pectoris: Secondary | ICD-10-CM | POA: Diagnosis not present

## 2016-10-08 DIAGNOSIS — I4821 Permanent atrial fibrillation: Secondary | ICD-10-CM

## 2016-10-08 DIAGNOSIS — I10 Essential (primary) hypertension: Secondary | ICD-10-CM | POA: Diagnosis not present

## 2016-10-08 DIAGNOSIS — E7800 Pure hypercholesterolemia, unspecified: Secondary | ICD-10-CM

## 2016-10-08 NOTE — Patient Instructions (Signed)
Your physician wants you to follow-up in: ONE YEAR WITH DR CRENSHAW You will receive a reminder letter in the mail two months in advance. If you don't receive a letter, please call our office to schedule the follow-up appointment.   If you need a refill on your cardiac medications before your next appointment, please call your pharmacy.  

## 2016-10-08 NOTE — Progress Notes (Signed)
HPI: FU CAD and atrial fibrillation. Prior CABG with LIMA to LAD. Cath 2006 showed 100 LAD, 50 D1, 70 distal Lcx, 100 first OM, 75 prox RCA and 99 distal; LIMA patent; 60/70 distal LAD, normal EF. Had PCI of prox and distal RCA and first OM. Nuclear study 10/14 showed inferior defect with no ischemia; not gated. Echocardiogram 07/23/2016 showed EF 45-50%, diffuse hypokinesis, moderate MR, severely dilated left atrium, moderately dilated right atrium, moderate TR, PA peak pressure 45 mmHg. Beta blocker decreased at last office visit because of bradycardia. Since last seen, the patient denies any dyspnea on exertion, orthopnea, PND, palpitations, syncope or chest pain. Mild edema RLE.   Current Outpatient Prescriptions  Medication Sig Dispense Refill  . acetaminophen (TYLENOL) 500 MG tablet Take 500 mg by mouth every 6 (six) hours as needed for fever.    Marland Kitchen aspirin EC 81 MG tablet Take 81 mg by mouth every morning.    . Coenzyme Q10 (COQ10 PO) Take 100 mg by mouth daily.    . folic acid (FOLVITE) 416 MCG tablet Take 400 mcg by mouth daily. 1/2 tab daily    . furosemide (LASIX) 40 MG tablet Take 1 tablet (40 mg total) by mouth daily. Daily as needed for swelling 90 tablet 0  . LUTEIN PO Take 1 tablet by mouth daily.    . metFORMIN (GLUCOPHAGE) 1000 MG tablet Take 1,000 mg by mouth 2 (two) times daily.      . metoprolol tartrate (LOPRESSOR) 100 MG tablet Take 1/2 tablet (50mg ) twice daily along with the 25mg  dose for total of 75mg  twice a day. 180 tablet 3  . metoprolol tartrate (LOPRESSOR) 25 MG tablet Take 1 tablet (25mg ) twice daily along with the 50mg  dose for a total of 75mg  twice a day. 180 tablet 1  . Multiple Vitamin (MULTIVITAMIN) tablet Take 1 tablet by mouth daily.      . nitroGLYCERIN (NITROSTAT) 0.4 MG SL tablet Place 1 tablet (0.4 mg total) under the tongue every 5 (five) minutes as needed (up to 3 tablets). (Patient taking differently: Place 0.4 mg under the tongue every 5 (five)  minutes as needed for chest pain (up to 3 tablets). ) 25 tablet 6  . pravastatin (PRAVACHOL) 40 MG tablet Take 40 mg by mouth at bedtime.     . quinapril (ACCUPRIL) 40 MG tablet Take 1 tablet (40 mg total) by mouth daily with breakfast. 90 tablet 1  . Thiamine HCl (VITAMIN B-1) 100 MG tablet Take 100 mg by mouth daily.      . vitamin B-12 (CYANOCOBALAMIN) 250 MCG tablet Take 250 mcg by mouth daily.      Marland Kitchen warfarin (COUMADIN) 5 MG tablet Take 1 tablet (5mg ) every Sunday, Monday, Wednesday and Saturday or as directed by coumadin clinic. 45 tablet 0  . warfarin (COUMADIN) 7.5 MG tablet Take 1 tablet (7.5mg ) every Tuesday, Thursday and Friday or as directed by coumadin clinic. 30 tablet 0   No current facility-administered medications for this visit.      Past Medical History:  Diagnosis Date  . 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 CAD (coronary artery disease)   . Chronic atrial fibrillation ([place])   . Delirium   . DM2 (diabetes mellitus, type 2) ([place])   . Drug therapy    coumadin  . HTN (hypertension)   . Hypernatremia   . Mixed hyperlipidemia   . Septic shock(785.52)     Past Surgical History:  Procedure  Laterality Date  . CORONARY ARTERY BYPASS GRAFT  1994   LIMA to LAD per [facility]    Social History   Social History  . Marital status: Married    Spouse name: N/A  . Number of children: N/A  . Years of education: N/A   Occupational History  . Not on file.   Social History Main Topics  . Smoking status: Never Smoker  . Smokeless tobacco: Current User    Types: Chew  . Alcohol use No  . Drug use: No  . Sexual activity: Not Currently   Other Topics Concern  . Not on file   Social History Narrative   Married; retired; disabled, does not get regular exercise.     Family History  Problem Relation Age of Onset  . Coronary artery disease Unknown        family hx  . Hypertension Unknown        family hx    ROS: no fevers or chills, productive cough, hemoptysis,  dysphasia, odynophagia, melena, hematochezia, dysuria, hematuria, rash, seizure activity, orthopnea, PND, claudication. Remaining systems are negative.  Physical Exam: Well-developed well-nourished in no acute distress.  Skin is warm and dry.  HEENT is normal.  Neck is supple.  Chest is clear to auscultation with normal expansion.  Cardiovascular exam is irregular Abdominal exam nontender or distended. No masses palpated. Extremities show trace to 1+ edema RLE. neuro grossly intact   A/P  1 Permanent atrial fibrillation-patient's rate is controlled. Continue present dose of metoprolol. Continue Coumadin. Not interested in NOACs.  2 coronary artery disease-continue statin. DC aspirin given need for Coumadin.  3 hypertension-blood pressure is controlled. Continue present medications.  4 hyperlipidemia-continue statin. Patient has not tolerated Lipitor or Crestor in the past. Lipids and liver monitored by primary care.  5 chronic combined systolic/diastolic congestive heart failure-patient is euvolemic. Continue present dose of Lasix.  6 moderate mitral regurgitation-patient will need follow-up echocardiograms in the future.   Kirk Ruths, MD

## 2016-10-26 ENCOUNTER — Ambulatory Visit (INDEPENDENT_AMBULATORY_CARE_PROVIDER_SITE_OTHER): Payer: Medicare Other | Admitting: *Deleted

## 2016-10-26 DIAGNOSIS — Z5181 Encounter for therapeutic drug level monitoring: Secondary | ICD-10-CM

## 2016-10-26 DIAGNOSIS — I4891 Unspecified atrial fibrillation: Secondary | ICD-10-CM

## 2016-10-26 DIAGNOSIS — Z7901 Long term (current) use of anticoagulants: Secondary | ICD-10-CM

## 2016-10-26 LAB — POCT INR: INR: 2.5

## 2016-11-23 ENCOUNTER — Ambulatory Visit (INDEPENDENT_AMBULATORY_CARE_PROVIDER_SITE_OTHER): Payer: Medicare Other

## 2016-11-23 DIAGNOSIS — I4891 Unspecified atrial fibrillation: Secondary | ICD-10-CM

## 2016-11-23 DIAGNOSIS — Z7901 Long term (current) use of anticoagulants: Secondary | ICD-10-CM | POA: Diagnosis not present

## 2016-11-23 LAB — POCT INR: INR: 3.3

## 2016-12-21 ENCOUNTER — Ambulatory Visit (INDEPENDENT_AMBULATORY_CARE_PROVIDER_SITE_OTHER): Payer: Medicare Other | Admitting: Pharmacist

## 2016-12-21 DIAGNOSIS — Z7901 Long term (current) use of anticoagulants: Secondary | ICD-10-CM

## 2016-12-21 DIAGNOSIS — I4891 Unspecified atrial fibrillation: Secondary | ICD-10-CM

## 2016-12-21 LAB — POCT INR: INR: 2.6

## 2016-12-21 NOTE — Patient Instructions (Signed)
Continue 5mg  daily except 7.5mg  on Tuesdays, Thursdays and Fridays.  Recheck in 4 weeks.

## 2017-01-10 ENCOUNTER — Other Ambulatory Visit: Payer: Self-pay | Admitting: Cardiology

## 2017-01-23 ENCOUNTER — Other Ambulatory Visit: Payer: Self-pay | Admitting: Physician Assistant

## 2017-01-23 ENCOUNTER — Other Ambulatory Visit: Payer: Self-pay | Admitting: Cardiology

## 2017-01-24 ENCOUNTER — Ambulatory Visit (INDEPENDENT_AMBULATORY_CARE_PROVIDER_SITE_OTHER): Payer: Medicare Other | Admitting: *Deleted

## 2017-01-24 DIAGNOSIS — Z7901 Long term (current) use of anticoagulants: Secondary | ICD-10-CM

## 2017-01-24 DIAGNOSIS — I4891 Unspecified atrial fibrillation: Secondary | ICD-10-CM | POA: Diagnosis not present

## 2017-01-24 LAB — POCT INR: INR: 3.5

## 2017-01-24 NOTE — Patient Instructions (Signed)
Description   Do not take any Coumadin today then continue taking 5mg  daily except 7.5mg  on Tuesdays, Thursdays and Fridays.  Recheck in 3 weeks.

## 2017-01-24 NOTE — Telephone Encounter (Signed)
Please review for refill, Thanks !  

## 2017-02-01 ENCOUNTER — Other Ambulatory Visit: Payer: Self-pay | Admitting: Physician Assistant

## 2017-02-01 NOTE — Telephone Encounter (Signed)
Refill Request.  

## 2017-02-11 ENCOUNTER — Other Ambulatory Visit: Payer: Self-pay | Admitting: Physician Assistant

## 2017-02-11 NOTE — Telephone Encounter (Signed)
Please review for refill. Thanks!  

## 2017-02-16 ENCOUNTER — Ambulatory Visit (INDEPENDENT_AMBULATORY_CARE_PROVIDER_SITE_OTHER): Payer: Medicare Other | Admitting: *Deleted

## 2017-02-16 DIAGNOSIS — Z7901 Long term (current) use of anticoagulants: Secondary | ICD-10-CM

## 2017-02-16 DIAGNOSIS — I4891 Unspecified atrial fibrillation: Secondary | ICD-10-CM

## 2017-02-16 LAB — POCT INR: INR: 3.1

## 2017-02-16 NOTE — Patient Instructions (Signed)
Description   Skip today's dose, then start  taking 5mg  daily except 7.5mg  on Tuesdays and Fridays.  Recheck in 2 weeks.

## 2017-03-02 ENCOUNTER — Encounter (INDEPENDENT_AMBULATORY_CARE_PROVIDER_SITE_OTHER): Payer: Self-pay

## 2017-03-02 ENCOUNTER — Ambulatory Visit: Payer: Medicare Other | Admitting: *Deleted

## 2017-03-02 DIAGNOSIS — I4891 Unspecified atrial fibrillation: Secondary | ICD-10-CM

## 2017-03-02 DIAGNOSIS — Z5181 Encounter for therapeutic drug level monitoring: Secondary | ICD-10-CM

## 2017-03-02 DIAGNOSIS — Z7901 Long term (current) use of anticoagulants: Secondary | ICD-10-CM

## 2017-03-02 LAB — POCT INR: INR: 3.1

## 2017-03-02 NOTE — Patient Instructions (Signed)
Description   Do not take coumadin today Feb 6th then  start  taking 5mg  daily except 7.5mg  only on Tuesdays .  Recheck in 2 weeks.

## 2017-03-22 ENCOUNTER — Ambulatory Visit: Payer: Medicare Other

## 2017-03-22 DIAGNOSIS — Z7901 Long term (current) use of anticoagulants: Secondary | ICD-10-CM

## 2017-03-22 DIAGNOSIS — I4891 Unspecified atrial fibrillation: Secondary | ICD-10-CM

## 2017-03-22 LAB — POCT INR: INR: 3

## 2017-03-22 NOTE — Patient Instructions (Signed)
Description   Continue on same dosage 5mg  daily except 7.5mg  on Tuesdays.  Recheck in 3 weeks.

## 2017-04-07 ENCOUNTER — Other Ambulatory Visit: Payer: Self-pay | Admitting: Cardiology

## 2017-04-12 ENCOUNTER — Ambulatory Visit: Payer: Medicare Other | Admitting: *Deleted

## 2017-04-12 DIAGNOSIS — I4891 Unspecified atrial fibrillation: Secondary | ICD-10-CM

## 2017-04-12 DIAGNOSIS — Z7901 Long term (current) use of anticoagulants: Secondary | ICD-10-CM

## 2017-04-12 LAB — POCT INR: INR: 2.4

## 2017-04-12 NOTE — Patient Instructions (Signed)
Description   Continue on same dosage 5mg  daily except 7.5mg  on Tuesdays.  Recheck in 4 weeks.

## 2017-05-12 ENCOUNTER — Ambulatory Visit: Payer: Medicare Other | Admitting: Pharmacist

## 2017-05-12 DIAGNOSIS — Z7901 Long term (current) use of anticoagulants: Secondary | ICD-10-CM | POA: Diagnosis not present

## 2017-05-12 DIAGNOSIS — I4891 Unspecified atrial fibrillation: Secondary | ICD-10-CM

## 2017-05-12 LAB — POCT INR: INR: 1.8

## 2017-05-12 MED ORDER — WARFARIN SODIUM 5 MG PO TABS: ORAL_TABLET | ORAL | 0 refills | Status: DC

## 2017-05-12 NOTE — Patient Instructions (Signed)
Description   Take one 7.5mg  tablet today then Continue on same dosage 5mg  daily except 7.5mg  on Tuesdays.  Recheck in 3 weeks.

## 2017-06-02 ENCOUNTER — Ambulatory Visit: Payer: Medicare Other | Admitting: *Deleted

## 2017-06-02 DIAGNOSIS — Z7901 Long term (current) use of anticoagulants: Secondary | ICD-10-CM

## 2017-06-02 DIAGNOSIS — Z5181 Encounter for therapeutic drug level monitoring: Secondary | ICD-10-CM | POA: Diagnosis not present

## 2017-06-02 DIAGNOSIS — I4891 Unspecified atrial fibrillation: Secondary | ICD-10-CM

## 2017-06-02 LAB — POCT INR: INR: 1.9

## 2017-06-02 NOTE — Patient Instructions (Signed)
Description   Start today May 9th taking coumadin  5mg  daily except 7.5mg  on Tuesdays and Thursdays.  Recheck in 2 weeks.

## 2017-06-16 ENCOUNTER — Ambulatory Visit: Payer: Medicare Other | Admitting: *Deleted

## 2017-06-16 DIAGNOSIS — Z5181 Encounter for therapeutic drug level monitoring: Secondary | ICD-10-CM | POA: Diagnosis not present

## 2017-06-16 DIAGNOSIS — Z7901 Long term (current) use of anticoagulants: Secondary | ICD-10-CM

## 2017-06-16 DIAGNOSIS — I4891 Unspecified atrial fibrillation: Secondary | ICD-10-CM

## 2017-06-16 LAB — POCT INR: INR: 2 (ref 2.0–3.0)

## 2017-06-16 NOTE — Patient Instructions (Signed)
Description   Continue  taking coumadin  5mg  daily except 7.5mg  on Tuesdays and Thursdays.  Recheck in 3 weeks. Reduce intake of greens to 2 servings weekly

## 2017-06-30 ENCOUNTER — Encounter (HOSPITAL_COMMUNITY): Payer: Self-pay | Admitting: *Deleted

## 2017-06-30 ENCOUNTER — Other Ambulatory Visit: Payer: Self-pay

## 2017-06-30 ENCOUNTER — Emergency Department (HOSPITAL_COMMUNITY)
Admission: EM | Admit: 2017-06-30 | Discharge: 2017-07-01 | Disposition: A | Discharge status: Home / Self Care | Payer: Medicare Other | Attending: Emergency Medicine | Admitting: Emergency Medicine

## 2017-06-30 DIAGNOSIS — Y998 Other external cause status: Secondary | ICD-10-CM | POA: Insufficient documentation

## 2017-06-30 DIAGNOSIS — S0990XA Unspecified injury of head, initial encounter: Secondary | ICD-10-CM | POA: Insufficient documentation

## 2017-06-30 DIAGNOSIS — S51811A Laceration without foreign body of right forearm, initial encounter: Secondary | ICD-10-CM | POA: Insufficient documentation

## 2017-06-30 DIAGNOSIS — W109XXA Fall (on) (from) unspecified stairs and steps, initial encounter: Secondary | ICD-10-CM | POA: Insufficient documentation

## 2017-06-30 DIAGNOSIS — Z79899 Other long term (current) drug therapy: Secondary | ICD-10-CM | POA: Diagnosis not present

## 2017-06-30 DIAGNOSIS — Y929 Unspecified place or not applicable: Secondary | ICD-10-CM | POA: Insufficient documentation

## 2017-06-30 DIAGNOSIS — Z951 Presence of aortocoronary bypass graft: Secondary | ICD-10-CM | POA: Diagnosis not present

## 2017-06-30 DIAGNOSIS — Z7984 Long term (current) use of oral hypoglycemic drugs: Secondary | ICD-10-CM | POA: Diagnosis not present

## 2017-06-30 DIAGNOSIS — Z7982 Long term (current) use of aspirin: Secondary | ICD-10-CM | POA: Insufficient documentation

## 2017-06-30 DIAGNOSIS — I252 Old myocardial infarction: Secondary | ICD-10-CM | POA: Insufficient documentation

## 2017-06-30 DIAGNOSIS — I2510 Atherosclerotic heart disease of native coronary artery without angina pectoris: Secondary | ICD-10-CM | POA: Diagnosis not present

## 2017-06-30 DIAGNOSIS — W19XXXA Unspecified fall, initial encounter: Secondary | ICD-10-CM

## 2017-06-30 DIAGNOSIS — S0181XA Laceration without foreign body of other part of head, initial encounter: Secondary | ICD-10-CM | POA: Diagnosis not present

## 2017-06-30 DIAGNOSIS — I10 Essential (primary) hypertension: Secondary | ICD-10-CM | POA: Insufficient documentation

## 2017-06-30 DIAGNOSIS — Y9389 Activity, other specified: Secondary | ICD-10-CM | POA: Diagnosis not present

## 2017-06-30 DIAGNOSIS — E119 Type 2 diabetes mellitus without complications: Secondary | ICD-10-CM | POA: Diagnosis not present

## 2017-06-30 DIAGNOSIS — Z7901 Long term (current) use of anticoagulants: Secondary | ICD-10-CM | POA: Insufficient documentation

## 2017-06-30 DIAGNOSIS — T148XXA Other injury of unspecified body region, initial encounter: Secondary | ICD-10-CM

## 2017-06-30 NOTE — ED Triage Notes (Signed)
Pt was walking down the steps when he overstepped and fell face forward onto gravel. Lac noted above R eyebrow and skin tears to R forearm. Pt is on Coumadin

## 2017-07-01 ENCOUNTER — Other Ambulatory Visit: Payer: Self-pay

## 2017-07-01 ENCOUNTER — Emergency Department (HOSPITAL_COMMUNITY): Payer: Medicare Other

## 2017-07-01 LAB — BASIC METABOLIC PANEL
Anion gap: 8 (ref 5–15)
BUN: 24 mg/dL — AB (ref 6–20)
CHLORIDE: 109 mmol/L (ref 101–111)
CO2: 25 mmol/L (ref 22–32)
CREATININE: 1.24 mg/dL (ref 0.61–1.24)
Calcium: 9.4 mg/dL (ref 8.9–10.3)
GFR calc Af Amer: >60 mL/min (ref >60)
GFR calc non Af Amer: 54 mL/min — ABNORMAL LOW (ref >60)
GLUCOSE: 163 mg/dL — AB (ref 65–99)
POTASSIUM: 3.8 mmol/L (ref 3.5–5.1)
Sodium: 142 mmol/L (ref 135–145)

## 2017-07-01 LAB — PROTIME-INR
INR: 2.34
Prothrombin Time: 25.4 seconds — ABNORMAL HIGH (ref 11.4–15.2)

## 2017-07-01 LAB — CBC WITH DIFFERENTIAL/PLATELET
ABS IMMATURE GRANULOCYTES: 0.1 10*3/uL (ref 0.0–0.1)
BASOS PCT: 1 %
Basophils Absolute: 0 10*3/uL (ref 0.0–0.1)
EOS PCT: 3 %
Eosinophils Absolute: 0.3 10*3/uL (ref 0.0–0.7)
HCT: 39 % (ref 39.0–52.0)
HEMOGLOBIN: 12.4 g/dL — AB (ref 13.0–17.0)
Immature Granulocytes: 1 %
LYMPHS PCT: 17 %
Lymphs Abs: 1.4 10*3/uL (ref 0.7–4.0)
MCH: 31.8 pg (ref 26.0–34.0)
MCHC: 31.8 g/dL (ref 30.0–36.0)
MCV: 100 fL (ref 78.0–100.0)
MONO ABS: 1.1 10*3/uL — AB (ref 0.1–1.0)
MONOS PCT: 13 %
Neutro Abs: 5.4 10*3/uL (ref 1.7–7.7)
Neutrophils Relative %: 65 %
Platelets: 166 10*3/uL (ref 150–400)
RBC: 3.9 MIL/uL — ABNORMAL LOW (ref 4.22–5.81)
RDW: 14.7 % (ref 11.5–15.5)
WBC: 8.3 10*3/uL (ref 4.0–10.5)

## 2017-07-01 MED ORDER — BACITRACIN ZINC 500 UNIT/GM EX OINT: 1.0000 "application " | TOPICAL_OINTMENT | Freq: Two times a day (BID) | CUTANEOUS | 0 refills | Status: DC

## 2017-07-01 MED ORDER — LIDOCAINE-EPINEPHRINE (PF) 2 %-1:200000 IJ SOLN
20.0000 mL | Freq: Once | INTRAMUSCULAR | Status: DC
  Filled 2017-07-01: qty 20

## 2017-07-01 NOTE — ED Notes (Signed)
Patient transported to CT 

## 2017-07-01 NOTE — Discharge Instructions (Signed)
You do not need to return for suture removal.  However if you notice that the lacerations or wounds are swelling and become red and painful, please return to the emergency department for reevaluation.

## 2017-07-01 NOTE — ED Notes (Signed)
Irrigation performed and Xeroform and gauze applied to wound on right arm and above eye.

## 2017-07-01 NOTE — ED Provider Notes (Signed)
[facility] EMERGENCY DEPARTMENT Provider Note  CSN: [id number] Arrival date & time: 06/30/17 2332  Chief Complaint(s) Fall  HPI Jordan Clayton is a 77 y.o. male with a history of atrial fibrillation on Coumadin presents to the emergency department after mechanical fall while walking down steps.  This resulted in multiple skin tears of the right forearm as well as head trauma with facial lacerations.  Patient denies any loss of consciousness or amnesia to the event.  He denies any neck pain, back pain, chest pain, abdominal pain, hip pain, extremity pain.  Up-to-date on tetanus vaccinations.  Denies any visual disturbance.    HPI  Past Medical History Past Medical History:  Diagnosis Date  . Acute MI ([place])    subendocardial  . CAD (coronary artery disease)   . Chronic atrial fibrillation ([place])   . Delirium   . DM2 (diabetes mellitus, type 2) ([place])   . Drug therapy    coumadin  . HTN (hypertension)   . Hypernatremia   . Mixed hyperlipidemia   . Septic shock(785.52)    Patient Active Problem List   Diagnosis Date Noted  . Sepsis secondary to UTI ([place]) 07/21/2016  . Coronary artery disease 08/31/2011  . Long term (current) use of anticoagulants 12/16/2010  . EDEMA 02/09/2010  . HYPERNATREMIA 09/12/2009  . 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 DELIRIUM 09/12/2009  . SEPTIC SHOCK 09/12/2009  . DM2 (diabetes mellitus, type 2) ([place]) 03/21/2009  . HYPERLIPIDEMIA-MIXED 03/21/2009  . Essential hypertension 03/21/2009  . ATRIAL FIBRILLATION, CHRONIC 03/21/2009   Home Medication(s) Prior to Admission medications   Medication Sig Start Date End Date Taking? Authorizing Provider  acetaminophen (TYLENOL) 500 MG tablet Take 500 mg by mouth every 6 (six) hours as needed for fever.    [provider]  aspirin EC 81 MG tablet Take 81 mg by mouth every morning.    [provider]  bacitracin ointment Apply 1 application topically 2 (two) times  daily. To right arm skin tears 07/01/17   Manessa Buley, Grayce Sessions, MD  Coenzyme Q10 (COQ10 PO) Take 100 mg by mouth daily.    [provider]  folic acid (FOLVITE) 382 MCG tablet Take 400 mcg by mouth daily. 1/2 tab daily    [provider]  furosemide (LASIX) 40 MG tablet Take 1 tablet (40 mg total) by mouth daily. Daily as needed for swelling 07/23/16   Patrecia Pour, Christean Grief, MD  LUTEIN PO Take 1 tablet by mouth daily.    [provider]  metFORMIN (GLUCOPHAGE) 1000 MG tablet Take 1,000 mg by mouth 2 (two) times daily.      [provider]  metoprolol tartrate (LOPRESSOR) 100 MG tablet Take 1/2 tablet (50mg ) twice daily along with the 25mg  dose for total of 75mg  twice a day. 08/02/16   Almyra Deforest, PA  metoprolol tartrate (LOPRESSOR) 100 MG tablet TAKE ONE TABLET BY MOUTH TWICE DAILY 01/24/17   Lelon Perla, MD  metoprolol tartrate (LOPRESSOR) 25 MG tablet TAKE 1 TABLET BY MOUTH TWICE DAILY ALONG WITH THE 50MG  DOSE FOR A TOTAL OF 75MG  TWICE DAILY 01/24/17   Almyra Deforest, PA  metoprolol tartrate (LOPRESSOR) 25 MG tablet TAKE 1 TABLET BY MOUTH TWICE DAILY ALONG WITH THE 50MG  DOSE FOR A TOTAL OF 75MG  TWICE DAILY 02/01/17   Lelon Perla, MD  Multiple Vitamin (MULTIVITAMIN) tablet Take 1 tablet by mouth daily.      [provider]  nitroGLYCERIN (NITROSTAT) 0.4 MG SL tablet  Place 1 tablet (0.4 mg total) under the tongue every 5 (five) minutes as needed (up to 3 tablets). Patient taking differently: Place 0.4 mg under the tongue every 5 (five) minutes as needed for chest pain (up to 3 tablets).  09/26/15   Lelon Perla, MD  pravastatin (PRAVACHOL) 40 MG tablet Take 40 mg by mouth at bedtime.     [provider]  quinapril (ACCUPRIL) 40 MG tablet TAKE 1 TABLET BY MOUTH ONCE DAILY WITH BREAKFAST 02/11/17   Almyra Deforest, PA  Thiamine HCl (VITAMIN B-1) 100 MG tablet Take 100 mg by mouth daily.      [provider]  vitamin B-12 (CYANOCOBALAMIN) 250 MCG  tablet Take 250 mcg by mouth daily.      [provider]  warfarin (COUMADIN) 5 MG tablet TAKE 1 TABLET BY MOUTH ONCE DAILY EVERY  OR  AS  DIRECTED  BY  COUMADIN  CLINIC 05/12/17   Lelon Perla, MD  warfarin (COUMADIN) 7.5 MG tablet TAKE ONE TABLET BY MOUTH EVERY TUESDAY, THURSDAY, AND FRIDAY OR AS DIRECTED BY COUMADIN CLINIC 01/10/17   Lelon Perla, MD                                                                                                                                    Past Surgical History Past Surgical History:  Procedure Laterality Date  . CORONARY ARTERY BYPASS GRAFT  1994   LIMA to LAD per [facility]   Family History Family History  Problem Relation Age of Onset  . Coronary artery disease Unknown        family hx  . Hypertension Unknown        family hx    Social History Social History   Tobacco Use  . Smoking status: Never Smoker  . Smokeless tobacco: Current User    Types: Chew  Substance Use Topics  . Alcohol use: No    Alcohol/week: 0.0 oz  . Drug use: No   Allergies Atorvastatin; Oxycodone-acetaminophen; Quetiapine; and Rosuvastatin  Review of Systems Review of Systems All other systems are reviewed and are negative for acute change except as noted in the HPI  Physical Exam Vital Signs  I have reviewed the triage vital signs BP (!) 156/72 (BP Location: Right Arm)   Pulse 64   Temp 97.8 F (36.6 C) (Oral)   Resp 16   SpO2 98%   Physical Exam  Constitutional: He is oriented to person, place, and time. He appears well-developed and well-nourished. No distress.  HENT:  Head: Normocephalic.    Right Ear: External ear normal.  Left Ear: External ear normal.  Mouth/Throat: Oropharynx is clear and moist.  Eyes: Pupils are equal, round, and reactive to light. Conjunctivae and EOM are normal. Right eye exhibits no discharge. Left eye exhibits no discharge. No scleral icterus.    Neck: Normal range of motion. Neck supple.  Cardiovascular: Regular rhythm and normal heart sounds. Exam reveals no gallop and no friction rub.  No murmur heard. Pulses:      Radial pulses are 2+ on the right side, and 2+ on the left side.       Dorsalis pedis pulses are 2+ on the right side, and 2+ on the left side.  Pulmonary/Chest: Effort normal and breath sounds normal. No stridor. No respiratory distress.  Abdominal: Soft. He exhibits no distension. There is no tenderness.  Musculoskeletal:       Cervical back: He exhibits no bony tenderness.       Thoracic back: He exhibits no bony tenderness.       Lumbar back: He exhibits no bony tenderness.       Arms: Clavicle stable. Chest stable to AP/Lat compression. Pelvis stable to Lat compression. No obvious extremity deformity. No chest or abdominal wall contusion.  Neurological: He is alert and oriented to person, place, and time. GCS eye subscore is 4. GCS verbal subscore is 5. GCS motor subscore is 6.  Moving all extremities   Skin: Skin is warm. He is not diaphoretic.    ED Results and Treatments Labs (all labs ordered are listed, but only abnormal results are displayed) Labs Reviewed  CBC WITH DIFFERENTIAL/PLATELET - Abnormal; Notable for the following components:      Result Value   RBC 3.90 (*)    Hemoglobin 12.4 (*)    Monocytes Absolute 1.1 (*)    All other components within normal limits  BASIC METABOLIC PANEL - Abnormal; Notable for the following components:   Glucose, Bld 163 (*)    BUN 24 (*)    GFR calc non Af Amer 54 (*)    All other components within normal limits  PROTIME-INR - Abnormal; Notable for the following components:   Prothrombin Time 25.4 (*)    All other components within normal limits                                                                                                                         EKG  EKG Interpretation  Date/Time:    Ventricular Rate:    PR Interval:    QRS Duration:   QT Interval:    QTC Calculation:   R  Axis:     Text Interpretation:        Radiology Dg Forearm Right  Result Date: 07/01/2017 CLINICAL DATA:  Post fall with skin tears to the right forearm. EXAM: RIGHT FOREARM - 2 VIEW COMPARISON:  None. FINDINGS: The cortical margins of the radius and ulna are intact. Proximal olecranon is not entirely included in the field of view on the lateral. No evidence of fracture. Skin irregularity with coarse density overlying the soft tissues may reflect debris or an overlying dressing. There are vascular calcifications. IMPRESSION: 1. No fracture of the forearm. 2. Skin irregularity and ill-defined density overlying the soft tissues may be radiopaque debris or overlying dressing in place. Electronically  Signed   By: Jeb Levering M.D.   On: 07/01/2017 01:14   Ct Head Wo Contrast  Result Date: 07/01/2017 CLINICAL DATA:  Fall walking down stairs face first. Laceration above right eye. On anticoagulation. EXAM: CT HEAD WITHOUT CONTRAST CT MAXILLOFACIAL WITHOUT CONTRAST CT CERVICAL SPINE WITHOUT CONTRAST TECHNIQUE: Multidetector CT imaging of the head, cervical spine, and maxillofacial structures were performed using the standard protocol without intravenous contrast. Multiplanar CT image reconstructions of the cervical spine and maxillofacial structures were also generated. COMPARISON:  None. FINDINGS: CT HEAD FINDINGS Brain: Age related atrophy. Mild to moderate chronic small vessel ischemia. Remote right caudal thalamic lacunar infarct. No intracranial hemorrhage, mass effect, or midline shift. No hydrocephalus. The basilar cisterns are patent. No evidence of territorial infarct or acute ischemia. No extra-axial or intracranial fluid collection. Vascular: Atherosclerosis of skullbase vasculature without hyperdense vessel or abnormal calcification. Skull: No fracture or focal lesion. Other: None. CT MAXILLOFACIAL FINDINGS Osseous: Zygomatic arches, nasal bone, and mandibles are intact. Temporomandibular joints  are congruent. Patient is edentulous. Orbits: Both orbits and globes are intact.  No orbital fracture. Sinuses: Mucosal thickening of the maxillary sinuses, increased density on the left indicate chronicity. No sinus fracture. Mastoid air cells are clear. Soft tissues: No confluent contusion.  No radiopaque foreign body. CT CERVICAL SPINE FINDINGS Alignment: Straightening of normal lordosis. No traumatic subluxation. Skull base and vertebrae: No acute fracture. Vertebral body heights are maintained. The dens and skull base are intact. Sclerotic endplate changes at M8-U1 and C6-C7. Soft tissues and spinal canal: No prevertebral fluid or swelling. No visible canal hematoma. Disc levels: Disc space narrowing and endplate spurring L2-G4 and C6-C7. Scattered facet arthropathy. Upper chest: Prominent sized thyroid gland without dominant nodule. 11 mm right upper paratracheal node is nonspecific but likely reactive. Other: Carotid calcifications. IMPRESSION: 1.  No acute intracranial abnormality.  No skull fracture. 2. Generalized atrophy and chronic small vessel ischemia. 3. No facial bone fracture.  Chronic maxillary sinus disease. 4. Degenerative change in the cervical spine without acute fracture or subluxation. Electronically Signed   By: Jeb Levering M.D.   On: 07/01/2017 02:27   Ct Cervical Spine Wo Contrast  Result Date: 07/01/2017 CLINICAL DATA:  Fall walking down stairs face first. Laceration above right eye. On anticoagulation. EXAM: CT HEAD WITHOUT CONTRAST CT MAXILLOFACIAL WITHOUT CONTRAST CT CERVICAL SPINE WITHOUT CONTRAST TECHNIQUE: Multidetector CT imaging of the head, cervical spine, and maxillofacial structures were performed using the standard protocol without intravenous contrast. Multiplanar CT image reconstructions of the cervical spine and maxillofacial structures were also generated. COMPARISON:  None. FINDINGS: CT HEAD FINDINGS Brain: Age related atrophy. Mild to moderate chronic small  vessel ischemia. Remote right caudal thalamic lacunar infarct. No intracranial hemorrhage, mass effect, or midline shift. No hydrocephalus. The basilar cisterns are patent. No evidence of territorial infarct or acute ischemia. No extra-axial or intracranial fluid collection. Vascular: Atherosclerosis of skullbase vasculature without hyperdense vessel or abnormal calcification. Skull: No fracture or focal lesion. Other: None. CT MAXILLOFACIAL FINDINGS Osseous: Zygomatic arches, nasal bone, and mandibles are intact. Temporomandibular joints are congruent. Patient is edentulous. Orbits: Both orbits and globes are intact.  No orbital fracture. Sinuses: Mucosal thickening of the maxillary sinuses, increased density on the left indicate chronicity. No sinus fracture. Mastoid air cells are clear. Soft tissues: No confluent contusion.  No radiopaque foreign body. CT CERVICAL SPINE FINDINGS Alignment: Straightening of normal lordosis. No traumatic subluxation. Skull base and vertebrae: No acute fracture. Vertebral body heights are maintained.  The dens and skull base are intact. Sclerotic endplate changes at O2-V0 and C6-C7. Soft tissues and spinal canal: No prevertebral fluid or swelling. No visible canal hematoma. Disc levels: Disc space narrowing and endplate spurring J5-K0 and C6-C7. Scattered facet arthropathy. Upper chest: Prominent sized thyroid gland without dominant nodule. 11 mm right upper paratracheal node is nonspecific but likely reactive. Other: Carotid calcifications. IMPRESSION: 1.  No acute intracranial abnormality.  No skull fracture. 2. Generalized atrophy and chronic small vessel ischemia. 3. No facial bone fracture.  Chronic maxillary sinus disease. 4. Degenerative change in the cervical spine without acute fracture or subluxation. Electronically Signed   By: Jeb Levering M.D.   On: 07/01/2017 02:27   Ct Maxillofacial Wo Contrast  Result Date: 07/01/2017 CLINICAL DATA:  Fall walking down stairs  face first. Laceration above right eye. On anticoagulation. EXAM: CT HEAD WITHOUT CONTRAST CT MAXILLOFACIAL WITHOUT CONTRAST CT CERVICAL SPINE WITHOUT CONTRAST TECHNIQUE: Multidetector CT imaging of the head, cervical spine, and maxillofacial structures were performed using the standard protocol without intravenous contrast. Multiplanar CT image reconstructions of the cervical spine and maxillofacial structures were also generated. COMPARISON:  None. FINDINGS: CT HEAD FINDINGS Brain: Age related atrophy. Mild to moderate chronic small vessel ischemia. Remote right caudal thalamic lacunar infarct. No intracranial hemorrhage, mass effect, or midline shift. No hydrocephalus. The basilar cisterns are patent. No evidence of territorial infarct or acute ischemia. No extra-axial or intracranial fluid collection. Vascular: Atherosclerosis of skullbase vasculature without hyperdense vessel or abnormal calcification. Skull: No fracture or focal lesion. Other: None. CT MAXILLOFACIAL FINDINGS Osseous: Zygomatic arches, nasal bone, and mandibles are intact. Temporomandibular joints are congruent. Patient is edentulous. Orbits: Both orbits and globes are intact.  No orbital fracture. Sinuses: Mucosal thickening of the maxillary sinuses, increased density on the left indicate chronicity. No sinus fracture. Mastoid air cells are clear. Soft tissues: No confluent contusion.  No radiopaque foreign body. CT CERVICAL SPINE FINDINGS Alignment: Straightening of normal lordosis. No traumatic subluxation. Skull base and vertebrae: No acute fracture. Vertebral body heights are maintained. The dens and skull base are intact. Sclerotic endplate changes at X3-G1 and C6-C7. Soft tissues and spinal canal: No prevertebral fluid or swelling. No visible canal hematoma. Disc levels: Disc space narrowing and endplate spurring W2-X9 and C6-C7. Scattered facet arthropathy. Upper chest: Prominent sized thyroid gland without dominant nodule. 11 mm right  upper paratracheal node is nonspecific but likely reactive. Other: Carotid calcifications. IMPRESSION: 1.  No acute intracranial abnormality.  No skull fracture. 2. Generalized atrophy and chronic small vessel ischemia. 3. No facial bone fracture.  Chronic maxillary sinus disease. 4. Degenerative change in the cervical spine without acute fracture or subluxation. Electronically Signed   By: Jeb Levering M.D.   On: 07/01/2017 02:27   Pertinent labs & imaging results that were available during my care of the patient were reviewed by me and considered in my medical decision making (see chart for details).  Medications Ordered in ED Medications  lidocaine-EPINEPHrine (XYLOCAINE W/EPI) 2 %-1:200000 (PF) injection 20 mL (has no administration in time range)  Procedures .Marland KitchenLaceration Repair Date/Time: 07/01/2017 4:52 AM Performed by: Fatima Blank, MD Authorized by: Fatima Blank, MD   Consent:    Consent obtained:  Verbal   Consent given by:  Patient   Risks discussed:  Infection, pain and poor cosmetic result   Alternatives discussed:  Delayed treatment Anesthesia (see MAR for exact dosages):    Anesthesia method:  Local infiltration   Local anesthetic:  Lidocaine 1% WITH epi Laceration details:    Location:  Face   Face location:  Forehead   Length (cm):  1.2   Depth (mm):  5 Repair type:    Repair type:  Intermediate Pre-procedure details:    Preparation:  Patient was prepped and draped in usual sterile fashion and imaging obtained to evaluate for foreign bodies Exploration:    Hemostasis achieved with:  Direct pressure   Wound extent: no foreign bodies/material noted, no muscle damage noted, no nerve damage noted, no tendon damage noted, no underlying fracture noted and no vascular damage noted     Contaminated: yes   Treatment:    Area  cleansed with:  Betadine   Amount of cleaning:  Extensive   Irrigation solution:  Sterile saline   Irrigation volume:  750   Irrigation method:  Syringe   Visualized foreign bodies/material removed: no   Subcutaneous repair:    Suture size:  5-0   Suture material:  Vicryl   Suture technique:  Simple interrupted   Number of sutures:  1 Skin repair:    Repair method:  Sutures   Suture size:  5-0   Suture material:  Fast-absorbing gut   Suture technique:  Running locked   Number of sutures:  6 Approximation:    Approximation:  Close Post-procedure details:    Dressing:  Antibiotic ointment and non-adherent dressing   Patient tolerance of procedure:  Tolerated well, no immediate complications .Marland KitchenLaceration Repair Date/Time: 07/01/2017 5:05 AM Performed by: Fatima Blank, MD Authorized by: Fatima Blank, MD   Laceration details:    Length (cm):  0.3 Repair type:    Repair type:  Simple Pre-procedure details:    Preparation:  Patient was prepped and draped in usual sterile fashion and imaging obtained to evaluate for foreign bodies Exploration:    Hemostasis achieved with:  Direct pressure Treatment:    Area cleansed with:  Betadine   Amount of cleaning:  Standard   Irrigation solution:  Sterile water   Irrigation volume:  250   Irrigation method:  Syringe   Visualized foreign bodies/material removed: no   Skin repair:    Repair method:  Sutures   Suture size:  5-0   Suture material:  Fast-absorbing gut   Suture technique:  Simple interrupted   Number of sutures:  1 Approximation:    Approximation:  Close Post-procedure details:    Dressing:  Antibiotic ointment   Patient tolerance of procedure:  Tolerated well, no immediate complications     (including critical care time)  Medical Decision Making / ED Course I have reviewed the nursing notes for this encounter and the patient's prior records (if available in EHR or on provided paperwork).      Mechanical fall resulting in facial trauma and skin tears to right forearm.  Patient is anticoagulated.  INR is therapeutic. CT of the head, face, and neck without evidence of ICH or acute fracture.  Plain film of the right forearm without evidence of acute fracture.  Lacerations to the face thoroughly irrigated and  closed as above.  Skin tears to right upper extremity with thoroughly irrigated. A avulsed tissue was reexpanded  to provide bile covering.  Nonstick antibiotic mesh was applied above and bandaged.  Patient was able to ambulate without complication.  Final Clinical Impression(s) / ED Diagnoses Final diagnoses:  Fall, initial encounter  Facial laceration, initial encounter  Multiple skin tears   Disposition: Discharge  Condition: Good  I have discussed the results, Dx and Tx plan with the patient and wife who expressed understanding and agree(s) with the plan. Discharge instructions discussed at great length. The patient and wife were given strict return precautions who verbalized understanding of the instructions. No further questions at time of discharge.    ED Discharge Orders        Ordered    bacitracin ointment  2 times daily     07/01/17 0981       Follow Up: Leonard Downing, MD [place] [id number] [phone]  Schedule an appointment as soon as possible for a visit  As needed      This chart was dictated using voice recognition software.  Despite best efforts to proofread,  errors can occur which can change the documentation meaning.   Fatima Blank, MD 07/01/17 [phone]

## 2017-07-07 ENCOUNTER — Ambulatory Visit: Payer: Medicare Other | Admitting: *Deleted

## 2017-07-07 DIAGNOSIS — I4891 Unspecified atrial fibrillation: Secondary | ICD-10-CM | POA: Diagnosis not present

## 2017-07-07 DIAGNOSIS — Z7901 Long term (current) use of anticoagulants: Secondary | ICD-10-CM

## 2017-07-07 LAB — POCT INR: INR: 2.5 (ref 2.0–3.0)

## 2017-07-07 NOTE — Patient Instructions (Signed)
Description   Continue  taking coumadin  5mg  daily except 7.5mg  on Tuesdays and Thursdays.  Recheck in 4 weeks. Reduce intake of greens to 2 servings weekly

## 2017-08-04 ENCOUNTER — Ambulatory Visit: Payer: Medicare Other | Admitting: Pharmacist

## 2017-08-04 DIAGNOSIS — I4891 Unspecified atrial fibrillation: Secondary | ICD-10-CM | POA: Diagnosis not present

## 2017-08-04 DIAGNOSIS — Z7901 Long term (current) use of anticoagulants: Secondary | ICD-10-CM | POA: Diagnosis not present

## 2017-08-04 LAB — POCT INR: INR: 2.9 (ref 2.0–3.0)

## 2017-08-04 NOTE — Patient Instructions (Signed)
Description   Continue taking coumadin 5mg daily except 7.5mg on Tuesdays and Thursdays.  Recheck in 6 weeks.     

## 2017-09-07 ENCOUNTER — Other Ambulatory Visit: Payer: Self-pay | Admitting: Cardiology

## 2017-09-15 ENCOUNTER — Ambulatory Visit: Payer: Medicare Other | Admitting: *Deleted

## 2017-09-15 DIAGNOSIS — Z7901 Long term (current) use of anticoagulants: Secondary | ICD-10-CM | POA: Diagnosis not present

## 2017-09-15 DIAGNOSIS — I4891 Unspecified atrial fibrillation: Secondary | ICD-10-CM | POA: Diagnosis not present

## 2017-09-15 LAB — POCT INR: INR: 2.7 (ref 2.0–3.0)

## 2017-09-15 NOTE — Patient Instructions (Signed)
Description   Continue taking coumadin 5mg daily except 7.5mg on Tuesdays and Thursdays.  Recheck in 6 weeks.     

## 2017-10-25 NOTE — Progress Notes (Signed)
HPI: FU CAD and atrial fibrillation. Prior CABG with LIMA to LAD. Cath 2006 showed 100 LAD, 50 D1, 70 distal Lcx, 100 first OM, 75 prox RCA and 99 distal; LIMA patent; 60/70 distal LAD, normal EF. Had PCI of prox and distal RCA and first OM. Nuclear study 10/14 showed inferior defect with no ischemia; not gated. Echocardiogram 07/23/2016 showed EF 45-50%, diffuse hypokinesis, moderate MR, severely dilated left atrium, moderately dilated right atrium, moderate TR, PA peak pressure 45 mmHg. Beta blocker decreased at last office visit because of bradycardia. Since last seen,  patient denies dyspnea, chest pain, palpitations or syncope.  No bleeding.  Current Outpatient Medications  Medication Sig Dispense Refill  . acetaminophen (TYLENOL) 500 MG tablet Take 500 mg by mouth every 6 (six) hours as needed for fever.    Marland Kitchen aspirin EC 81 MG tablet Take 81 mg by mouth every morning.    . bacitracin ointment Apply 1 application topically 2 (two) times daily. To right arm skin tears 425 g 0  . Coenzyme Q10 (COQ10 PO) Take 100 mg by mouth daily.    . folic acid (FOLVITE) 992 MCG tablet Take 400 mcg by mouth daily. 1/2 tab daily    . furosemide (LASIX) 40 MG tablet Take 1 tablet (40 mg total) by mouth daily. Daily as needed for swelling 90 tablet 0  . LUTEIN PO Take 1 tablet by mouth daily.    . metFORMIN (GLUCOPHAGE) 1000 MG tablet Take 1,000 mg by mouth 2 (two) times daily.      . metoprolol tartrate (LOPRESSOR) 100 MG tablet TAKE ONE TABLET BY MOUTH TWICE DAILY (Patient taking differently: 50 mg. ) 180 tablet 3  . metoprolol tartrate (LOPRESSOR) 25 MG tablet TAKE 1 TABLET BY MOUTH TWICE DAILY ALONG WITH THE 50MG  DOSE FOR A TOTAL OF 75MG  TWICE DAILY 180 tablet 0  . Multiple Vitamin (MULTIVITAMIN) tablet Take 1 tablet by mouth daily.      . nitroGLYCERIN (NITROSTAT) 0.4 MG SL tablet Place 1 tablet (0.4 mg total) under the tongue every 5 (five) minutes as needed (up to 3 tablets). (Patient taking  differently: Place 0.4 mg under the tongue every 5 (five) minutes as needed for chest pain (up to 3 tablets). ) 25 tablet 6  . pravastatin (PRAVACHOL) 40 MG tablet Take 40 mg by mouth at bedtime.     . quinapril (ACCUPRIL) 40 MG tablet TAKE 1 TABLET BY MOUTH ONCE DAILY WITH BREAKFAST 90 tablet 2  . Thiamine HCl (VITAMIN B-1) 100 MG tablet Take 100 mg by mouth daily.      . vitamin B-12 (CYANOCOBALAMIN) 250 MCG tablet Take 250 mcg by mouth daily.      Marland Kitchen warfarin (COUMADIN) 5 MG tablet TAKE 1 TABLET BY MOUTH ONCE DAILY EVERY  OR  AS  DIRECTED  BY  COUMADIN  CLINIC 90 tablet 0  . warfarin (COUMADIN) 7.5 MG tablet TAKE 1 TABLET BY MOUTH EVERY TUESDAY, THURSDAY, AND FRIDAY OR AS DIRECTED BY COUMADIN CLINIC 30 tablet 1   No current facility-administered medications for this visit.      Past Medical History:  Diagnosis Date  . Acute MI ([place])    subendocardial  . CAD (coronary artery disease)   . Chronic atrial fibrillation   . Delirium   . DM2 (diabetes mellitus, type 2) ([place])   . Drug therapy    coumadin  . HTN (hypertension)   . Hypernatremia   . Mixed hyperlipidemia   .  Septic shock(785.52)     Past Surgical History:  Procedure Laterality Date  . CORONARY ARTERY BYPASS GRAFT  1994   LIMA to LAD per [facility]    Social History   Socioeconomic History  . Marital status: Married    Spouse name: Not on file  . Number of children: Not on file  . Years of education: Not on file  . Highest education level: Not on file  Occupational History  . Not on file  Social Needs  . Financial resource strain: Not on file  . Food insecurity:    Worry: Not on file    Inability: Not on file  . Transportation needs:    Medical: Not on file    Non-medical: Not on file  Tobacco Use  . Smoking status: Never Smoker  . Smokeless tobacco: Current User    Types: Chew  Substance and Sexual Activity  . Alcohol use: No    Alcohol/week: 0.0 standard drinks  . Drug use: No  . Sexual activity: Not  Currently  Lifestyle  . Physical activity:    Days per week: Not on file    Minutes per session: Not on file  . Stress: Not on file  Relationships  . Social connections:    Talks on phone: Not on file    Gets together: Not on file    Attends religious service: Not on file    Active member of club or organization: Not on file    Attends meetings of clubs or organizations: Not on file    Relationship status: Not on file  . Intimate partner violence:    Fear of current or ex partner: Not on file    Emotionally abused: Not on file    Physically abused: Not on file    Forced sexual activity: Not on file  Other Topics Concern  . Not on file  Social History Narrative   Married; retired; disabled, does not get regular exercise.     Family History  Problem Relation Age of Onset  . Coronary artery disease Unknown        family hx  . Hypertension Unknown        family hx    ROS: no fevers or chills, productive cough, hemoptysis, dysphasia, odynophagia, melena, hematochezia, dysuria, hematuria, rash, seizure activity, orthopnea, PND, pedal edema, claudication. Remaining systems are negative.  Physical Exam: Well-developed well-nourished in no acute distress.  Skin is warm and dry.  HEENT is normal.  Neck is supple.  Chest is clear to auscultation with normal expansion.  Cardiovascular exam is irregular Abdominal exam nontender or distended. No masses palpated. Extremities show 1+ edema right lower extremity neuro grossly intact  ECG-atrial fibrillation at a rate of 49.  Cannot rule out prior septal infarct.  Personally reviewed  A/P  1 permanent atrial fibrillation-patient's heart rate is decreased.  Decrease metoprolol to 50 mg twice daily.  Continue Coumadin.  He does not want DOACS.  2 coronary artery disease-plan to continue medical therapy.  Continue statin.  Would discontinue aspirin given need for Coumadin.  3 moderate mitral regurgitation-plan follow-up  echocardiogram.  4 hyperlipidemia-continue statin.  He did not tolerate Lipitor and Crestor in the past.  5 hypertension-blood pressure is controlled.  Continue present medications.  6 chronic combined systolic/diastolic congestive heart failure-he appears to be euvolemic.  Continue Lasix as needed.  Continue fluid restriction and low-sodium diet.  Kirk Ruths, MD

## 2017-10-26 ENCOUNTER — Other Ambulatory Visit: Payer: Self-pay | Admitting: Cardiology

## 2017-10-27 ENCOUNTER — Ambulatory Visit: Payer: Medicare Other | Admitting: *Deleted

## 2017-10-27 DIAGNOSIS — I4891 Unspecified atrial fibrillation: Secondary | ICD-10-CM

## 2017-10-27 DIAGNOSIS — Z7901 Long term (current) use of anticoagulants: Secondary | ICD-10-CM

## 2017-10-27 LAB — POCT INR: INR: 1.9 — AB (ref 2.0–3.0)

## 2017-10-27 MED ORDER — WARFARIN SODIUM 5 MG PO TABS: ORAL_TABLET | ORAL | 0 refills | Status: DC

## 2017-10-27 NOTE — Patient Instructions (Signed)
Description   Today take 11.25mg  (1.5 tablets of a 7.5mg  tab) then continue taking coumadin 5mg  daily except 7.5mg  on Tuesdays and Thursdays.  Recheck in 5 weeks.

## 2017-10-31 ENCOUNTER — Ambulatory Visit: Payer: Medicare Other | Admitting: Cardiology

## 2017-10-31 ENCOUNTER — Encounter: Payer: Self-pay | Admitting: Cardiology

## 2017-10-31 VITALS — BP 118/80 | HR 49 | Ht 70.0 in | Wt 208.0 lb

## 2017-10-31 DIAGNOSIS — I059 Rheumatic mitral valve disease, unspecified: Secondary | ICD-10-CM

## 2017-10-31 DIAGNOSIS — E7800 Pure hypercholesterolemia, unspecified: Secondary | ICD-10-CM | POA: Diagnosis not present

## 2017-10-31 DIAGNOSIS — I4821 Permanent atrial fibrillation: Secondary | ICD-10-CM | POA: Diagnosis not present

## 2017-10-31 DIAGNOSIS — I2510 Atherosclerotic heart disease of native coronary artery without angina pectoris: Secondary | ICD-10-CM

## 2017-10-31 DIAGNOSIS — I10 Essential (primary) hypertension: Secondary | ICD-10-CM | POA: Diagnosis not present

## 2017-10-31 MED ORDER — METOPROLOL TARTRATE 50 MG PO TABS: 50.0000 mg | ORAL_TABLET | Freq: Two times a day (BID) | ORAL | 3 refills | Status: DC

## 2017-10-31 NOTE — Patient Instructions (Signed)
Medication Instructions:  STOP ASPIRIN  DECREASE METOPROLOL TO 50 MG TWICE DAILY If you need a refill on your cardiac medications before your next appointment, please call your pharmacy.   Lab work: If you have labs (blood work) drawn today and your tests are completely normal, you will receive your results only by: Marland Kitchen MyChart Message (if you have MyChart) OR . A paper copy in the mail If you have any lab test that is abnormal or we need to change your treatment, we will call you to review the results.  Testing/Procedures: Your physician has requested that you have an echocardiogram. Echocardiography is a painless test that uses sound waves to create images of your heart. It provides your doctor with information about the size and shape of your heart and how well your heart's chambers and valves are working. This procedure takes approximately one hour. There are no restrictions for this procedure.    Follow-Up: At [facility], you and your health needs are our priority.  As part of our continuing mission to provide you with exceptional heart care, we have created designated Provider Care Teams.  These Care Teams include your primary Cardiologist (physician) and Advanced Practice Providers (APPs -  Physician Assistants and Nurse Practitioners) who all work together to provide you with the care you need, when you need it. You will need a follow up appointment in 12 months.  Please call our office 2 months in advance to schedule this appointment.  You may see Kirk Ruths MD or one of the following Advanced Practice Providers on your designated Care Team:   Kerin Ransom, PA-C Roby Lofts, [place] . Sande Rives, PA-C

## 2017-11-04 ENCOUNTER — Other Ambulatory Visit: Payer: Self-pay | Admitting: Physician Assistant

## 2017-11-09 ENCOUNTER — Other Ambulatory Visit: Payer: Self-pay

## 2017-11-09 ENCOUNTER — Ambulatory Visit (HOSPITAL_COMMUNITY): Payer: Medicare Other | Attending: Cardiology

## 2017-11-09 DIAGNOSIS — I059 Rheumatic mitral valve disease, unspecified: Secondary | ICD-10-CM | POA: Diagnosis not present

## 2017-11-11 ENCOUNTER — Encounter: Payer: Self-pay | Admitting: Cardiology

## 2017-11-11 NOTE — Telephone Encounter (Signed)
This encounter was created in error - please disregard.

## 2017-11-11 NOTE — Telephone Encounter (Signed)
New Message    Patients wife Vaughan Basta is calling to obtain echocardiogram results.

## 2017-12-01 ENCOUNTER — Ambulatory Visit: Payer: Medicare Other

## 2017-12-01 DIAGNOSIS — Z7901 Long term (current) use of anticoagulants: Secondary | ICD-10-CM | POA: Diagnosis not present

## 2017-12-01 DIAGNOSIS — I4891 Unspecified atrial fibrillation: Secondary | ICD-10-CM | POA: Diagnosis not present

## 2017-12-01 LAB — POCT INR: INR: 2.2 (ref 2.0–3.0)

## 2017-12-01 NOTE — Patient Instructions (Signed)
Description   Continue taking coumadin 5mg  daily except 7.5mg  on Tuesdays and Thursdays.  Recheck in 6 weeks.

## 2018-01-12 ENCOUNTER — Ambulatory Visit: Payer: Medicare Other | Admitting: Pharmacist

## 2018-01-12 ENCOUNTER — Encounter (INDEPENDENT_AMBULATORY_CARE_PROVIDER_SITE_OTHER): Payer: Self-pay

## 2018-01-12 DIAGNOSIS — I4891 Unspecified atrial fibrillation: Secondary | ICD-10-CM | POA: Diagnosis not present

## 2018-01-12 DIAGNOSIS — Z7901 Long term (current) use of anticoagulants: Secondary | ICD-10-CM | POA: Diagnosis not present

## 2018-01-12 LAB — POCT INR: INR: 2.1 (ref 2.0–3.0)

## 2018-01-12 NOTE — Patient Instructions (Signed)
Description   Continue taking coumadin 5mg  daily except 7.5mg  on Tuesdays and Thursdays.  Recheck in 6 weeks.

## 2018-01-14 ENCOUNTER — Other Ambulatory Visit: Payer: Self-pay

## 2018-01-14 ENCOUNTER — Encounter (HOSPITAL_COMMUNITY): Payer: Self-pay | Admitting: Emergency Medicine

## 2018-01-14 ENCOUNTER — Emergency Department (HOSPITAL_COMMUNITY)
Admission: EM | Admit: 2018-01-14 | Discharge: 2018-01-15 | Disposition: A | Discharge status: Home / Self Care | Payer: Medicare Other | Attending: Emergency Medicine | Admitting: Emergency Medicine

## 2018-01-14 DIAGNOSIS — R05 Cough: Secondary | ICD-10-CM | POA: Diagnosis present

## 2018-01-14 DIAGNOSIS — I252 Old myocardial infarction: Secondary | ICD-10-CM | POA: Diagnosis not present

## 2018-01-14 DIAGNOSIS — Z7984 Long term (current) use of oral hypoglycemic drugs: Secondary | ICD-10-CM | POA: Insufficient documentation

## 2018-01-14 DIAGNOSIS — J189 Pneumonia, unspecified organism: Secondary | ICD-10-CM | POA: Diagnosis not present

## 2018-01-14 DIAGNOSIS — Z7901 Long term (current) use of anticoagulants: Secondary | ICD-10-CM | POA: Diagnosis not present

## 2018-01-14 DIAGNOSIS — R509 Fever, unspecified: Secondary | ICD-10-CM | POA: Insufficient documentation

## 2018-01-14 DIAGNOSIS — I2510 Atherosclerotic heart disease of native coronary artery without angina pectoris: Secondary | ICD-10-CM | POA: Diagnosis not present

## 2018-01-14 DIAGNOSIS — I4891 Unspecified atrial fibrillation: Secondary | ICD-10-CM | POA: Insufficient documentation

## 2018-01-14 DIAGNOSIS — J40 Bronchitis, not specified as acute or chronic: Secondary | ICD-10-CM | POA: Diagnosis not present

## 2018-01-14 DIAGNOSIS — I10 Essential (primary) hypertension: Secondary | ICD-10-CM | POA: Insufficient documentation

## 2018-01-14 DIAGNOSIS — Z79899 Other long term (current) drug therapy: Secondary | ICD-10-CM | POA: Diagnosis not present

## 2018-01-14 DIAGNOSIS — E119 Type 2 diabetes mellitus without complications: Secondary | ICD-10-CM | POA: Diagnosis not present

## 2018-01-14 NOTE — ED Triage Notes (Signed)
Pt reports productive coughing, clear mucous, that started Wednesday night, also shaking and htn that started tonight. OTC medications have given little relief. Pt's BP at home was 178/100. Hx of htn, compliant with medications. Pt is HOH.

## 2018-01-15 ENCOUNTER — Emergency Department (HOSPITAL_COMMUNITY): Payer: Medicare Other

## 2018-01-15 LAB — CBC WITH DIFFERENTIAL/PLATELET
ABS IMMATURE GRANULOCYTES: 0.04 10*3/uL (ref 0.00–0.07)
Basophils Absolute: 0.1 10*3/uL (ref 0.0–0.1)
Basophils Relative: 1 %
EOS ABS: 0.2 10*3/uL (ref 0.0–0.5)
Eosinophils Relative: 2 %
HEMATOCRIT: 38.8 % — AB (ref 39.0–52.0)
Hemoglobin: 12.1 g/dL — ABNORMAL LOW (ref 13.0–17.0)
IMMATURE GRANULOCYTES: 0 %
LYMPHS ABS: 0.8 10*3/uL (ref 0.7–4.0)
Lymphocytes Relative: 8 %
MCH: 31.5 pg (ref 26.0–34.0)
MCHC: 31.2 g/dL (ref 30.0–36.0)
MCV: 101 fL — ABNORMAL HIGH (ref 80.0–100.0)
MONOS PCT: 15 %
Monocytes Absolute: 1.5 10*3/uL — ABNORMAL HIGH (ref 0.1–1.0)
NEUTROS ABS: 7.5 10*3/uL (ref 1.7–7.7)
NEUTROS PCT: 74 %
NRBC: 0 % (ref 0.0–0.2)
PLATELETS: 160 10*3/uL (ref 150–400)
RBC: 3.84 MIL/uL — ABNORMAL LOW (ref 4.22–5.81)
RDW: 15 % (ref 11.5–15.5)
WBC: 10.1 10*3/uL (ref 4.0–10.5)

## 2018-01-15 LAB — URINALYSIS, MICROSCOPIC (REFLEX)

## 2018-01-15 LAB — URINALYSIS, ROUTINE W REFLEX MICROSCOPIC
BILIRUBIN URINE: NEGATIVE
Glucose, UA: NEGATIVE mg/dL
KETONES UR: NEGATIVE mg/dL
Nitrite: NEGATIVE
PROTEIN: NEGATIVE mg/dL
Specific Gravity, Urine: 1.025 (ref 1.005–1.030)
pH: 5 (ref 5.0–8.0)

## 2018-01-15 LAB — BASIC METABOLIC PANEL
ANION GAP: 11 (ref 5–15)
BUN: 21 mg/dL (ref 8–23)
CALCIUM: 9.1 mg/dL (ref 8.9–10.3)
CO2: 23 mmol/L (ref 22–32)
Chloride: 106 mmol/L (ref 98–111)
Creatinine, Ser: 1.06 mg/dL (ref 0.61–1.24)
Glucose, Bld: 119 mg/dL — ABNORMAL HIGH (ref 70–99)
POTASSIUM: 4.2 mmol/L (ref 3.5–5.1)
Sodium: 140 mmol/L (ref 135–145)

## 2018-01-15 LAB — PROTIME-INR
INR: 2.18
Prothrombin Time: 24 seconds — ABNORMAL HIGH (ref 11.4–15.2)

## 2018-01-15 MED ORDER — ACETAMINOPHEN 325 MG PO TABS
650.0000 mg | ORAL_TABLET | Freq: Once | ORAL | Status: AC
  Administered 2018-01-15: 650 mg via ORAL
  Filled 2018-01-15: qty 2

## 2018-01-15 MED ORDER — DOXYCYCLINE HYCLATE 100 MG PO CAPS: 100.0000 mg | ORAL_CAPSULE | Freq: Two times a day (BID) | ORAL | 0 refills | Status: DC

## 2018-01-15 MED ORDER — ALBUTEROL SULFATE HFA 108 (90 BASE) MCG/ACT IN AERS
2.0000 | INHALATION_SPRAY | Freq: Once | RESPIRATORY_TRACT | Status: AC
  Administered 2018-01-15: 2 via RESPIRATORY_TRACT
  Filled 2018-01-15: qty 6.7

## 2018-01-15 MED ORDER — ALBUTEROL SULFATE HFA 108 (90 BASE) MCG/ACT IN AERS: 2.0000 | INHALATION_SPRAY | RESPIRATORY_TRACT | 0 refills | Status: DC | PRN

## 2018-01-15 NOTE — Discharge Instructions (Addendum)
Seen today for cough and low-grade fever.  You may have an early pneumonia.  Use your inhaler every 4 hours as needed.  You will be started on doxycycline.  This is relatively safe; however it can sometimes interact with your Coumadin levels.  Follow-up with your primary doctor for recheck.

## 2018-01-15 NOTE — ED Notes (Signed)
PT states understanding of care given, follow up care, and medication prescribed. PT ambulated from ED to car with a steady gait. 

## 2018-01-15 NOTE — ED Provider Notes (Addendum)
[facility] EMERGENCY DEPARTMENT Provider Note   CSN: [id number] Arrival date & time: 01/14/18  2237     History   Chief Complaint Chief Complaint  Patient presents with  . Cough  . Hypertension    HPI Jordan Clayton is a 77 y.o. male.  HPI  This is a 77 year old male with a history of coronary artery disease, hypertension who presents with cough.  Patient's wife reports productive cough since Wednesday.  She reports he has been taking Robitussin with minimal relief.  She took up his blood pressure at home tonight because she noted that he was shaking his blood pressure was 178/100.  This concerned her.  He does take blood pressure medications and has been compliant.  No noted fevers at home.  However, temperature here 100.5.  He has received both the flu and pneumonia vaccine.  He denies any chest pain, shortness of breath, abdominal pain, nausea, vomiting.  No known sick contacts.  No history of smoking or COPD.  Past Medical History:  Diagnosis Date  . Acute MI ([place])    subendocardial  . CAD (coronary artery disease)   . Chronic atrial fibrillation   . Delirium   . DM2 (diabetes mellitus, type 2) ([place])   . Drug therapy    coumadin  . HTN (hypertension)   . Hypernatremia   . Mixed hyperlipidemia   . Septic shock(785.52)     Patient Active Problem List   Diagnosis Date Noted  . Sepsis secondary to UTI ([place]) 07/21/2016  . Coronary artery disease 08/31/2011  . Long term (current) use of anticoagulants 12/16/2010  . EDEMA 02/09/2010  . HYPERNATREMIA 09/12/2009  . 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 DELIRIUM 09/12/2009  . SEPTIC SHOCK 09/12/2009  . DM2 (diabetes mellitus, type 2) ([place]) 03/21/2009  . HYPERLIPIDEMIA-MIXED 03/21/2009  . Essential hypertension 03/21/2009  . ATRIAL FIBRILLATION, CHRONIC 03/21/2009    Past Surgical History:  Procedure Laterality Date  . CORONARY ARTERY BYPASS GRAFT  1994   LIMA to LAD per [facility]        Home  Medications    Prior to Admission medications   Medication Sig Start Date End Date Taking? Authorizing Provider  acetaminophen (TYLENOL) 500 MG tablet Take 500 mg by mouth every 6 (six) hours as needed for fever.    [provider]  albuterol (PROVENTIL HFA;VENTOLIN HFA) 108 (90 Base) MCG/ACT inhaler Inhale 2 puffs into the lungs every 4 (four) hours as needed for wheezing or shortness of breath. 01/15/18   Horton, Barbette Hair, MD  bacitracin ointment Apply 1 application topically 2 (two) times daily. To right arm skin tears 07/01/17   Cardama, Grayce Sessions, MD  Coenzyme Q10 (COQ10 PO) Take 100 mg by mouth daily.    [provider]  doxycycline (VIBRAMYCIN) 100 MG capsule Take 1 capsule (100 mg total) by mouth 2 (two) times daily. 01/15/18   Horton, Barbette Hair, MD  folic acid (FOLVITE) 696 MCG tablet Take 400 mcg by mouth daily. 1/2 tab daily    [provider]  furosemide (LASIX) 40 MG tablet Take 1 tablet (40 mg total) by mouth daily. Daily as needed for swelling 07/23/16   Patrecia Pour, Christean Grief, MD  LUTEIN PO Take 1 tablet by mouth daily.    [provider]  metFORMIN (GLUCOPHAGE) 1000 MG tablet Take 1,000 mg by mouth 2 (two) times daily.      [provider]  metoprolol tartrate (LOPRESSOR) 50 MG tablet Take 1 tablet (  50 mg total) by mouth 2 (two) times daily. 10/31/17   Lelon Perla, MD  Multiple Vitamin (MULTIVITAMIN) tablet Take 1 tablet by mouth daily.      [provider]  nitroGLYCERIN (NITROSTAT) 0.4 MG SL tablet Place 1 tablet (0.4 mg total) under the tongue every 5 (five) minutes as needed (up to 3 tablets). Patient taking differently: Place 0.4 mg under the tongue every 5 (five) minutes as needed for chest pain (up to 3 tablets).  09/26/15   Lelon Perla, MD  pravastatin (PRAVACHOL) 40 MG tablet Take 40 mg by mouth at bedtime.     [provider]  quinapril (ACCUPRIL) 40 MG tablet TAKE 1 TABLET BY MOUTH ONCE DAILY WITH  BREAKFAST 11/04/17   Almyra Deforest, PA  Thiamine HCl (VITAMIN B-1) 100 MG tablet Take 100 mg by mouth daily.      [provider]  vitamin B-12 (CYANOCOBALAMIN) 250 MCG tablet Take 250 mcg by mouth daily.      [provider]  warfarin (COUMADIN) 5 MG tablet TAKE 1 TABLET BY MOUTH ONCE DAILY EVERY  OR  AS  DIRECTED  BY  COUMADIN  CLINIC 10/27/17   Lelon Perla, MD  warfarin (COUMADIN) 7.5 MG tablet TAKE 1 TABLET BY MOUTH EVERY TUESDAY, THURSDAY, AND FRIDAY OR AS DIRECTED BY COUMADIN CLINIC 09/07/17   Lelon Perla, MD    Family History Family History  Problem Relation Age of Onset  . Coronary artery disease Other        family hx  . Hypertension Other        family hx    Social History Social History   Tobacco Use  . Smoking status: Never Smoker  . Smokeless tobacco: Current User    Types: Chew  Substance Use Topics  . Alcohol use: No    Alcohol/week: 0.0 standard drinks  . Drug use: No     Allergies   Atorvastatin; Oxycodone-acetaminophen; Quetiapine; and Rosuvastatin   Review of Systems Review of Systems  Constitutional: Positive for chills and fever.  Respiratory: Positive for cough. Negative for shortness of breath.   Cardiovascular: Negative for chest pain.  Gastrointestinal: Negative for abdominal pain, nausea and vomiting.  Neurological: Negative for syncope and facial asymmetry.  All other systems reviewed and are negative.    Physical Exam Updated Vital Signs BP (!) 153/73   Pulse 85   Temp (S) (!) 100.5 F (38.1 C) (Oral)   Resp (!) 28   Ht 1.803 m (5\' 11" )   Wt 96.2 kg   SpO2 95%   BMI 29.57 kg/m   Physical Exam Vitals signs and nursing note reviewed.  Constitutional:      Appearance: He is well-developed.     Comments: Elderly, nontoxic-appearing  HENT:     Head: Normocephalic and atraumatic.     Nose: Congestion present.  Eyes:     Pupils: Pupils are equal, round, and reactive to light.  Neck:     Musculoskeletal:  Neck supple.  Cardiovascular:     Rate and Rhythm: Normal rate.     Heart sounds: Normal heart sounds. No murmur.     Comments: Irregular rhythm Pulmonary:     Effort: Pulmonary effort is normal. No respiratory distress.     Breath sounds: Wheezing present.     Comments: Expiratory wheezing mainly in the left lung fields Abdominal:     General: Bowel sounds are normal.     Palpations: Abdomen is soft.  Tenderness: There is no abdominal tenderness. There is no rebound.  Musculoskeletal:     Right lower leg: Edema present.     Left lower leg: No edema.  Lymphadenopathy:     Cervical: No cervical adenopathy.  Skin:    General: Skin is warm and dry.  Neurological:     Mental Status: He is oriented to person, place, and time.  Psychiatric:        Mood and Affect: Mood normal.      ED Treatments / Results  Labs (all labs ordered are listed, but only abnormal results are displayed) Labs Reviewed  CBC WITH DIFFERENTIAL/PLATELET - Abnormal; Notable for the following components:      Result Value   RBC 3.84 (*)    Hemoglobin 12.1 (*)    HCT 38.8 (*)    MCV 101.0 (*)    Monocytes Absolute 1.5 (*)    All other components within normal limits  BASIC METABOLIC PANEL - Abnormal; Notable for the following components:   Glucose, Bld 119 (*)    All other components within normal limits  PROTIME-INR - Abnormal; Notable for the following components:   Prothrombin Time 24.0 (*)    All other components within normal limits    EKG EKG Interpretation  Date/Time:  Sunday January 15 2018 01:31:46 EST Ventricular Rate:  78 PR Interval:    QRS Duration: 95 QT Interval:  375 QTC Calculation: 428 R Axis:   68 Text Interpretation:  Atrial fibrillation Confirmed by Thayer Jew [phone]) on 01/15/2018 1:35:52 AM   Radiology Dg Chest 2 View  Result Date: 01/15/2018 CLINICAL DATA:  Cough. Hypertension. High blood pressure. EXAM: CHEST - 2 VIEW COMPARISON:  07/21/2016 FINDINGS:  Lateral view degraded by patient arm position. Prior median sternotomy. Cardiomegaly accentuated by AP portable technique. Atherosclerosis in the transverse aorta. No pleural effusion or pneumothorax. Low lung volumes with resultant pulmonary interstitial prominence. No overt congestive failure. No lobar consolidation. IMPRESSION: Cardiomegaly and low lung volumes, without acute disease. Electronically Signed   By: Abigail Miyamoto M.D.   On: 01/15/2018 01:36    Procedures Procedures (including critical care time)  Medications Ordered in ED Medications  albuterol (PROVENTIL HFA;VENTOLIN HFA) 108 (90 Base) MCG/ACT inhaler 2 puff (2 puffs Inhalation Given 01/15/18 0133)  acetaminophen (TYLENOL) tablet 650 mg (650 mg Oral Given 01/15/18 0132)     Initial Impression / Assessment and Plan / ED Course  I have reviewed the triage vital signs and the nursing notes.  Pertinent labs & imaging results that were available during my care of the patient were reviewed by me and considered in my medical decision making (see chart for details).     Patient presents with cough.  Wheezing on exam but otherwise nontoxic.  Vital signs notable for temperature 100.5.  He is non-ill-appearing.  Lab work obtained and largely reassuring.  He is therapeutic with his Coumadin.  Chest x-ray does not show any evidence of pneumonia however, clinically, patient is presenting with concerns for pneumonia.  Will place on doxycycline.  Informed patient and his wife that this can sometimes interact with Coumadin.  We will have him follow-up with his primary doctor.  He was also given an inhaler and felt much better after using it.  He may have some underlying reactive airway disease.  Follow-up with primary doctor on Monday recommended.  After history, exam, and medical workup I feel the patient has been appropriately medically screened and is safe for discharge home. Pertinent diagnoses  were discussed with the patient. Patient was  given return precautions.  3:51 AM  Nursing concern patient may have a UTI based on strong smelling urine.  Urinalysis sent for processing.  We will hold patient until results.  4:11 AM Urine reviewed.  No obvious UTI; 6-10 white cells and few bacteria.  Culture sent.  Will treat for upper respiratory symptoms at this time.   Final Clinical Impressions(s) / ED Diagnoses   Final diagnoses:  Bronchitis  Community acquired pneumonia, unspecified laterality    ED Discharge Orders         Ordered    albuterol (PROVENTIL HFA;VENTOLIN HFA) 108 (90 Base) MCG/ACT inhaler  Every 4 hours PRN     01/15/18 0328    doxycycline (VIBRAMYCIN) 100 MG capsule  2 times daily     01/15/18 0328           Horton, Barbette Hair, MD 01/15/18 0330    Merryl Hacker, MD 01/15/18 [phone]

## 2018-01-16 LAB — URINE CULTURE: Culture: 30000 — AB

## 2018-01-19 ENCOUNTER — Ambulatory Visit: Payer: Medicare Other | Admitting: *Deleted

## 2018-01-19 DIAGNOSIS — Z7901 Long term (current) use of anticoagulants: Secondary | ICD-10-CM

## 2018-01-19 DIAGNOSIS — I4891 Unspecified atrial fibrillation: Secondary | ICD-10-CM | POA: Diagnosis not present

## 2018-01-19 LAB — POCT INR: INR: 2.9 (ref 2.0–3.0)

## 2018-01-19 NOTE — Patient Instructions (Signed)
Description   Today take 5mg  then continue taking coumadin 5mg  daily except 7.5mg  on Tuesdays and Thursdays.  Recheck in 5-6 weeks.

## 2018-02-07 ENCOUNTER — Other Ambulatory Visit: Payer: Self-pay | Admitting: Family Medicine

## 2018-02-07 DIAGNOSIS — R7402 Elevation of levels of lactic acid dehydrogenase (LDH): Secondary | ICD-10-CM

## 2018-02-07 DIAGNOSIS — R740 Nonspecific elevation of levels of transaminase and lactic acid dehydrogenase [LDH]: Principal | ICD-10-CM

## 2018-02-07 DIAGNOSIS — R634 Abnormal weight loss: Secondary | ICD-10-CM

## 2018-02-07 DIAGNOSIS — R7401 Elevation of levels of liver transaminase levels: Secondary | ICD-10-CM

## 2018-02-08 ENCOUNTER — Ambulatory Visit
Admission: RE | Admit: 2018-02-08 | Discharge: 2018-02-08 | Disposition: A | Discharge status: Home / Self Care | Payer: Medicare Other | Source: Ambulatory Visit | Attending: Family Medicine | Admitting: Family Medicine

## 2018-02-08 DIAGNOSIS — R7401 Elevation of levels of liver transaminase levels: Secondary | ICD-10-CM

## 2018-02-08 DIAGNOSIS — R740 Nonspecific elevation of levels of transaminase and lactic acid dehydrogenase [LDH]: Principal | ICD-10-CM

## 2018-02-08 DIAGNOSIS — R7402 Elevation of levels of lactic acid dehydrogenase (LDH): Secondary | ICD-10-CM

## 2018-02-08 DIAGNOSIS — R634 Abnormal weight loss: Secondary | ICD-10-CM

## 2018-02-21 ENCOUNTER — Encounter: Payer: Self-pay | Admitting: Adult Health

## 2018-02-21 ENCOUNTER — Telehealth: Payer: Self-pay | Admitting: Adult Health

## 2018-02-21 NOTE — Telephone Encounter (Signed)
Received a new patient referral from Dr. Arelia Sneddon for anemia. I cld and spoke to the pt's wife to schedule an appt. Appt has been scheduled for the pt to see Jordan Clayton on 2/5 at 10:30am. Mrs. Dibartolo has been made aware to arrive 30 minutes early. Letter mailed.

## 2018-02-23 ENCOUNTER — Ambulatory Visit: Payer: Medicare Other | Admitting: *Deleted

## 2018-02-23 DIAGNOSIS — I4891 Unspecified atrial fibrillation: Secondary | ICD-10-CM

## 2018-02-23 DIAGNOSIS — Z7901 Long term (current) use of anticoagulants: Secondary | ICD-10-CM

## 2018-02-23 LAB — POCT INR: INR: 2 (ref 2.0–3.0)

## 2018-02-23 NOTE — Patient Instructions (Signed)
Description   Today take 10mg  (2.5mg  and 7.5mg ) then continue taking coumadin 5mg  daily except 7.5mg  on Tuesdays and Thursdays.  Recheck in 6 weeks.

## 2018-03-01 ENCOUNTER — Encounter: Payer: Self-pay | Admitting: Adult Health

## 2018-03-01 ENCOUNTER — Telehealth: Payer: Self-pay | Admitting: Adult Health

## 2018-03-01 ENCOUNTER — Inpatient Hospital Stay: Payer: Medicare Other

## 2018-03-01 ENCOUNTER — Inpatient Hospital Stay: Payer: Medicare Other | Attending: Adult Health | Admitting: Adult Health

## 2018-03-01 VITALS — BP 145/62 | HR 81 | Temp 97.7°F | Resp 16 | Ht 71.0 in | Wt 204.5 lb

## 2018-03-01 DIAGNOSIS — I482 Chronic atrial fibrillation, unspecified: Secondary | ICD-10-CM | POA: Diagnosis not present

## 2018-03-01 DIAGNOSIS — I2510 Atherosclerotic heart disease of native coronary artery without angina pectoris: Secondary | ICD-10-CM

## 2018-03-01 DIAGNOSIS — I10 Essential (primary) hypertension: Secondary | ICD-10-CM | POA: Insufficient documentation

## 2018-03-01 DIAGNOSIS — D539 Nutritional anemia, unspecified: Secondary | ICD-10-CM

## 2018-03-01 LAB — CMP (CANCER CENTER ONLY)
ALT: 13 U/L (ref 0–44)
AST: 18 U/L (ref 15–41)
Albumin: 3.9 g/dL (ref 3.5–5.0)
Alkaline Phosphatase: 47 U/L (ref 38–126)
Anion gap: 5 (ref 5–15)
BUN: 22 mg/dL (ref 8–23)
CHLORIDE: 108 mmol/L (ref 98–111)
CO2: 30 mmol/L (ref 22–32)
Calcium: 9.6 mg/dL (ref 8.9–10.3)
Creatinine: 1 mg/dL (ref 0.61–1.24)
GFR, Est AFR Am: >60 mL/min (ref >60)
Glucose, Bld: 107 mg/dL — ABNORMAL HIGH (ref 70–99)
Potassium: 4.2 mmol/L (ref 3.5–5.1)
Sodium: 143 mmol/L (ref 135–145)
Total Bilirubin: 1.5 mg/dL — ABNORMAL HIGH (ref 0.3–1.2)
Total Protein: 7 g/dL (ref 6.5–8.1)

## 2018-03-01 LAB — VITAMIN B12: VITAMIN B 12: 492 pg/mL (ref 180–914)

## 2018-03-01 LAB — CBC WITH DIFFERENTIAL (CANCER CENTER ONLY)
Abs Immature Granulocytes: 0.05 10*3/uL (ref 0.00–0.07)
Basophils Absolute: 0.1 10*3/uL (ref 0.0–0.1)
Basophils Relative: 1 %
Eosinophils Absolute: 0.2 10*3/uL (ref 0.0–0.5)
Eosinophils Relative: 3 %
HCT: 36.8 % — ABNORMAL LOW (ref 39.0–52.0)
HEMOGLOBIN: 11.6 g/dL — AB (ref 13.0–17.0)
Immature Granulocytes: 1 %
LYMPHS ABS: 1.3 10*3/uL (ref 0.7–4.0)
Lymphocytes Relative: 17 %
MCH: 31.4 pg (ref 26.0–34.0)
MCHC: 31.5 g/dL (ref 30.0–36.0)
MCV: 99.7 fL (ref 80.0–100.0)
Monocytes Absolute: 0.9 10*3/uL (ref 0.1–1.0)
Monocytes Relative: 12 %
Neutro Abs: 5.1 10*3/uL (ref 1.7–7.7)
Neutrophils Relative %: 66 %
Platelet Count: 174 10*3/uL (ref 150–400)
RBC: 3.69 MIL/uL — ABNORMAL LOW (ref 4.22–5.81)
RDW: 16.2 % — ABNORMAL HIGH (ref 11.5–15.5)
WBC Count: 7.5 10*3/uL (ref 4.0–10.5)
nRBC: 0 % (ref 0.0–0.2)

## 2018-03-01 LAB — IRON AND TIBC
Iron: 84 ug/dL (ref 42–163)
Saturation Ratios: 30 % (ref 20–55)
TIBC: 281 ug/dL (ref 202–409)
UIBC: 197 ug/dL (ref 117–376)

## 2018-03-01 LAB — FERRITIN: FERRITIN: 124 ng/mL (ref 24–336)

## 2018-03-01 LAB — FOLATE: Folate: 50.1 ng/mL (ref >5.9)

## 2018-03-01 NOTE — Progress Notes (Addendum)
[facility]  Telephone:([phone]) [phone] Fax:(336) [phone]     ID: Jordan Clayton DOB: 05-Feb-1940  MR#: [id number]  EZM#:[id number]  Patient Care Team: Leonard Downing, MD as PCP - General (Family Medicine) Harriette Ohara, MD OTHER MD:  CHIEF COMPLAINT: macrocytic anemia  CURRENT TREATMENT: oral T03 and oral folic acid supplementation   HISTORY OF CURRENT ILLNESS: Jordan Clayton has had anemia for about one year.  His hemoglobin has been borderline at around 12.1.  He denies any easy bruising or bleeding.  He underwent FOBT in 12/2017 that was negative.  He notes he was sick around Christmas this year and had decreased appetite during this time and he lost about 10 pounds.  He was fatigued, but is slowly regaining his strength.     The patient's subsequent history is as detailed below.  INTERVAL HISTORY: Jordan Clayton is here accompanied by his wife today.  He is feeling good for the most part.  He denies any issues that he has noted, and notes he is here for his anemia evaluation.    REVIEW OF SYSTEMS: Jordan Clayton is slightly fatigued.  He says his appetite is improving.  He is without fevers, chills, night sweats.  He denies bowel/bladder changes, nausea, vomiting.  He denies chest pain, cough, palpitations, or shortness of breath.  A detailed ROS was otherwise non contributory.    PAST MEDICAL HISTORY: Past Medical History:  Diagnosis Date  . 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 CAD (coronary artery disease)   . Chronic atrial fibrillation   . Delirium   . DM2 (diabetes mellitus, type 2) ([place])   . Drug therapy    coumadin  . HTN (hypertension)   . Hypernatremia   . Mixed hyperlipidemia   . Septic shock(785.52)     PAST SURGICAL HISTORY: Past Surgical History:  Procedure Laterality Date  . CORONARY ARTERY BYPASS GRAFT  1994   LIMA to LAD per [facility]    FAMILY HISTORY Family History  Problem Relation Age of Onset  . Coronary artery disease Other        family hx    . Hypertension Other        family hx        SOCIAL HISTORY: Lives with his wife Jordan Clayton at home, is retired, currently chews tobacco, denies ETOH or drug use.       ADVANCED DIRECTIVES:    HEALTH MAINTENANCE: Social History   Tobacco Use  . Smoking status: Never Smoker  . Smokeless tobacco: Current User    Types: Chew  Substance Use Topics  . Alcohol use: No    Alcohol/week: 0.0 standard drinks  . Drug use: No       Allergies  Allergen Reactions  . Atorvastatin   . Oxycodone-Acetaminophen     REACTION: Hallucinations  . Quetiapine     REACTION: disorientated  . Rosuvastatin     REACTION: Reaction not known    Current Outpatient Medications  Medication Sig Dispense Refill  . Coenzyme Q10 (COQ10 PO) Take 100 mg by mouth daily.    . folic acid (FOLVITE) 546 MCG tablet Take 400 mcg by mouth daily. 1/2 tab daily    . furosemide (LASIX) 40 MG tablet Take 1 tablet (40 mg total) by mouth daily. Daily as needed for swelling 90 tablet 0  . metFORMIN (GLUCOPHAGE) 1000 MG tablet Take 1,000 mg by mouth 2 (two) times daily.      . metoprolol tartrate (LOPRESSOR) 50 MG tablet  Take 1 tablet (50 mg total) by mouth 2 (two) times daily. 180 tablet 3  . Multiple Vitamin (MULTIVITAMIN) tablet Take 1 tablet by mouth daily.      . Multiple Vitamins-Minerals (PRESERVISION AREDS 2 PO) Take by mouth daily.    . nitroGLYCERIN (NITROSTAT) 0.4 MG SL tablet Place 1 tablet (0.4 mg total) under the tongue every 5 (five) minutes as needed (up to 3 tablets). (Patient taking differently: Place 0.4 mg under the tongue every 5 (five) minutes as needed for chest pain (up to 3 tablets). ) 25 tablet 6  . pravastatin (PRAVACHOL) 40 MG tablet Take 40 mg by mouth at bedtime.     . quinapril (ACCUPRIL) 40 MG tablet TAKE 1 TABLET BY MOUTH ONCE DAILY WITH BREAKFAST 90 tablet 2  . Thiamine HCl (VITAMIN B-1) 100 MG tablet Take 100 mg by mouth daily.      . vitamin B-12 (CYANOCOBALAMIN) 250 MCG tablet Take 250  mcg by mouth daily.      Marland Kitchen warfarin (COUMADIN) 5 MG tablet TAKE 1 TABLET BY MOUTH ONCE DAILY EVERY  OR  AS  DIRECTED  BY  COUMADIN  CLINIC 90 tablet 0  . warfarin (COUMADIN) 7.5 MG tablet TAKE 1 TABLET BY MOUTH EVERY TUESDAY, THURSDAY, AND FRIDAY OR AS DIRECTED BY COUMADIN CLINIC 30 tablet 1   No current facility-administered medications for this visit.     OBJECTIVE:  Vitals:   03/01/18 1024  BP: (!) 145/62  Pulse: 81  Resp: 16  Temp: 97.7 F (36.5 C)  SpO2: 96%     Body mass index is 28.52 kg/m.   Wt Readings from Last 3 Encounters:  03/01/18 92.8 kg  01/14/18 96.2 kg  10/31/17 94.3 kg      ECOG FS:1 - Symptomatic but completely ambulatory GENERAL: Patient is a well appearing male in no acute distress HEENT:  Sclerae anicteric.  Oropharynx clear and moist. No ulcerations or evidence of oropharyngeal candidiasis. Neck is supple.  NODES:  No cervical, supraclavicular, or axillary lymphadenopathy palpated.  LUNGS:  Irregular Irregular ABDOMEN:  Soft, nontender.  Positive, normoactive bowel sounds. No organomegaly palpated. MSK:  No focal spinal tenderness to palpation.  EXTREMITIES:  No peripheral edema.   SKIN:  Clear with no obvious rashes or skin changes. No nail dyscrasia. NEURO:  Nonfocal. Well oriented.  Appropriate affect.     LAB RESULTS:  CMP     Component Value Date/Time   NA 143 03/01/2018 1137   NA 146 (H) 08/09/2016 0928   K 4.2 03/01/2018 1137   CL 108 03/01/2018 1137   CO2 30 03/01/2018 1137   GLUCOSE 107 (H) 03/01/2018 1137   BUN 22 03/01/2018 1137   BUN 26 08/09/2016 0928   CREATININE 1.00 03/01/2018 1137   CREATININE 1.12 09/25/2015 1111   CALCIUM 9.6 03/01/2018 1137   PROT 7.0 03/01/2018 1137   ALBUMIN 3.9 03/01/2018 1137   AST 18 03/01/2018 1137   ALT 13 03/01/2018 1137   ALKPHOS 47 03/01/2018 1137   BILITOT 1.5 (H) 03/01/2018 1137   GFRNONAA >60 03/01/2018 1137   GFRAA >60 03/01/2018 1137    No results found for: TOTALPROTELP,  ALBUMINELP, A1GS, A2GS, BETS, BETA2SER, GAMS, MSPIKE, SPEI  No results found for: KPAFRELGTCHN, LAMBDASER, KAPLAMBRATIO  Lab Results  Component Value Date   WBC 7.5 03/01/2018   NEUTROABS 5.1 03/01/2018   HGB 11.6 (L) 03/01/2018   HCT 36.8 (L) 03/01/2018   MCV 99.7 03/01/2018   PLT 174 03/01/2018  Chemistry      Component Value Date/Time   NA 143 03/01/2018 1137   NA 146 (H) 08/09/2016 0928   K 4.2 03/01/2018 1137   CL 108 03/01/2018 1137   CO2 30 03/01/2018 1137   BUN 22 03/01/2018 1137   BUN 26 08/09/2016 0928   CREATININE 1.00 03/01/2018 1137   CREATININE 1.12 09/25/2015 1111      Component Value Date/Time   CALCIUM 9.6 03/01/2018 1137   ALKPHOS 47 03/01/2018 1137   AST 18 03/01/2018 1137   ALT 13 03/01/2018 1137   BILITOT 1.5 (H) 03/01/2018 1137       No results found for: LABCA2  No components found for: [id number]  Recent Labs  Lab 02/23/18 0920  INR 2.0    No results found for: LABCA2  No results found for: YTK160  No results found for: FUX323  No results found for: FTD322  No results found for: CA2729  No components found for: HGQUANT  No results found for: CEA1 / No results found for: CEA1   No results found for: AFPTUMOR  No results found for: CHROMOGRNA  No results found for: PSA1  Appointment on 03/01/2018  Component Date Value Ref Range Status  . Sodium 03/01/2018 143  135 - 145 mmol/L Final  . Potassium 03/01/2018 4.2  3.5 - 5.1 mmol/L Final  . Chloride 03/01/2018 108  98 - 111 mmol/L Final  . CO2 03/01/2018 30  22 - 32 mmol/L Final  . Glucose, Bld 03/01/2018 107* 70 - 99 mg/dL Final  . BUN 03/01/2018 22  8 - 23 mg/dL Final  . Creatinine 03/01/2018 1.00  0.61 - 1.24 mg/dL Final  . Calcium 03/01/2018 9.6  8.9 - 10.3 mg/dL Final  . Total Protein 03/01/2018 7.0  6.5 - 8.1 g/dL Final  . Albumin 03/01/2018 3.9  3.5 - 5.0 g/dL Final  . AST 03/01/2018 18  15 - 41 U/L Final  . ALT 03/01/2018 13  0 - 44 U/L Final  . Alkaline  Phosphatase 03/01/2018 47  38 - 126 U/L Final  . Total Bilirubin 03/01/2018 1.5* 0.3 - 1.2 mg/dL Final  . GFR, Est Non Af Am 03/01/2018 >60  >60 mL/min Final  . GFR, Est AFR Am 03/01/2018 >60  >60 mL/min Final  . Anion gap 03/01/2018 5  5 - 15 Final   Performed at [facility], [place] [address] [place], [place] [address]  . WBC Count 03/01/2018 7.5  4.0 - 10.5 K/uL Final  . RBC 03/01/2018 3.69* 4.22 - 5.81 MIL/uL Final  . Hemoglobin 03/01/2018 11.6* 13.0 - 17.0 g/dL Final  . HCT 03/01/2018 36.8* 39.0 - 52.0 % Final  . MCV 03/01/2018 99.7  80.0 - 100.0 fL Final  . MCH 03/01/2018 31.4  26.0 - 34.0 pg Final  . MCHC 03/01/2018 31.5  30.0 - 36.0 g/dL Final  . RDW 03/01/2018 16.2* 11.5 - 15.5 % Final  . Platelet Count 03/01/2018 174  150 - 400 K/uL Final  . nRBC 03/01/2018 0.0  0.0 - 0.2 % Final  . Neutrophils Relative % 03/01/2018 66  % Final  . Neutro Abs 03/01/2018 5.1  1.7 - 7.7 K/uL Final  . Lymphocytes Relative 03/01/2018 17  % Final  . Lymphs Abs 03/01/2018 1.3  0.7 - 4.0 K/uL Final  . Monocytes Relative 03/01/2018 12  % Final  . Monocytes Absolute 03/01/2018 0.9  0.1 - 1.0 K/uL Final  . Eosinophils Relative 03/01/2018 3  % Final  .  Eosinophils Absolute 03/01/2018 0.2  0.0 - 0.5 K/uL Final  . Basophils Relative 03/01/2018 1  % Final  . Basophils Absolute 03/01/2018 0.1  0.0 - 0.1 K/uL Final  . Immature Granulocytes 03/01/2018 1  % Final  . Abs Immature Granulocytes 03/01/2018 0.05  0.00 - 0.07 K/uL Final   Performed at [facility], [address] [place], [place] [address]    (this displays the last labs from the last 3 days)  No results found for: TOTALPROTELP, ALBUMINELP, A1GS, A2GS, BETS, BETA2SER, GAMS, MSPIKE, SPEI (this displays SPEP labs)  No results found for: KPAFRELGTCHN, LAMBDASER, KAPLAMBRATIO (kappa/lambda light chains)  No results found for: HGBA, HGBA2QUANT, HGBFQUANT, HGBSQUAN (Hemoglobinopathy evaluation)    No results found for: LDH  Lab Results  Component Value Date   IRON 29 (L) 08/19/2009   TIBC 172 (L) 08/19/2009   IRONPCTSAT 17 (L) 08/19/2009   (Iron and TIBC)  Lab Results  Component Value Date   FERRITIN 566 (H) 08/19/2009    Urinalysis    Component Value Date/Time   COLORURINE YELLOW 01/15/2018 0339   APPEARANCEUR CLEAR 01/15/2018 0339   LABSPEC 1.025 01/15/2018 0339   PHURINE 5.0 01/15/2018 0339   GLUCOSEU NEGATIVE 01/15/2018 0339   HGBUR SMALL (A) 01/15/2018 0339   BILIRUBINUR NEGATIVE 01/15/2018 0339   KETONESUR NEGATIVE 01/15/2018 0339   PROTEINUR NEGATIVE 01/15/2018 0339   UROBILINOGEN 1.0 07/31/2009 0949   NITRITE NEGATIVE 01/15/2018 0339   LEUKOCYTESUR TRACE (A) 01/15/2018 0339     STUDIES: US Abdomen Complete  Result Date: 02/08/2018 CLINICAL DATA:  Weight loss.  Elevated LFTs EXAM: ABDOMEN ULTRASOUND COMPLETE COMPARISON:  11/04/2009 FINDINGS: Gallbladder: Prior cholecystectomy Common bile duct: Diameter: Upper limits normal in caliber at 7 mm Liver: No focal lesion identified. Within normal limits in parenchymal echogenicity. Portal vein is patent on color Doppler imaging with normal direction of blood flow towards the liver. IVC: Not well visualized due to overlying bowel gas. Pancreas: Visualized portion unremarkable. Spleen: Size and appearance within normal limits. Right Kidney: Length: 11.8 cm. Echogenicity within normal limits. No mass or hydronephrosis visualized. Left Kidney: Length: 11.8 cm. Echogenicity within normal limits. No mass or hydronephrosis visualized. Abdominal aorta: No aneurysm visualized. Other findings: None. IMPRESSION: Prior cholecystectomy.  No acute findings. Electronically Signed   By: Rolm Baptise M.D.   On: 02/08/2018 16:31      ASSESSMENT/PLAN: 78 y.o. man with PMH of A fib, HTN, CAD, MI, hyperlipidemia here today for evaluation of macrocytic anemia.    Jordan Clayton met with myself and Dr. Lindi Adie to review the recent blood work  he has undergone over the past 1-2 months and to talk about his anemia and a plan.    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  This is typically due to one of three causes: b12 deficiency, folate deficiency, or myelodysplastic syndrome.  We will check some labs on him today to further evaluate the first two.  To evaluate for MDS we reviewed that requires a bone marrow biopsy.  Since that is an invasive procedure, will refrain from doing that unless his hemoglobin decreases below 10.    I will call him once all of his labs result.  Jordan Clayton will return in 6 months for labs and f/u.    Jordan Clayton has a good understanding of the overall plan. He will call with any problems that may develop before her next visit here.   Jordan Bihari, NP  03/01/2018 12:39 PM Medical Oncology and Hematology [facility]  [place], [place] [address] Tel. [phone]    Fax. [phone]  Attending Note  I personally saw and examined Jordan Clayton. The plan of care was discussed with him. I agree with the assessment and plan as documented above. Mild microcytic anemia: We will check J28 folic acid levels.  He is currently on supplementation and we can decide if he needs B12 injections or whether he can continue with the current oral therapy.  Because his degree of anemia is so minimal I do not recommend a bone marrow biopsy.  However if his hemoglobin drops below 10 we will have to perform it to rule out myelodysplastic syndrome. Return to clinic in 6 months with labs and follow-up.  We will call the patient with results of blood work done today. Signed Harriette Ohara, MD

## 2018-03-01 NOTE — Telephone Encounter (Signed)
Gave avs and calendar ° °

## 2018-03-02 LAB — INTRINSIC FACTOR ANTIBODIES: Intrinsic Factor: 1 AU/mL (ref 0.0–1.1)

## 2018-03-02 LAB — ANTI-PARIETAL ANTIBODY: Parietal Cell Antibody-IgG: 3.1 Units (ref 0.0–20.0)

## 2018-03-06 ENCOUNTER — Other Ambulatory Visit: Payer: Self-pay | Admitting: Cardiology

## 2018-04-06 ENCOUNTER — Other Ambulatory Visit: Payer: Self-pay

## 2018-04-06 ENCOUNTER — Ambulatory Visit: Payer: Medicare Other | Admitting: Pharmacist

## 2018-04-06 DIAGNOSIS — I4891 Unspecified atrial fibrillation: Secondary | ICD-10-CM | POA: Diagnosis not present

## 2018-04-06 DIAGNOSIS — Z7901 Long term (current) use of anticoagulants: Secondary | ICD-10-CM | POA: Diagnosis not present

## 2018-04-06 LAB — POCT INR: INR: 2.6 (ref 2.0–3.0)

## 2018-04-06 NOTE — Patient Instructions (Signed)
Description   Continue taking coumadin 5mg  daily except 7.5mg  on Tuesdays and Thursdays.  Recheck in 6 weeks.

## 2018-05-16 ENCOUNTER — Telehealth: Payer: Self-pay

## 2018-05-16 NOTE — Telephone Encounter (Signed)
Called to screen pt wanted to cancel due to covid concerns

## 2018-07-12 ENCOUNTER — Other Ambulatory Visit: Payer: Self-pay | Admitting: Physician Assistant

## 2018-08-21 ENCOUNTER — Telehealth: Payer: Self-pay | Admitting: *Deleted

## 2018-08-21 NOTE — Telephone Encounter (Signed)

## 2018-08-28 ENCOUNTER — Other Ambulatory Visit: Payer: Self-pay

## 2018-08-28 ENCOUNTER — Ambulatory Visit (INDEPENDENT_AMBULATORY_CARE_PROVIDER_SITE_OTHER): Payer: Medicare Other

## 2018-08-28 DIAGNOSIS — Z7901 Long term (current) use of anticoagulants: Secondary | ICD-10-CM

## 2018-08-28 DIAGNOSIS — I4891 Unspecified atrial fibrillation: Secondary | ICD-10-CM | POA: Diagnosis not present

## 2018-08-28 LAB — POCT INR: INR: 1.8 — AB (ref 2.0–3.0)

## 2018-08-28 NOTE — Patient Instructions (Signed)
Description   Take 7.5mg  today, then resume same dosage 5mg  daily except 7.5mg  on Tuesdays and Thursdays.  Recheck in 4 weeks.

## 2018-08-30 ENCOUNTER — Ambulatory Visit: Payer: Medicare Other | Admitting: Adult Health

## 2018-08-30 ENCOUNTER — Other Ambulatory Visit: Payer: Medicare Other

## 2018-09-25 ENCOUNTER — Ambulatory Visit (INDEPENDENT_AMBULATORY_CARE_PROVIDER_SITE_OTHER): Payer: Medicare Other | Admitting: Pharmacist

## 2018-09-25 ENCOUNTER — Other Ambulatory Visit: Payer: Self-pay

## 2018-09-25 DIAGNOSIS — Z7901 Long term (current) use of anticoagulants: Secondary | ICD-10-CM | POA: Diagnosis not present

## 2018-09-25 DIAGNOSIS — I4891 Unspecified atrial fibrillation: Secondary | ICD-10-CM

## 2018-09-25 LAB — POCT INR: INR: 1.9 — AB (ref 2.0–3.0)

## 2018-09-25 NOTE — Patient Instructions (Signed)
Description   Take 7.5 mg today, then start taking 5 mg daily except 7.5 mg on Tuesdays, Thursdays, and Saturdays. Recheck in 3 weeks.

## 2018-10-06 ENCOUNTER — Other Ambulatory Visit: Payer: Self-pay | Admitting: Cardiology

## 2018-10-16 ENCOUNTER — Other Ambulatory Visit: Payer: Self-pay

## 2018-10-16 ENCOUNTER — Ambulatory Visit (INDEPENDENT_AMBULATORY_CARE_PROVIDER_SITE_OTHER): Payer: Medicare Other | Admitting: *Deleted

## 2018-10-16 DIAGNOSIS — I4891 Unspecified atrial fibrillation: Secondary | ICD-10-CM | POA: Diagnosis not present

## 2018-10-16 DIAGNOSIS — Z7901 Long term (current) use of anticoagulants: Secondary | ICD-10-CM

## 2018-10-16 LAB — POCT INR: INR: 2.1 (ref 2.0–3.0)

## 2018-10-16 NOTE — Patient Instructions (Signed)
Description   Continue taking 5 mg daily except 7.5 mg on Tuesdays, Thursdays, and Saturdays. Recheck in 4 weeks. Coumadin Clinic [phone] Main [phone]

## 2018-11-13 ENCOUNTER — Ambulatory Visit: Payer: Medicare Other | Admitting: Pharmacist

## 2018-11-13 ENCOUNTER — Other Ambulatory Visit: Payer: Self-pay

## 2018-11-13 DIAGNOSIS — I4891 Unspecified atrial fibrillation: Secondary | ICD-10-CM | POA: Diagnosis not present

## 2018-11-13 DIAGNOSIS — Z7901 Long term (current) use of anticoagulants: Secondary | ICD-10-CM | POA: Diagnosis not present

## 2018-11-13 LAB — POCT INR: INR: 2.4 (ref 2.0–3.0)

## 2018-11-13 NOTE — Patient Instructions (Signed)
Description   Continue taking 5 mg daily except 7.5 mg on Tuesdays, Thursdays, and Saturdays. Recheck in 5 weeks. Coumadin Clinic [phone] Main [phone]

## 2018-12-07 ENCOUNTER — Other Ambulatory Visit: Payer: Self-pay | Admitting: Cardiology

## 2018-12-18 ENCOUNTER — Other Ambulatory Visit: Payer: Self-pay

## 2018-12-18 ENCOUNTER — Ambulatory Visit: Payer: Medicare Other | Admitting: *Deleted

## 2018-12-18 DIAGNOSIS — Z7901 Long term (current) use of anticoagulants: Secondary | ICD-10-CM | POA: Diagnosis not present

## 2018-12-18 DIAGNOSIS — I4891 Unspecified atrial fibrillation: Secondary | ICD-10-CM | POA: Diagnosis not present

## 2018-12-18 LAB — POCT INR: INR: 2.5 (ref 2.0–3.0)

## 2018-12-18 NOTE — Patient Instructions (Signed)
Description   Continue taking 5 mg daily except 7.5 mg on Tuesdays, Thursdays, and Saturdays. Recheck in 6 weeks. Coumadin Clinic [phone] Main [phone]

## 2018-12-26 ENCOUNTER — Other Ambulatory Visit: Payer: Self-pay | Admitting: Cardiology

## 2019-01-10 ENCOUNTER — Telehealth: Payer: Self-pay | Admitting: Cardiology

## 2019-01-10 NOTE — Telephone Encounter (Signed)
Pts wife to wait for him in the car for his 01/11/19 appt and she says he can have her on Speakerphone during the visit so she cn also hear Dr. Jacalyn Lefevre recommendations... pt can hear and will be sure to wear his hearing aid.. 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 I apologized to her for the inconvenience and she expressed understanding and agreed.

## 2019-01-10 NOTE — Telephone Encounter (Signed)
New message   Patient's wife states that she will be coming with the patient to appt on tomorrow her husband is death in one ear. Please advise.

## 2019-01-11 ENCOUNTER — Other Ambulatory Visit: Payer: Self-pay

## 2019-01-11 ENCOUNTER — Ambulatory Visit: Payer: Medicare Other | Admitting: Cardiology

## 2019-01-11 ENCOUNTER — Encounter: Payer: Self-pay | Admitting: Cardiology

## 2019-01-11 ENCOUNTER — Other Ambulatory Visit: Payer: Self-pay | Admitting: Cardiology

## 2019-01-11 VITALS — BP 128/78 | HR 56 | Ht 71.0 in | Wt 205.8 lb

## 2019-01-11 DIAGNOSIS — I2510 Atherosclerotic heart disease of native coronary artery without angina pectoris: Secondary | ICD-10-CM

## 2019-01-11 DIAGNOSIS — E7800 Pure hypercholesterolemia, unspecified: Secondary | ICD-10-CM

## 2019-01-11 DIAGNOSIS — I10 Essential (primary) hypertension: Secondary | ICD-10-CM

## 2019-01-11 DIAGNOSIS — I4891 Unspecified atrial fibrillation: Secondary | ICD-10-CM

## 2019-01-11 LAB — CBC
Hematocrit: 39.2 % (ref 37.5–51.0)
Hemoglobin: 12.5 g/dL — ABNORMAL LOW (ref 13.0–17.7)
MCH: 31.3 pg (ref 26.6–33.0)
MCHC: 31.9 g/dL (ref 31.5–35.7)
MCV: 98 fL — ABNORMAL HIGH (ref 79–97)
Platelets: 158 10*3/uL (ref 150–450)
RBC: 3.99 x10E6/uL — ABNORMAL LOW (ref 4.14–5.80)
RDW: 15.6 % — ABNORMAL HIGH (ref 11.6–15.4)
WBC: 7.8 10*3/uL (ref 3.4–10.8)

## 2019-01-11 LAB — COMPREHENSIVE METABOLIC PANEL
ALT: 11 IU/L (ref 0–44)
AST: 20 IU/L (ref 0–40)
Albumin/Globulin Ratio: 2.2 (ref 1.2–2.2)
Albumin: 4.4 g/dL (ref 3.7–4.7)
Alkaline Phosphatase: 52 IU/L (ref 39–117)
BUN/Creatinine Ratio: 22 (ref 10–24)
BUN: 25 mg/dL (ref 8–27)
Bilirubin Total: 1.2 mg/dL (ref 0.0–1.2)
CO2: 24 mmol/L (ref 20–29)
Calcium: 10 mg/dL (ref 8.6–10.2)
Chloride: 104 mmol/L (ref 96–106)
Creatinine, Ser: 1.14 mg/dL (ref 0.76–1.27)
GFR calc Af Amer: 70 mL/min/{1.73_m2} (ref >59)
GFR calc non Af Amer: 60 mL/min/{1.73_m2} (ref >59)
Globulin, Total: 2 g/dL (ref 1.5–4.5)
Glucose: 128 mg/dL — ABNORMAL HIGH (ref 65–99)
Potassium: 4.8 mmol/L (ref 3.5–5.2)
Sodium: 145 mmol/L — ABNORMAL HIGH (ref 134–144)
Total Protein: 6.4 g/dL (ref 6.0–8.5)

## 2019-01-11 LAB — LIPID PANEL
Chol/HDL Ratio: 2.5 ratio (ref 0.0–5.0)
Cholesterol, Total: 110 mg/dL (ref 100–199)
HDL: 44 mg/dL (ref >39)
LDL Chol Calc (NIH): 46 mg/dL (ref 0–99)
Triglycerides: 107 mg/dL (ref 0–149)
VLDL Cholesterol Cal: 20 mg/dL (ref 5–40)

## 2019-01-11 MED ORDER — METOPROLOL TARTRATE 25 MG PO TABS: 25.0000 mg | ORAL_TABLET | Freq: Two times a day (BID) | ORAL | 3 refills | Status: DC

## 2019-01-11 NOTE — Progress Notes (Signed)
HPI: FU CAD and atrial fibrillation. Prior CABG with LIMA to LAD. Cath 2006 showed 100 LAD, 50 D1, 70 distal Lcx, 100 first OM, 75 prox RCA and 99 distal; LIMA patent; 60/70 distal LAD, normal EF. Had PCI of prox and distal RCA and first OM. Nuclear study 10/14 showed inferior defect with no ischemia; not gated. Echocardiogram October 2019 showed ejection fraction 45%, mildly dilated ascending aorta, severe biatrial enlargement, mild right ventricular enlargement with reduced function and severe pulmonary hypertension.  Abdominal ultrasound January 2020 showed no abdominal aortic aneurysm.  Since last seen,patient denies dyspnea, chest pain, palpitations, syncope or bleeding.  Current Outpatient Medications  Medication Sig Dispense Refill  . Coenzyme Q10 (COQ10 PO) Take 100 mg by mouth daily.    . folic acid (FOLVITE) [id number] MCG tablet Take 400 mcg by mouth daily. 1/2 tab daily    . furosemide (LASIX) 40 MG tablet Take 1 tablet (40 mg total) by mouth daily. Daily as needed for swelling 90 tablet 0  . metFORMIN (GLUCOPHAGE) 1000 MG tablet Take 1,000 mg by mouth 2 (two) times daily.      . metoprolol tartrate (LOPRESSOR) 50 MG tablet Take 1 tablet by mouth twice daily 60 tablet 2  . Multiple Vitamin (MULTIVITAMIN) tablet Take 1 tablet by mouth daily.      . Multiple Vitamins-Minerals (PRESERVISION AREDS 2 PO) Take by mouth daily.    . nitroGLYCERIN (NITROSTAT) 0.4 MG SL tablet Place 1 tablet (0.4 mg total) under the tongue every 5 (five) minutes as needed (up to 3 tablets). (Patient taking differently: Place 0.4 mg under the tongue every 5 (five) minutes as needed for chest pain (up to 3 tablets). ) 25 tablet 6  . pravastatin (PRAVACHOL) 40 MG tablet Take 40 mg by mouth at bedtime.     . quinapril (ACCUPRIL) 40 MG tablet Take 1 tablet by mouth once daily with breakfast 90 tablet 0  . Thiamine HCl (VITAMIN B-1) 100 MG tablet Take 100 mg by mouth daily.      . vitamin B-12 (CYANOCOBALAMIN) 250 MCG  tablet Take 250 mcg by mouth daily.      Marland Kitchen warfarin (COUMADIN) 5 MG tablet TAKE 1 TABLET BY MOUTH ONCE DAILY OR  AS DIRECTED  BY  COUMADIN 90 tablet 0  . warfarin (COUMADIN) 7.5 MG tablet TAKE AS DIRECTED BY COUMADIN CLINIC 30 tablet 0   No current facility-administered medications for this visit.     Past Medical History:  Diagnosis Date  . 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 CAD (coronary artery disease)   . Chronic atrial fibrillation ([place])   . Delirium   . DM2 (diabetes mellitus, type 2) ([place])   . Drug therapy    coumadin  . HTN (hypertension)   . Hypernatremia   . Mixed hyperlipidemia   . Septic shock(785.52)     Past Surgical History:  Procedure Laterality Date  . CORONARY ARTERY BYPASS GRAFT  1994   LIMA to LAD per [facility]    Social History   Socioeconomic History  . Marital status: Married    Spouse name: Not on file  . Number of children: Not on file  . Years of education: Not on file  . Highest education level: Not on file  Occupational History  . Not on file  Tobacco Use  . Smoking status: Never Smoker  . Smokeless tobacco: Current User    Types: Chew  Substance and Sexual Activity  .  Alcohol use: No    Alcohol/week: 0.0 standard drinks  . Drug use: No  . Sexual activity: Not Currently  Other Topics Concern  . Not on file  Social History Narrative   Married; retired; disabled, does not get regular exercise.    Social Determinants of Health   Financial Resource Strain:   . Difficulty of Paying Living Expenses: Not on file  Food Insecurity:   . Worried About Charity fundraiser in the Last Year: Not on file  . Ran Out of Food in the Last Year: Not on file  Transportation Needs:   . Lack of Transportation (Medical): Not on file  . Lack of Transportation (Non-Medical): Not on file  Physical Activity:   . Days of Exercise per Week: Not on file  . Minutes of Exercise per Session: Not on file  Stress:   . Feeling of Stress : Not on file  Social  Connections:   . Frequency of Communication with Friends and Family: Not on file  . Frequency of Social Gatherings with Friends and Family: Not on file  . Attends Religious Services: Not on file  . Active Member of Clubs or Organizations: Not on file  . Attends Archivist Meetings: Not on file  . Marital Status: Not on file  Intimate Partner Violence:   . Fear of Current or Ex-Partner: Not on file  . Emotionally Abused: Not on file  . Physically Abused: Not on file  . Sexually Abused: Not on file    Family History  Problem Relation Age of Onset  . Coronary artery disease Other        family hx  . Hypertension Other        family hx    ROS: no fevers or chills, productive cough, hemoptysis, dysphasia, odynophagia, melena, hematochezia, dysuria, hematuria, rash, seizure activity, orthopnea, PND, pedal edema, claudication. Remaining systems are negative.  Physical Exam: Well-developed well-nourished in no acute distress.  Skin is warm and dry.  HEENT is normal.  Neck is supple.  Chest is clear to auscultation with normal expansion.  Cardiovascular exam is irregular and bradycardic Abdominal exam nontender or distended. No masses palpated. Extremities show no edema. neuro grossly intact  ECG-atrial fibrillation with PVCs or aberrantly conducted beats, no ST changes.  Personally reviewed  A/P  1 permanent atrial fibrillation-plan to continue present dose of metoprolol for rate control.  Patient is not interested in DOAC's.  Continue Coumadin with goal INR 2-3.  2 CAD-continue statin; no asa given need for coumadin.  3 hyperlipidemia-continue statin.  Note he did not tolerate Crestor or Lipitor previously.  4 hypertension-patient's blood pressure is controlled.  Continue present medical regimen.  5 chronic combined systolic/diastolic congestive heart failure-patient appears to be euvolemic.  Continue present dose of diuretic (he is on Lasix as needed).  Continue  fluid restriction and low-sodium diet.  6 history of moderate mitral regurgitation-trivial on most recent study.  Kirk Ruths, MD

## 2019-01-11 NOTE — Patient Instructions (Signed)
Medication Instructions:  DECREASE METOPROLOL TO 25 MG TWICE DAILY= 1/2 OF THE 50 MG TABLET TWICE DAILY  *If you need a refill on your cardiac medications before your next appointment, please call your pharmacy*  Lab Work: Your physician recommends that you HAVE LAB WORK TODAY If you have labs (blood work) drawn today and your tests are completely normal, you will receive your results only by: Marland Kitchen MyChart Message (if you have MyChart) OR . A paper copy in the mail If you have any lab test that is abnormal or we need to change your treatment, we will call you to review the results.  Follow-Up: At [facility], you and your health needs are our priority.  As part of our continuing mission to provide you with exceptional heart care, we have created designated Provider Care Teams.  These Care Teams include your primary Cardiologist (physician) and Advanced Practice Providers (APPs -  Physician Assistants and Nurse Practitioners) who all work together to provide you with the care you need, when you need it.  Your next appointment:   12 month(s)  The format for your next appointment:   Either In Person or Virtual  Provider:   You may see Kirk Ruths MD or one of the following Advanced Practice Providers on your designated Care Team:    Kerin Ransom, PA-C  [place], [place]  Coletta Memos, [place]

## 2019-01-15 ENCOUNTER — Encounter: Payer: Self-pay | Admitting: *Deleted

## 2019-01-17 ENCOUNTER — Encounter

## 2019-01-17 ENCOUNTER — Ambulatory Visit: Payer: Medicare Other | Admitting: Cardiology

## 2019-01-29 ENCOUNTER — Other Ambulatory Visit: Payer: Self-pay

## 2019-01-29 ENCOUNTER — Ambulatory Visit: Payer: Medicare Other

## 2019-01-29 DIAGNOSIS — I4891 Unspecified atrial fibrillation: Secondary | ICD-10-CM | POA: Diagnosis not present

## 2019-01-29 DIAGNOSIS — Z7901 Long term (current) use of anticoagulants: Secondary | ICD-10-CM

## 2019-01-29 LAB — POCT INR: INR: 2.5 (ref 2.0–3.0)

## 2019-01-29 NOTE — Patient Instructions (Signed)
Description   Continue taking 5 mg daily except 7.5 mg on Tuesdays, Thursdays, and Saturdays. Recheck in 6 weeks. Coumadin Clinic [phone] Main [phone]

## 2019-02-23 ENCOUNTER — Other Ambulatory Visit: Payer: Self-pay | Admitting: Cardiology

## 2019-03-12 ENCOUNTER — Other Ambulatory Visit: Payer: Self-pay

## 2019-03-12 ENCOUNTER — Ambulatory Visit: Payer: Medicare Other | Admitting: *Deleted

## 2019-03-12 DIAGNOSIS — I4891 Unspecified atrial fibrillation: Secondary | ICD-10-CM

## 2019-03-12 DIAGNOSIS — Z7901 Long term (current) use of anticoagulants: Secondary | ICD-10-CM

## 2019-03-12 LAB — POCT INR: INR: 2 (ref 2.0–3.0)

## 2019-03-12 NOTE — Patient Instructions (Signed)
Description   Continue taking 5 mg daily except 7.5 mg on Tuesdays, Thursdays, and Saturdays. Recheck in 6 weeks. Coumadin Clinic [phone] Main [phone]

## 2019-04-23 ENCOUNTER — Other Ambulatory Visit: Payer: Self-pay

## 2019-04-23 ENCOUNTER — Ambulatory Visit: Payer: Medicare Other | Admitting: Pharmacist

## 2019-04-23 DIAGNOSIS — I4891 Unspecified atrial fibrillation: Secondary | ICD-10-CM

## 2019-04-23 DIAGNOSIS — Z7901 Long term (current) use of anticoagulants: Secondary | ICD-10-CM

## 2019-04-23 LAB — POCT INR: INR: 2.3 (ref 2.0–3.0)

## 2019-04-23 NOTE — Patient Instructions (Signed)
Description   °Continue taking 5 mg daily except 7.5 mg on Tuesdays, Thursdays, and Saturdays. Recheck in 8 weeks. Coumadin Clinic [phone] Main [phone] °  ° °

## 2019-05-15 ENCOUNTER — Other Ambulatory Visit: Payer: Self-pay | Admitting: Cardiology

## 2019-06-18 ENCOUNTER — Other Ambulatory Visit: Payer: Self-pay

## 2019-06-18 ENCOUNTER — Ambulatory Visit: Payer: Medicare Other | Admitting: *Deleted

## 2019-06-18 DIAGNOSIS — I4891 Unspecified atrial fibrillation: Secondary | ICD-10-CM

## 2019-06-18 DIAGNOSIS — Z7901 Long term (current) use of anticoagulants: Secondary | ICD-10-CM

## 2019-06-18 DIAGNOSIS — Z5181 Encounter for therapeutic drug level monitoring: Secondary | ICD-10-CM | POA: Diagnosis not present

## 2019-06-18 LAB — POCT INR: INR: 2.1 (ref 2.0–3.0)

## 2019-06-18 NOTE — Patient Instructions (Signed)
Description   °Continue taking 5 mg daily except 7.5 mg on Tuesdays, Thursdays, and Saturdays. Recheck in 8 weeks. Coumadin Clinic [phone] Main [phone] °  ° °

## 2019-07-23 ENCOUNTER — Other Ambulatory Visit: Payer: Self-pay | Admitting: Cardiology

## 2019-08-13 ENCOUNTER — Other Ambulatory Visit: Payer: Self-pay

## 2019-08-13 ENCOUNTER — Ambulatory Visit: Payer: Medicare Other

## 2019-08-13 DIAGNOSIS — I4891 Unspecified atrial fibrillation: Secondary | ICD-10-CM

## 2019-08-13 DIAGNOSIS — Z7901 Long term (current) use of anticoagulants: Secondary | ICD-10-CM | POA: Diagnosis not present

## 2019-08-13 LAB — POCT INR: INR: 2.5 (ref 2.0–3.0)

## 2019-08-13 NOTE — Patient Instructions (Signed)
Continue taking 5 mg daily except 7.5 mg on Tuesdays, Thursdays, and Saturdays. Recheck in 8 weeks. Coumadin Clinic [phone] Main [phone]

## 2019-09-28 ENCOUNTER — Other Ambulatory Visit: Payer: Self-pay | Admitting: Cardiology

## 2019-10-08 ENCOUNTER — Other Ambulatory Visit: Payer: Self-pay

## 2019-10-08 ENCOUNTER — Ambulatory Visit: Payer: Medicare Other

## 2019-10-08 DIAGNOSIS — I4891 Unspecified atrial fibrillation: Secondary | ICD-10-CM

## 2019-10-08 DIAGNOSIS — Z7901 Long term (current) use of anticoagulants: Secondary | ICD-10-CM | POA: Diagnosis not present

## 2019-10-08 LAB — POCT INR: INR: 2.4 (ref 2.0–3.0)

## 2019-10-08 NOTE — Patient Instructions (Signed)
Description   °Continue taking 5 mg daily except 7.5 mg on Tuesdays, Thursdays, and Saturdays. Recheck in 8 weeks. Coumadin Clinic [phone] Main [phone] °  ° °

## 2019-10-24 ENCOUNTER — Other Ambulatory Visit: Payer: Self-pay | Admitting: Cardiology

## 2019-11-13 ENCOUNTER — Other Ambulatory Visit: Payer: Self-pay | Admitting: Cardiology

## 2019-11-13 MED ORDER — NITROGLYCERIN 0.4 MG SL SUBL: 0.4000 mg | SUBLINGUAL_TABLET | SUBLINGUAL | 6 refills | Status: DC | PRN

## 2019-11-13 NOTE — Telephone Encounter (Signed)
Rx has been sent to the pharmacy electronically. ° °

## 2019-11-13 NOTE — Telephone Encounter (Signed)
   *  STAT* If patient is at the pharmacy, call can be transferred to refill team.   1. Which medications need to be refilled? (please list name of each medication and dose if known) nitroGLYCERIN (NITROSTAT) 0.4 MG SL tablet  2. Which pharmacy/location (including street and city if local pharmacy) is medication to be sent to? [place] ([place]), [place] [address]  3. Do they need a 30 day or 90 day supply? 1 bottle

## 2019-12-03 ENCOUNTER — Other Ambulatory Visit: Payer: Self-pay

## 2019-12-03 ENCOUNTER — Ambulatory Visit: Payer: Medicare Other | Admitting: *Deleted

## 2019-12-03 DIAGNOSIS — Z7901 Long term (current) use of anticoagulants: Secondary | ICD-10-CM

## 2019-12-03 DIAGNOSIS — Z5181 Encounter for therapeutic drug level monitoring: Secondary | ICD-10-CM

## 2019-12-03 DIAGNOSIS — I4891 Unspecified atrial fibrillation: Secondary | ICD-10-CM | POA: Diagnosis not present

## 2019-12-03 LAB — POCT INR: INR: 2.3 (ref 2.0–3.0)

## 2019-12-03 NOTE — Patient Instructions (Signed)
Description   Continue taking 5 mg daily except 7.5 mg on Tuesdays, Thursdays, and Saturdays. Recheck in 8 weeks. Coumadin Clinic [phone] Main [phone]

## 2020-01-10 ENCOUNTER — Other Ambulatory Visit: Payer: Self-pay | Admitting: Cardiology

## 2020-01-22 NOTE — Progress Notes (Signed)
HPI: FU CAD and atrial fibrillation. Prior CABG with LIMA to LAD. Cath 2006 showed 100 LAD, 50 D1, 70 distal Lcx, 100 first OM, 75 prox RCA and 99 distal; LIMA patent; 60/70 distal LAD, normal EF. Had PCI of prox and distal RCA and first OM. Nuclear study 10/14 showed inferior defect with no ischemia; not gated. Echocardiogram October 2019 showed ejection fraction 45%, mildly dilated ascending aorta, severe biatrial enlargement, mild right ventricular enlargement with reduced function and severe pulmonary hypertension.  Abdominal ultrasound January 2020 showed no abdominal aortic aneurysm. Since last seen,patient denies dyspnea, chest pain, palpitations or syncope.  No bleeding.  Current Outpatient Medications  Medication Sig Dispense Refill  . Coenzyme Q10 (COQ10 PO) Take 100 mg by mouth daily.    . folic acid (FOLVITE) 800 MCG tablet Take 400 mcg by mouth daily. 1/2 tab daily    . furosemide (LASIX) 40 MG tablet Take 1 tablet (40 mg total) by mouth daily. Daily as needed for swelling 90 tablet 0  . HYDROcodone-acetaminophen (NORCO/VICODIN) 5-325 MG tablet Take 1 tablet by mouth as directed.    . metFORMIN (GLUCOPHAGE) 1000 MG tablet Take 1,000 mg by mouth 2 (two) times daily.    . metoprolol tartrate (LOPRESSOR) 25 MG tablet Take 1 tablet (25 mg total) by mouth 2 (two) times daily. 180 tablet 3  . Multiple Vitamin (MULTIVITAMIN) tablet Take 1 tablet by mouth daily.    . Multiple Vitamins-Minerals (PRESERVISION AREDS 2 PO) Take by mouth daily.    . nitroGLYCERIN (NITROSTAT) 0.4 MG SL tablet Place 1 tablet (0.4 mg total) under the tongue every 5 (five) minutes as needed (up to 3 tablets). 25 tablet 6  . pravastatin (PRAVACHOL) 40 MG tablet Take 40 mg by mouth at bedtime.     . quinapril (ACCUPRIL) 40 MG tablet Take 1 tablet by mouth once daily with breakfast 90 tablet 0  . Thiamine HCl (VITAMIN B-1) 100 MG tablet Take 100 mg by mouth daily.    . vitamin B-12 (CYANOCOBALAMIN) 250 MCG tablet  Take 250 mcg by mouth daily.    Marland Kitchen warfarin (COUMADIN) 5 MG tablet TAKE 1 TABLET BY MOUTH ONCE DAILY OR  AS DIRECTED  BY  COUMADIN  CLINIC 70 tablet 1  . warfarin (COUMADIN) 7.5 MG tablet TAKE AS DIRECTED BY COUMADIN CLINIC 30 tablet 0   No current facility-administered medications for this visit.     Past Medical History:  Diagnosis Date  . Acute MI (HCC)    subendocardial  . CAD (coronary artery disease)   . Chronic atrial fibrillation (HCC)   . Delirium   . DM2 (diabetes mellitus, type 2) (HCC)   . Drug therapy    coumadin  . HTN (hypertension)   . Hypernatremia   . Mixed hyperlipidemia   . Septic shock(785.52)     Past Surgical History:  Procedure Laterality Date  . CORONARY ARTERY BYPASS GRAFT  1994   LIMA to LAD per [facility]    Social History   Socioeconomic History  . Marital status: Married    Spouse name: Not on file  . Number of children: Not on file  . Years of education: Not on file  . Highest education level: Not on file  Occupational History  . Not on file  Tobacco Use  . Smoking status: Never Smoker  . Smokeless tobacco: Current User    Types: Chew  Substance and Sexual Activity  . Alcohol use: No    Alcohol/week:  0.0 standard drinks  . Drug use: No  . Sexual activity: Not Currently  Other Topics Concern  . Not on file  Social History Narrative   Married; retired; disabled, does not get regular exercise.    Social Determinants of Health   Financial Resource Strain: Not on file  Food Insecurity: Not on file  Transportation Needs: Not on file  Physical Activity: Not on file  Stress: Not on file  Social Connections: Not on file  Intimate Partner Violence: Not on file    Family History  Problem Relation Age of Onset  . Coronary artery disease Other        family hx  . Hypertension Other        family hx    ROS: no fevers or chills, productive cough, hemoptysis, dysphasia, odynophagia, melena, hematochezia, dysuria, hematuria, rash, seizure  activity, orthopnea, PND, pedal edema, claudication. Remaining systems are negative.  Physical Exam: Well-developed well-nourished in no acute distress.  Skin is warm and dry.  HEENT is normal.  Neck is supple.  Chest is clear to auscultation with normal expansion.  Cardiovascular exam is irregular, 2/6 systolic murmur left sternal border. Abdominal exam nontender or distended. No masses palpated. Extremities show 1+ edema right lower extremity neuro grossly intact  ECG-atrial fibrillation at a rate of 71, normal axis, cannot rule out septal infarct.  Personally reviewed  A/P  1 Permanent atrial fibrillation-continue metoprolol for rate control and coumadin; pt not interested in DOACs.  Check hemoglobin.  2 coronary artery disease-patient denies chest pain.  Continue statin.  He is not on aspirin given need for Coumadin.  3 hypertension-blood pressure elevated; add amlodipine 5 mg daily and follow.  Check potassium and renal function.  4 hyperlipidemia-continue statin.  He did not tolerate Crestor or Lipitor previously.  Check lipids and liver.  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  Continue fluid restriction and low-sodium diet.  6 cardiomyopathy-LV function reduced on previous echocardiogram.  We will repeat study.  Continue ACE inhibitor and beta-blocker.  Kirk Ruths, MD

## 2020-01-29 ENCOUNTER — Other Ambulatory Visit: Payer: Self-pay

## 2020-01-29 ENCOUNTER — Encounter: Payer: Self-pay | Admitting: Cardiology

## 2020-01-29 ENCOUNTER — Ambulatory Visit: Payer: Medicare Other | Admitting: Cardiology

## 2020-01-29 ENCOUNTER — Ambulatory Visit: Payer: Medicare Other | Admitting: Pharmacist Clinician (PhC)/ Clinical Pharmacy Specialist

## 2020-01-29 VITALS — BP 164/72 | HR 71 | Ht 70.5 in | Wt 203.0 lb

## 2020-01-29 DIAGNOSIS — Z5181 Encounter for therapeutic drug level monitoring: Secondary | ICD-10-CM

## 2020-01-29 DIAGNOSIS — I4891 Unspecified atrial fibrillation: Secondary | ICD-10-CM | POA: Diagnosis not present

## 2020-01-29 DIAGNOSIS — Z7901 Long term (current) use of anticoagulants: Secondary | ICD-10-CM | POA: Diagnosis not present

## 2020-01-29 DIAGNOSIS — E7800 Pure hypercholesterolemia, unspecified: Secondary | ICD-10-CM | POA: Diagnosis not present

## 2020-01-29 DIAGNOSIS — I2510 Atherosclerotic heart disease of native coronary artery without angina pectoris: Secondary | ICD-10-CM | POA: Diagnosis not present

## 2020-01-29 DIAGNOSIS — I428 Other cardiomyopathies: Secondary | ICD-10-CM

## 2020-01-29 DIAGNOSIS — I10 Essential (primary) hypertension: Secondary | ICD-10-CM

## 2020-01-29 LAB — POCT INR: INR: 1.3 — AB (ref 2.0–3.0)

## 2020-01-29 MED ORDER — AMLODIPINE BESYLATE 5 MG PO TABS: 5.0000 mg | ORAL_TABLET | Freq: Every day | ORAL | 3 refills | Status: DC

## 2020-01-29 NOTE — Patient Instructions (Signed)
Take 10 mg for 2 days, then continue taking 5 mg daily except 7.5 mg on Tuesdays, Thursdays, and Saturdays. Recheck in 2 weeks. [facility] [phone] Main [phone]

## 2020-01-29 NOTE — Patient Instructions (Signed)
Medication Instructions:   START AMLODIPINE 5 MG ONCE DAILY  *If you need a refill on your cardiac medications before your next appointment, please call your pharmacy*   Lab Work:  Your physician recommends that you HAVE LAB WORK TODAY  If you have labs (blood work) drawn today and your tests are completely normal, you will receive your results only by: Marland Kitchen MyChart Message (if you have MyChart) OR . A paper copy in the mail If you have any lab test that is abnormal or we need to change your treatment, we will call you to review the results.   Testing/Procedures:  Your physician has requested that you have an echocardiogram. Echocardiography is a painless test that uses sound waves to create images of your heart. It provides your doctor with information about the size and shape of your heart and how well your heart's chambers and valves are working. This procedure takes approximately one hour. There are no restrictions for this procedure.[address]     Follow-Up: At [facility], you and your health needs are our priority.  As part of our continuing mission to provide you with exceptional heart care, we have created designated Provider Care Teams.  These Care Teams include your primary Cardiologist (physician) and Advanced Practice Providers (APPs -  Physician Assistants and Nurse Practitioners) who all work together to provide you with the care you need, when you need it.  We recommend signing up for the patient portal called "MyChart".  Sign up information is provided on this After Visit Summary.  MyChart is used to connect with patients for Virtual Visits (Telemedicine).  Patients are able to view lab/test results, encounter notes, upcoming appointments, etc.  Non-urgent messages can be sent to your provider as well.   To learn more about what you can do with MyChart, go to [email or web address].    Your next appointment:   12 month(s)  The format for your next  appointment:   In Person  Provider:   Olga Millers, MD

## 2020-01-30 ENCOUNTER — Encounter: Payer: Self-pay | Admitting: *Deleted

## 2020-01-30 LAB — COMPREHENSIVE METABOLIC PANEL
ALT: 13 IU/L (ref 0–44)
AST: 18 IU/L (ref 0–40)
Albumin/Globulin Ratio: 1.9 (ref 1.2–2.2)
Albumin: 4.2 g/dL (ref 3.6–4.6)
Alkaline Phosphatase: 54 IU/L (ref 44–121)
BUN/Creatinine Ratio: 21 (ref 10–24)
BUN: 24 mg/dL (ref 8–27)
Bilirubin Total: 0.8 mg/dL (ref 0.0–1.2)
CO2: 25 mmol/L (ref 20–29)
Calcium: 9.6 mg/dL (ref 8.6–10.2)
Chloride: 107 mmol/L — ABNORMAL HIGH (ref 96–106)
Creatinine, Ser: 1.12 mg/dL (ref 0.76–1.27)
GFR calc Af Amer: 71 mL/min/{1.73_m2} (ref >59)
GFR calc non Af Amer: 61 mL/min/{1.73_m2} (ref >59)
Globulin, Total: 2.2 g/dL (ref 1.5–4.5)
Glucose: 98 mg/dL (ref 65–99)
Potassium: 4.5 mmol/L (ref 3.5–5.2)
Sodium: 145 mmol/L — ABNORMAL HIGH (ref 134–144)
Total Protein: 6.4 g/dL (ref 6.0–8.5)

## 2020-01-30 LAB — CBC
Hematocrit: 36.8 % — ABNORMAL LOW (ref 37.5–51.0)
Hemoglobin: 11.7 g/dL — ABNORMAL LOW (ref 13.0–17.7)
MCH: 30.5 pg (ref 26.6–33.0)
MCHC: 31.8 g/dL (ref 31.5–35.7)
MCV: 96 fL (ref 79–97)
Platelets: 130 10*3/uL — ABNORMAL LOW (ref 150–450)
RBC: 3.84 x10E6/uL — ABNORMAL LOW (ref 4.14–5.80)
RDW: 18.6 % — ABNORMAL HIGH (ref 11.6–15.4)
WBC: 7.9 10*3/uL (ref 3.4–10.8)

## 2020-01-30 LAB — LIPID PANEL
Chol/HDL Ratio: 2.6 ratio (ref 0.0–5.0)
Cholesterol, Total: 103 mg/dL (ref 100–199)
HDL: 40 mg/dL (ref >39)
LDL Chol Calc (NIH): 44 mg/dL (ref 0–99)
Triglycerides: 99 mg/dL (ref 0–149)
VLDL Cholesterol Cal: 19 mg/dL (ref 5–40)

## 2020-02-07 ENCOUNTER — Other Ambulatory Visit: Payer: Self-pay | Admitting: Cardiology

## 2020-02-19 ENCOUNTER — Other Ambulatory Visit: Payer: Self-pay

## 2020-02-19 ENCOUNTER — Ambulatory Visit (HOSPITAL_COMMUNITY): Payer: Medicare Other | Attending: Internal Medicine

## 2020-02-19 ENCOUNTER — Ambulatory Visit (INDEPENDENT_AMBULATORY_CARE_PROVIDER_SITE_OTHER): Payer: Medicare Other | Admitting: *Deleted

## 2020-02-19 DIAGNOSIS — I428 Other cardiomyopathies: Secondary | ICD-10-CM | POA: Diagnosis present

## 2020-02-19 DIAGNOSIS — Z5181 Encounter for therapeutic drug level monitoring: Secondary | ICD-10-CM

## 2020-02-19 DIAGNOSIS — Z7901 Long term (current) use of anticoagulants: Secondary | ICD-10-CM

## 2020-02-19 DIAGNOSIS — I4891 Unspecified atrial fibrillation: Secondary | ICD-10-CM | POA: Diagnosis not present

## 2020-02-19 LAB — ECHOCARDIOGRAM COMPLETE
Area-P 1/2: 4.68 cm2
S' Lateral: 3.6 cm

## 2020-02-19 LAB — POCT INR: INR: 2.4 (ref 2.0–3.0)

## 2020-02-19 NOTE — Patient Instructions (Signed)
Description   Continue taking 5 mg daily except 7.5 mg on Tuesdays, Thursdays, and Saturdays. Recheck in 4 weeks. [facility] [phone] Main [phone]     

## 2020-03-18 ENCOUNTER — Ambulatory Visit (INDEPENDENT_AMBULATORY_CARE_PROVIDER_SITE_OTHER): Payer: Medicare Other | Admitting: *Deleted

## 2020-03-18 ENCOUNTER — Other Ambulatory Visit: Payer: Self-pay

## 2020-03-18 DIAGNOSIS — Z7901 Long term (current) use of anticoagulants: Secondary | ICD-10-CM

## 2020-03-18 DIAGNOSIS — I4891 Unspecified atrial fibrillation: Secondary | ICD-10-CM | POA: Diagnosis not present

## 2020-03-18 LAB — POCT INR: INR: 2.1 (ref 2.0–3.0)

## 2020-03-18 NOTE — Patient Instructions (Signed)
Description   Continue taking Warfarin 5 mg daily except 7.5 mg on Tuesdays, Thursdays, and Saturdays. Recheck in 5 weeks. [facility] [phone] Main [phone]

## 2020-04-17 ENCOUNTER — Other Ambulatory Visit: Payer: Self-pay | Admitting: Cardiology

## 2020-04-22 ENCOUNTER — Other Ambulatory Visit: Payer: Self-pay

## 2020-04-22 ENCOUNTER — Ambulatory Visit (INDEPENDENT_AMBULATORY_CARE_PROVIDER_SITE_OTHER): Payer: Medicare Other

## 2020-04-22 DIAGNOSIS — I4891 Unspecified atrial fibrillation: Secondary | ICD-10-CM | POA: Diagnosis not present

## 2020-04-22 DIAGNOSIS — Z7901 Long term (current) use of anticoagulants: Secondary | ICD-10-CM

## 2020-04-22 LAB — POCT INR: INR: 1.5 — AB (ref 2.0–3.0)

## 2020-04-22 NOTE — Patient Instructions (Signed)
Description   Take 10mg  today and 7.5mg  tomorrow, then resume same dosage Warfarin 5 mg daily except 7.5 mg on Tuesdays, Thursdays, and Saturdays. Recheck in 2 weeks. [facility] [phone] Main [phone]

## 2020-05-06 ENCOUNTER — Other Ambulatory Visit: Payer: Self-pay

## 2020-05-06 ENCOUNTER — Ambulatory Visit (INDEPENDENT_AMBULATORY_CARE_PROVIDER_SITE_OTHER): Payer: Medicare Other | Admitting: Pharmacist

## 2020-05-06 DIAGNOSIS — I4891 Unspecified atrial fibrillation: Secondary | ICD-10-CM

## 2020-05-06 DIAGNOSIS — Z7901 Long term (current) use of anticoagulants: Secondary | ICD-10-CM

## 2020-05-06 LAB — POCT INR: INR: 2.6 (ref 2.0–3.0)

## 2020-05-06 NOTE — Patient Instructions (Signed)
Description   Continue taking Warfarin 5 mg daily except 7.5 mg on Tuesdays, Thursdays, and Saturdays. Recheck in 4 weeks. [facility] [phone] Main [phone]

## 2020-05-12 ENCOUNTER — Other Ambulatory Visit: Payer: Self-pay | Admitting: Cardiology

## 2020-06-03 ENCOUNTER — Ambulatory Visit (INDEPENDENT_AMBULATORY_CARE_PROVIDER_SITE_OTHER): Payer: Medicare Other | Admitting: *Deleted

## 2020-06-03 ENCOUNTER — Other Ambulatory Visit: Payer: Self-pay

## 2020-06-03 DIAGNOSIS — Z7901 Long term (current) use of anticoagulants: Secondary | ICD-10-CM

## 2020-06-03 DIAGNOSIS — I4891 Unspecified atrial fibrillation: Secondary | ICD-10-CM

## 2020-06-03 LAB — POCT INR: INR: 2.5 (ref 2.0–3.0)

## 2020-06-03 NOTE — Patient Instructions (Signed)
Description   Continue taking Warfarin 5 mg daily except 7.5 mg on Tuesdays, Thursdays, and Saturdays. Recheck in 5 weeks. [facility] [phone] Main [phone]     

## 2020-07-02 ENCOUNTER — Other Ambulatory Visit: Payer: Self-pay | Admitting: Cardiology

## 2020-07-08 ENCOUNTER — Ambulatory Visit (INDEPENDENT_AMBULATORY_CARE_PROVIDER_SITE_OTHER): Payer: Medicare Other | Admitting: *Deleted

## 2020-07-08 ENCOUNTER — Other Ambulatory Visit: Payer: Self-pay

## 2020-07-08 DIAGNOSIS — Z7901 Long term (current) use of anticoagulants: Secondary | ICD-10-CM

## 2020-07-08 DIAGNOSIS — I4891 Unspecified atrial fibrillation: Secondary | ICD-10-CM

## 2020-07-08 LAB — POCT INR: INR: 2.6 (ref 2.0–3.0)

## 2020-07-08 NOTE — Patient Instructions (Signed)
Description   Continue taking Warfarin 5 mg daily except 7.5 mg on Tuesdays, Thursdays, and Saturdays. Recheck in 6 weeks. [facility] [phone] Main [phone]

## 2020-08-19 ENCOUNTER — Other Ambulatory Visit: Payer: Self-pay

## 2020-08-19 ENCOUNTER — Encounter (INDEPENDENT_AMBULATORY_CARE_PROVIDER_SITE_OTHER): Payer: Self-pay

## 2020-08-19 ENCOUNTER — Ambulatory Visit (INDEPENDENT_AMBULATORY_CARE_PROVIDER_SITE_OTHER): Payer: Medicare Other

## 2020-08-19 DIAGNOSIS — I4891 Unspecified atrial fibrillation: Secondary | ICD-10-CM

## 2020-08-19 DIAGNOSIS — Z7901 Long term (current) use of anticoagulants: Secondary | ICD-10-CM

## 2020-08-19 LAB — POCT INR: INR: 2.2 (ref 2.0–3.0)

## 2020-08-19 NOTE — Patient Instructions (Signed)
Description   Continue taking Warfarin 5 mg daily except 7.5 mg on Tuesdays, Thursdays, and Saturdays. Recheck in 6 weeks. [facility] [phone] Main [phone]

## 2020-09-30 ENCOUNTER — Ambulatory Visit (INDEPENDENT_AMBULATORY_CARE_PROVIDER_SITE_OTHER): Payer: Medicare Other

## 2020-09-30 ENCOUNTER — Other Ambulatory Visit: Payer: Self-pay

## 2020-09-30 DIAGNOSIS — I4891 Unspecified atrial fibrillation: Secondary | ICD-10-CM

## 2020-09-30 DIAGNOSIS — Z7901 Long term (current) use of anticoagulants: Secondary | ICD-10-CM

## 2020-09-30 DIAGNOSIS — Z5181 Encounter for therapeutic drug level monitoring: Secondary | ICD-10-CM

## 2020-09-30 LAB — POCT INR: INR: 2.6 (ref 2.0–3.0)

## 2020-09-30 NOTE — Patient Instructions (Signed)
Description   Continue taking Warfarin 5 mg daily except 7.5 mg on Tuesdays, Thursdays, and Saturdays. Recheck in 6 weeks. [facility] [phone] Main [phone]

## 2020-10-08 ENCOUNTER — Other Ambulatory Visit: Payer: Self-pay | Admitting: Cardiology

## 2020-11-11 ENCOUNTER — Ambulatory Visit: Payer: Medicare Other

## 2020-11-11 ENCOUNTER — Other Ambulatory Visit: Payer: Self-pay

## 2020-11-11 DIAGNOSIS — Z7901 Long term (current) use of anticoagulants: Secondary | ICD-10-CM | POA: Diagnosis not present

## 2020-11-11 DIAGNOSIS — I4891 Unspecified atrial fibrillation: Secondary | ICD-10-CM | POA: Diagnosis not present

## 2020-11-11 LAB — POCT INR: INR: 3.9 — AB (ref 2.0–3.0)

## 2020-11-11 NOTE — Patient Instructions (Signed)
Description   Skip today's dosage of Warfarin, then start taking  5 mg daily except 7.5 mg on Tuesdays and Saturdays. Recheck in 3 weeks. [facility] [phone] Main [phone]

## 2020-11-12 ENCOUNTER — Telehealth: Payer: Self-pay | Admitting: Hematology and Oncology

## 2020-11-12 NOTE — Telephone Encounter (Signed)
Scheduled appt per 10/18 referral. Pt's wife is aware of appt date and time.

## 2020-11-24 NOTE — Progress Notes (Signed)
Patient Care Team: Leonard Downing, MD as PCP - General (Family Medicine)  DIAGNOSIS:    ICD-10-CM   1. Anemia, unspecified type  D64.9 CBC with Differential (Cancer Center Only)    Haptoglobin    Ferritin    Folate    Vitamin B12    Sample to Blood Bank    Iron and TIBC    Retic Panel    Direct antiglobulin test ([facility])    CMP ([place] only)    Erythropoietin    2. Normochromic normocytic anemia  D64.9       CHIEF COMPLIANT: Follow-up of macrocytic anemia  INTERVAL HISTORY: Jordan Clayton is a 80 y.o. with above-mentioned history of macrocytic anemia. He presents to the clinic today for follow-up because recent blood work on 10/16/2020 showed a hemoglobin of 8.9.  His wife tells me that he has been progressively declining with worsening energy levels worsening shortness of breath with exertion and is currently in a wheelchair.  He tells me that his stools are dark in color.  He feels dizzy when he stands up.  ALLERGIES:  is allergic to atorvastatin, oxycodone-acetaminophen, quetiapine, and rosuvastatin.  MEDICATIONS:  Current Outpatient Medications  Medication Sig Dispense Refill   amLODipine (NORVASC) 5 MG tablet Take 1 tablet (5 mg total) by mouth daily. 90 tablet 3   Coenzyme Q10 (COQ10 PO) Take 100 mg by mouth daily.     folic acid (FOLVITE) 149 MCG tablet Take 400 mcg by mouth daily. 1/2 tab daily     furosemide (LASIX) 40 MG tablet Take 1 tablet (40 mg total) by mouth daily. Daily as needed for swelling 90 tablet 0   HYDROcodone-acetaminophen (NORCO/VICODIN) 5-325 MG tablet Take 1 tablet by mouth as directed.     metFORMIN (GLUCOPHAGE) 1000 MG tablet Take 1,000 mg by mouth 2 (two) times daily.     metoprolol tartrate (LOPRESSOR) 25 MG tablet Take 1 tablet by mouth twice daily 180 tablet 2   Multiple Vitamin (MULTIVITAMIN) tablet Take 1 tablet by mouth daily.     Multiple Vitamins-Minerals (PRESERVISION AREDS 2 PO) Take by mouth daily.     nitroGLYCERIN  (NITROSTAT) 0.4 MG SL tablet Place 1 tablet (0.4 mg total) under the tongue every 5 (five) minutes as needed (up to 3 tablets). 25 tablet 6   pravastatin (PRAVACHOL) 40 MG tablet Take 40 mg by mouth at bedtime.      quinapril (ACCUPRIL) 40 MG tablet Take 1 tablet by mouth once daily with breakfast 90 tablet 3   Thiamine HCl (VITAMIN B-1) 100 MG tablet Take 100 mg by mouth daily.     vitamin B-12 (CYANOCOBALAMIN) 250 MCG tablet Take 250 mcg by mouth daily.     warfarin (COUMADIN) 5 MG tablet TAKE 1 TABLET BY MOUTH ONCE DAILY OR AS DIRECTED BY COUMADIN CLINIC 70 tablet 1   warfarin (COUMADIN) 7.5 MG tablet USE AS DIRECTED BY  COUMADIN  CLINIC 30 tablet 1   No current facility-administered medications for this visit.    PHYSICAL EXAMINATION: ECOG PERFORMANCE STATUS: 3 - Symptomatic, >50% confined to bed  Vitals:   11/25/20 1142  BP: (!) 107/54  Pulse: 86  Resp: 18  Temp: 98.1 F (36.7 C)  SpO2: 93%   Filed Weights   11/25/20 1142  Weight: 188 lb 1.6 oz (85.3 kg)    LABORATORY DATA:  I have reviewed the data as listed CMP Latest Ref Rng & Units 01/29/2020 01/11/2019 03/01/2018  Glucose 65 - 99  mg/dL 98 128(H) 107(H)  BUN 8 - 27 mg/dL 24 25 22   Creatinine 0.76 - 1.27 mg/dL 1.12 1.14 1.00  Sodium 134 - 144 mmol/L 145(H) 145(H) 143  Potassium 3.5 - 5.2 mmol/L 4.5 4.8 4.2  Chloride 96 - 106 mmol/L 107(H) 104 108  CO2 20 - 29 mmol/L 25 24 30   Calcium 8.6 - 10.2 mg/dL 9.6 10.0 9.6  Total Protein 6.0 - 8.5 g/dL 6.4 6.4 7.0  Total Bilirubin 0.0 - 1.2 mg/dL 0.8 1.2 1.5(H)  Alkaline Phos 44 - 121 IU/L 54 52 47  AST 0 - 40 IU/L 18 20 18   ALT 0 - 44 IU/L 13 11 13     Lab Results  Component Value Date   WBC 9.8 11/25/2020   HGB 6.8 (LL) 11/25/2020   HCT 21.8 (L) 11/25/2020   MCV 97.3 11/25/2020   PLT 71 (L) 11/25/2020   NEUTROABS PENDING 11/25/2020    ASSESSMENT & PLAN:  Normochromic normocytic anemia Severe normocytic anemia: 11/15/2019: Hemoglobin 11.7 10/16/2020: Hemoglobin  8.9 11/25/2020: Hemoglobin 6.8  Severe symptomatic anemia: We recommend giving 2 units of PRBC today. We need to investigate the cause of his anemia. I would like to refer him to a gastroenterologist for endoscopies given the fact that he is on Coumadin and he has noticed that his stools are dark in color.  Additional lab work including CMP, O96, folic acid, iron studies, ferritin, reticulocyte, DAT,  and haptoglobin are pending.  I discussed with him about sending to the emergency room and or hospitalization.  Given the lack of beds available, we will try to manage him as an outpatient.  If his symptoms get worse in spite of blood transfusion he may have to go there.  Recheck labs and follow-up in 1 week   Orders Placed This Encounter  Procedures   CBC with Differential ([place] Only)    Standing Status:   Future    Number of Occurrences:   1    Standing Expiration Date:   11/25/2021   Haptoglobin    Standing Status:   Future    Number of Occurrences:   1    Standing Expiration Date:   11/25/2021   Ferritin    Standing Status:   Future    Number of Occurrences:   1    Standing Expiration Date:   11/25/2021   Folate    Standing Status:   Future    Number of Occurrences:   1    Standing Expiration Date:   11/25/2021   Vitamin B12    Standing Status:   Future    Number of Occurrences:   1    Standing Expiration Date:   11/25/2021   Iron and TIBC    Standing Status:   Future    Number of Occurrences:   1    Standing Expiration Date:   11/25/2021   Retic Panel    Standing Status:   Future    Number of Occurrences:   1    Standing Expiration Date:   11/25/2021   CMP ([place] only)    Standing Status:   Future    Number of Occurrences:   1    Standing Expiration Date:   11/25/2021   Erythropoietin    Standing Status:   Future    Number of Occurrences:   1    Standing Expiration Date:   11/25/2021   Sample to Blood Bank    Standing Status:   Future  Number of  Occurrences:   1    Standing Expiration Date:   11/25/2021   Direct antiglobulin test ([facility])    Standing Status:   Future    Number of Occurrences:   1    Standing Expiration Date:   11/25/2021    The patient has a good understanding of the overall plan. he agrees with it. he will call with any problems that may develop before the next visit here.  Total time spent: 30 mins including face to face time and time spent for planning, charting and coordination of care  Rulon Eisenmenger, MD, MPH 11/25/2020  I, Thana Ates, am acting as scribe for Dr. Nicholas Lose.  I have reviewed the above documentation for accuracy and completeness, and I agree with the above.

## 2020-11-25 ENCOUNTER — Other Ambulatory Visit: Payer: Self-pay

## 2020-11-25 ENCOUNTER — Inpatient Hospital Stay: Payer: Medicare Other

## 2020-11-25 ENCOUNTER — Telehealth: Payer: Self-pay

## 2020-11-25 ENCOUNTER — Other Ambulatory Visit: Payer: Self-pay | Admitting: Hematology and Oncology

## 2020-11-25 ENCOUNTER — Inpatient Hospital Stay: Payer: Medicare Other | Attending: Hematology and Oncology | Admitting: Hematology and Oncology

## 2020-11-25 VITALS — BP 107/54 | HR 86 | Temp 98.1°F | Resp 18 | Ht 70.5 in | Wt 188.1 lb

## 2020-11-25 DIAGNOSIS — D649 Anemia, unspecified: Secondary | ICD-10-CM

## 2020-11-25 DIAGNOSIS — D46C Myelodysplastic syndrome with isolated del(5q) chromosomal abnormality: Secondary | ICD-10-CM | POA: Diagnosis not present

## 2020-11-25 LAB — CMP (CANCER CENTER ONLY)
ALT: 14 U/L (ref 0–44)
AST: 19 U/L (ref 15–41)
Albumin: 3.4 g/dL — ABNORMAL LOW (ref 3.5–5.0)
Alkaline Phosphatase: 73 U/L (ref 38–126)
Anion gap: 9 (ref 5–15)
BUN: 32 mg/dL — ABNORMAL HIGH (ref 8–23)
CO2: 24 mmol/L (ref 22–32)
Calcium: 9 mg/dL (ref 8.9–10.3)
Chloride: 105 mmol/L (ref 98–111)
Creatinine: 1.33 mg/dL — ABNORMAL HIGH (ref 0.61–1.24)
GFR, Estimated: 53 mL/min — ABNORMAL LOW (ref >60)
Glucose, Bld: 123 mg/dL — ABNORMAL HIGH (ref 70–99)
Potassium: 4.1 mmol/L (ref 3.5–5.1)
Sodium: 138 mmol/L (ref 135–145)
Total Bilirubin: 2.8 mg/dL — ABNORMAL HIGH (ref 0.3–1.2)
Total Protein: 6.9 g/dL (ref 6.5–8.1)

## 2020-11-25 LAB — CBC WITH DIFFERENTIAL (CANCER CENTER ONLY)
Abs Immature Granulocytes: 0.72 10*3/uL — ABNORMAL HIGH (ref 0.00–0.07)
Basophils Absolute: 0.1 10*3/uL (ref 0.0–0.1)
Basophils Relative: 1 %
Eosinophils Absolute: 0 10*3/uL (ref 0.0–0.5)
Eosinophils Relative: 0 %
HCT: 21.8 % — ABNORMAL LOW (ref 39.0–52.0)
Hemoglobin: 6.8 g/dL — CL (ref 13.0–17.0)
Immature Granulocytes: 7 %
Lymphocytes Relative: 10 %
Lymphs Abs: 1 10*3/uL (ref 0.7–4.0)
MCH: 30.4 pg (ref 26.0–34.0)
MCHC: 31.2 g/dL (ref 30.0–36.0)
MCV: 97.3 fL (ref 80.0–100.0)
Monocytes Absolute: 0.9 10*3/uL (ref 0.1–1.0)
Monocytes Relative: 9 %
Neutro Abs: 7.1 10*3/uL (ref 1.7–7.7)
Neutrophils Relative %: 73 %
Platelet Count: 71 10*3/uL — ABNORMAL LOW (ref 150–400)
RBC: 2.24 MIL/uL — ABNORMAL LOW (ref 4.22–5.81)
RDW: 30.2 % — ABNORMAL HIGH (ref 11.5–15.5)
WBC Count: 9.8 10*3/uL (ref 4.0–10.5)
nRBC: 1.5 % — ABNORMAL HIGH (ref 0.0–0.2)

## 2020-11-25 LAB — IRON AND TIBC
Iron: 32 ug/dL — ABNORMAL LOW (ref 42–163)
Saturation Ratios: 16 % — ABNORMAL LOW (ref 20–55)
TIBC: 205 ug/dL (ref 202–409)
UIBC: 173 ug/dL (ref 117–376)

## 2020-11-25 LAB — DIRECT ANTIGLOBULIN TEST (NOT AT ARMC)
DAT, IgG: NEGATIVE
DAT, complement: NEGATIVE

## 2020-11-25 LAB — RETIC PANEL
Immature Retic Fract: 22.2 % — ABNORMAL HIGH (ref 2.3–15.9)
RBC.: 2.16 MIL/uL — ABNORMAL LOW (ref 4.22–5.81)
Retic Count, Absolute: 66.3 10*3/uL (ref 19.0–186.0)
Retic Ct Pct: 3.1 % (ref 0.4–3.1)
Reticulocyte Hemoglobin: 28.9 pg (ref >27.9)

## 2020-11-25 LAB — BILIRUBIN, TOTAL AND DIRECT (CANCER CENTER ONLY)
Bilirubin, Direct: 0.4 mg/dL — ABNORMAL HIGH (ref 0.0–0.2)
Indirect Bilirubin: 2.1 mg/dL — ABNORMAL HIGH (ref 0.3–0.9)
Total Bilirubin: 2.5 mg/dL — ABNORMAL HIGH (ref 0.3–1.2)

## 2020-11-25 LAB — VITAMIN B12: Vitamin B-12: 618 pg/mL (ref 180–914)

## 2020-11-25 LAB — SAMPLE TO BLOOD BANK

## 2020-11-25 LAB — PREPARE RBC (CROSSMATCH)

## 2020-11-25 LAB — FOLATE: Folate: 43.4 ng/mL (ref >5.9)

## 2020-11-25 LAB — FERRITIN: Ferritin: 665 ng/mL — ABNORMAL HIGH (ref 24–336)

## 2020-11-25 MED ORDER — ACETAMINOPHEN 325 MG PO TABS
650.0000 mg | ORAL_TABLET | Freq: Once | ORAL | Status: AC
  Administered 2020-11-25: 650 mg via ORAL
  Filled 2020-11-25: qty 2

## 2020-11-25 MED ORDER — SODIUM CHLORIDE 0.9% IV SOLUTION
250.0000 mL | Freq: Once | INTRAVENOUS | Status: AC
  Administered 2020-11-25: 250 mL via INTRAVENOUS

## 2020-11-25 MED ORDER — DIPHENHYDRAMINE HCL 25 MG PO CAPS
25.0000 mg | ORAL_CAPSULE | Freq: Once | ORAL | Status: AC
  Administered 2020-11-25: 25 mg via ORAL
  Filled 2020-11-25: qty 1

## 2020-11-25 NOTE — Progress Notes (Signed)
Orders placed per MD, confirmed with BB prepare order. Charge RN aware.

## 2020-11-25 NOTE — Assessment & Plan Note (Addendum)
Severe normocytic anemia: 11/15/2019: Hemoglobin 11.7 10/16/2020: Hemoglobin 8.9 11/25/2020: Hemoglobin 6.8  Severe symptomatic anemia: We recommend giving 2 units of PRBC today. We need to investigate the cause of his anemia. I would like to refer him to a gastroenterologist for endoscopies given the fact that he is on Coumadin and he has noticed that his stools are dark in color.  Additional lab work including CMP, T21, folic acid, iron studies, ferritin, reticulocyte, DAT,  and haptoglobin are pending.  I discussed with him about sending to the emergency room and or hospitalization.  Given the lack of beds available, we will try to manage him as an outpatient.  If his symptoms get worse in spite of blood transfusion he may have to go there.

## 2020-11-25 NOTE — Telephone Encounter (Signed)
Pt's wife called, states pt is currently at the cancer center with a Hgb of 6.8, pt has been treated in the past for macrocytic anemia.  Pt is going to be transfused 2 PRBC's today, due to lack of beds in hospital trying to manage as outpt.  They are going to refer pt to GI for endoscopies given the fact that he is on Coumadin and he has noticed that his stools are dark in color.  No INR was drawn today with multiple other labs.  Wife is calling asking if pt should hold his Warfarin. Advised pt's wife I will forward message to Dr Stanford Breed and his nurse to advise of their recommendations. Will await response and call wife back with recommendations.  Pt has not taken his Warfarin today, normal dosage 5mg  QD 7.5mg  Tu, Sa.  Last INR 3.9 on 10/18, Warfarin was held and dosage lowered.

## 2020-11-25 NOTE — Telephone Encounter (Signed)
Called spoke with pt's wife Jordan Clayton, advised per Dr Stanford Breed OK to hold Warfarin until GI evaluation, resume when sure pt not bleeding.  Pt's wife does not know when pt will be scheduled to see GI.  Pt was only able to get 1 unit of PRBC today and is going to return to the cancer center tomorrow to get the second unit.  Pt's wife verbalized understanding and will hold pt's Warfarin for now.

## 2020-11-25 NOTE — Progress Notes (Signed)
Preblood vital signs Temp 100.9 orally. Dr. Lindi Adie aware. OK to proceed with blood transfusion

## 2020-11-25 NOTE — Patient Instructions (Signed)
Blood Transfusion, Adult °A blood transfusion is a procedure in which you receive blood through an IV tube. You may need this procedure because of: °A bleeding disorder. °An illness. °An injury. °A surgery. °The blood may come from someone else (a donor). You may also be able to donate blood for yourself. The blood given in a transfusion is made up of different types of cells. You may get: °Red blood cells. These carry oxygen to the cells in the body. °White blood cells. These help you fight infections. °Platelets. These help your blood to clot. °Plasma. This is the liquid part of your blood. It carries proteins and other substances through the body. °If you have a clotting disorder, you may also get other types of blood products. °Tell your doctor about: °Any blood disorders you have. °Any reactions you have had during a blood transfusion in the past. °Any allergies you have. °All medicines you are taking, including vitamins, herbs, eye drops, creams, and over-the-counter medicines. °Any surgeries you have had. °Any medical conditions you have. This includes any recent fever or cold symptoms. °Whether you are pregnant or may be pregnant. °What are the risks? °Generally, this is a safe procedure. However, problems may occur. °The most common problems include: °A mild allergic reaction. This includes red, swollen areas of skin (hives) and itching. °Fever or chills. This may be the body's response to new blood cells received. This may happen during or up to 4 hours after the transfusion. °More serious problems may include: °Too much fluid in the lungs. This may cause breathing problems. °A serious allergic reaction. This includes breathing trouble or swelling around the face and lips. °Lung injury. This causes breathing trouble and low oxygen in the blood. This can happen within hours of the transfusion or days later. °Too much iron. This can happen after getting many blood transfusions over a period of time. °An  infection or virus passed through the blood. This is rare. Donated blood is carefully tested before it is given. °Your body's defense system (immune system) trying to attack the new blood cells. This is rare. Symptoms may include fever, chills, nausea, low blood pressure, and low back or chest pain. °Donated cells attacking healthy tissues. This is rare. °What happens before the procedure? °Medicines °Ask your doctor about: °Changing or stopping your normal medicines. This is important. °Taking aspirin and ibuprofen. Do not take these medicines unless your doctor tells you to take them. °Taking over-the-counter medicines, vitamins, herbs, and supplements. °General instructions °Follow instructions from your doctor about what you cannot eat or drink. °You will have a blood test to find out your blood type. The test also finds out what type of blood your body will accept and matches it to the donor type. °If you are going to have a planned surgery, you may be able to donate your own blood. This may be done in case you need a transfusion. °You will have your temperature, blood pressure, and pulse checked. °You may receive medicine to help prevent an allergic reaction. This may be done if you have had a reaction to a transfusion before. This medicine may be given to you by mouth or through an IV tube. °This procedure lasts about 1-4 hours. Plan for the time you need. °What happens during the procedure? ° °An IV tube will be put into one of your veins. °The bag of donated blood will be attached to your IV tube. Then, the blood will enter through your vein. °Your temperature,   blood pressure, and pulse will be checked often. This is done to find early signs of a transfusion reaction. °Tell your nurse right away if you have any of these symptoms: °Shortness of breath or trouble breathing. °Chest or back pain. °Fever or chills. °Red, swollen areas of skin or itching. °If you have any signs or symptoms of a reaction, your  transfusion will be stopped. You may also be given medicine. °When the transfusion is finished, your IV tube will be taken out. °Pressure may be put on the IV site for a few minutes. °A bandage (dressing) will be put on the IV site. °The procedure may vary among doctors and hospitals. °What happens after the procedure? °You will be monitored until you leave the hospital or clinic. This includes checking your temperature, blood pressure, pulse, breathing rate, and blood oxygen level. °Your blood may be tested to see how you are responding to the transfusion. °You may be warmed with fluids or blankets. This is done to keep the temperature of your body normal. °If you have your procedure in an outpatient setting, you will be told whom to contact to report any reactions. °Where to find more information °To learn more, visit the [employer]: redcross.org °Summary °A blood transfusion is a procedure in which you are given blood through an IV tube. °The blood may come from someone else (a donor). You may also be able to donate blood for yourself. °The blood you are given is made up of different blood cells. You may receive red blood cells, platelets, plasma, or white blood cells. °Your temperature, blood pressure, and pulse will be checked often. °After the procedure, your blood may be tested to see how you are responding. °This information is not intended to replace advice given to you by your health care provider. Make sure you discuss any questions you have with your health care provider. °Document Revised: 07/06/2018 Document Reviewed: 07/06/2018 °Elsevier Patient Education © 2022 [employer]. ° °

## 2020-11-26 ENCOUNTER — Inpatient Hospital Stay: Payer: Medicare Other

## 2020-11-26 ENCOUNTER — Other Ambulatory Visit: Payer: Self-pay | Admitting: *Deleted

## 2020-11-26 DIAGNOSIS — D46C Myelodysplastic syndrome with isolated del(5q) chromosomal abnormality: Secondary | ICD-10-CM | POA: Diagnosis not present

## 2020-11-26 DIAGNOSIS — D649 Anemia, unspecified: Secondary | ICD-10-CM

## 2020-11-26 LAB — HAPTOGLOBIN: Haptoglobin: 209 mg/dL (ref 38–329)

## 2020-11-26 LAB — ERYTHROPOIETIN: Erythropoietin: 249.7 m[IU]/mL — ABNORMAL HIGH (ref 2.6–18.5)

## 2020-11-26 MED ORDER — SODIUM CHLORIDE 0.9% IV SOLUTION
250.0000 mL | Freq: Once | INTRAVENOUS | Status: AC
  Administered 2020-11-26: 250 mL via INTRAVENOUS

## 2020-11-26 MED ORDER — ACETAMINOPHEN 325 MG PO TABS
650.0000 mg | ORAL_TABLET | Freq: Once | ORAL | Status: AC
  Administered 2020-11-26: 650 mg via ORAL
  Filled 2020-11-26: qty 2

## 2020-11-26 MED ORDER — DIPHENHYDRAMINE HCL 25 MG PO CAPS
25.0000 mg | ORAL_CAPSULE | Freq: Once | ORAL | Status: AC
  Administered 2020-11-26: 25 mg via ORAL
  Filled 2020-11-26: qty 1

## 2020-11-26 NOTE — Patient Instructions (Signed)
Blood Transfusion, Adult, Care After This sheet gives you information about how to care for yourself after your procedure. Your doctor may also give you more specific instructions. If you have problems or questions, contact your doctor. What can I expect after the procedure? After the procedure, it is common to have: Bruising and soreness at the IV site. A headache. Follow these instructions at home: Insertion site care   Follow instructions from your doctor about how to take care of your insertion site. This is where an IV tube was put into your vein. Make sure you: Wash your hands with soap and water before and after you change your bandage (dressing). If you cannot use soap and water, use hand sanitizer. Change your bandage as told by your doctor. Check your insertion site every day for signs of infection. Check for: Redness, swelling, or pain. Bleeding from the site. Warmth. Pus or a bad smell. General instructions Take over-the-counter and prescription medicines only as told by your doctor. Rest as told by your doctor. Go back to your normal activities as told by your doctor. Keep all follow-up visits as told by your doctor. This is important. Contact a doctor if: You have itching or red, swollen areas of skin (hives). You feel worried or nervous (anxious). You feel weak after doing your normal activities. You have redness, swelling, warmth, or pain around the insertion site. You have blood coming from the insertion site, and the blood does not stop with pressure. You have pus or a bad smell coming from the insertion site. Get help right away if: You have signs of a serious reaction. This may be coming from an allergy or the body's defense system (immune system). Signs include: Trouble breathing or shortness of breath. Swelling of the face or feeling warm (flushed). Fever or chills. Head, chest, or back pain. Dark pee (urine) or blood in the pee. Widespread rash. Fast  heartbeat. Feeling dizzy or light-headed. You may receive your blood transfusion in an outpatient setting. If so, you will be told whom to contact to report any reactions. These symptoms may be an emergency. Do not wait to see if the symptoms will go away. Get medical help right away. Call your local emergency services (911 in the [place] Do not drive yourself to the hospital. Summary Bruising and soreness at the IV site are common. Check your insertion site every day for signs of infection. Rest as told by your doctor. Go back to your normal activities as told by your doctor. Get help right away if you have signs of a serious reaction. This information is not intended to replace advice given to you by your health care provider. Make sure you discuss any questions you have with your health care provider. Document Revised: 05/08/2020 Document Reviewed: 07/06/2018 Elsevier Patient Education  2022 Elsevier Inc.  

## 2020-11-27 LAB — BPAM RBC
Blood Product Expiration Date: 202211272359
Blood Product Expiration Date: 202211282359
ISSUE DATE / TIME: 202211011448
ISSUE DATE / TIME: 202211021051
Unit Type and Rh: 6200
Unit Type and Rh: 6200

## 2020-11-27 LAB — TYPE AND SCREEN
ABO/RH(D): A POS
Antibody Screen: NEGATIVE
Unit division: 0
Unit division: 0

## 2020-11-28 ENCOUNTER — Encounter: Payer: Self-pay | Admitting: Gastroenterology

## 2020-12-01 ENCOUNTER — Other Ambulatory Visit: Payer: Self-pay | Admitting: *Deleted

## 2020-12-01 ENCOUNTER — Inpatient Hospital Stay (HOSPITAL_BASED_OUTPATIENT_CLINIC_OR_DEPARTMENT_OTHER): Payer: Medicare Other | Admitting: Hematology and Oncology

## 2020-12-01 ENCOUNTER — Other Ambulatory Visit: Payer: Self-pay

## 2020-12-01 ENCOUNTER — Inpatient Hospital Stay: Payer: Medicare Other

## 2020-12-01 DIAGNOSIS — D649 Anemia, unspecified: Secondary | ICD-10-CM

## 2020-12-01 DIAGNOSIS — D46C Myelodysplastic syndrome with isolated del(5q) chromosomal abnormality: Secondary | ICD-10-CM | POA: Diagnosis not present

## 2020-12-01 DIAGNOSIS — R17 Unspecified jaundice: Secondary | ICD-10-CM

## 2020-12-01 LAB — SAMPLE TO BLOOD BANK

## 2020-12-01 LAB — CBC WITH DIFFERENTIAL (CANCER CENTER ONLY)
Abs Immature Granulocytes: 0.65 10*3/uL — ABNORMAL HIGH (ref 0.00–0.07)
Basophils Absolute: 0 10*3/uL (ref 0.0–0.1)
Basophils Relative: 1 %
Eosinophils Absolute: 0 10*3/uL (ref 0.0–0.5)
Eosinophils Relative: 1 %
HCT: 25.5 % — ABNORMAL LOW (ref 39.0–52.0)
Hemoglobin: 8.3 g/dL — ABNORMAL LOW (ref 13.0–17.0)
Immature Granulocytes: 15 %
Lymphocytes Relative: 20 %
Lymphs Abs: 0.9 10*3/uL (ref 0.7–4.0)
MCH: 31.4 pg (ref 26.0–34.0)
MCHC: 32.5 g/dL (ref 30.0–36.0)
MCV: 96.6 fL (ref 80.0–100.0)
Monocytes Absolute: 0.3 10*3/uL (ref 0.1–1.0)
Monocytes Relative: 6 %
Neutro Abs: 2.5 10*3/uL (ref 1.7–7.7)
Neutrophils Relative %: 57 %
Platelet Count: 61 10*3/uL — ABNORMAL LOW (ref 150–400)
RBC: 2.64 MIL/uL — ABNORMAL LOW (ref 4.22–5.81)
RDW: 26 % — ABNORMAL HIGH (ref 11.5–15.5)
Smear Review: DECREASED
WBC Count: 4.3 10*3/uL (ref 4.0–10.5)
nRBC: 0.7 % — ABNORMAL HIGH (ref 0.0–0.2)

## 2020-12-01 NOTE — Assessment & Plan Note (Addendum)
Severe normocytic anemia: 11/15/2019: Hemoglobin 11.7 10/16/2020: Hemoglobin 8.9 11/25/2020: Hemoglobin 6.8 (2 units of PRBC given) DAT: Negative, haptoglobin 219, folic acid 43, absolute reticulocyte count 66, immature reticulocyte fraction 22.2, reticulocyte percent: 3.1%, iron saturation 16%, ferritin 665, erythropoietin 249, creatinine 1.33, bilirubin 2.8, B12 618, platelets 71 12/01/2020: Hemoglobin 8.3, platelets 61 WBC 4.3, dysplastic neutrophils are present, Mehta and myelocytes noted on smear.  Plan: 1.  Bone marrow aspiration biopsy because he has worsening anemia and thrombocytopenia 2. supportive therapy with blood transfusions for hemoglobin less than 8.  Jaundice: Unclear etiology we will obtain an ultrasound of the liver.  He does not have hemolysis by the lab tests showing a normal haptoglobin.  His indirect bilirubin is increased.  Return to clinic 1 week after bone marrow biopsy to discuss results.   

## 2020-12-01 NOTE — Progress Notes (Signed)
Per MD request, RN placed order for US liver for further evaluation of Jaundice.

## 2020-12-01 NOTE — Progress Notes (Signed)
Patient Care Team: Leonard Downing, MD as PCP - General (Family Medicine)  DIAGNOSIS:  Encounter Diagnosis  Name Primary?   Normochromic normocytic anemia      CHIEF COMPLIANT: Follow-up to discuss results of labs done a week ago  INTERVAL HISTORY: Jordan Clayton is a 80 year old with above-mentioned history of severe anemia required blood transfusion last week who is here today to discuss the results of the blood test that we performed at that time.  He is accompanied by his wife.  She tells me that he felt markedly better after the blood transfusion but he continues to remain very fatigued and wheelchair-bound.  He does not have much energy to do any ADLs.   ALLERGIES:  is allergic to atorvastatin, oxycodone-acetaminophen, quetiapine, and rosuvastatin.  MEDICATIONS:  Current Outpatient Medications  Medication Sig Dispense Refill   amLODipine (NORVASC) 5 MG tablet Take 1 tablet (5 mg total) by mouth daily. 90 tablet 3   Coenzyme Q10 (COQ10 PO) Take 100 mg by mouth daily.     folic acid (FOLVITE) 161 MCG tablet Take 400 mcg by mouth daily. 1/2 tab daily     furosemide (LASIX) 40 MG tablet Take 1 tablet (40 mg total) by mouth daily. Daily as needed for swelling 90 tablet 0   HYDROcodone-acetaminophen (NORCO/VICODIN) 5-325 MG tablet Take 1 tablet by mouth as directed.     metFORMIN (GLUCOPHAGE) 1000 MG tablet Take 1,000 mg by mouth 2 (two) times daily.     metoprolol tartrate (LOPRESSOR) 25 MG tablet Take 1 tablet by mouth twice daily 180 tablet 2   Multiple Vitamin (MULTIVITAMIN) tablet Take 1 tablet by mouth daily.     Multiple Vitamins-Minerals (PRESERVISION AREDS 2 PO) Take by mouth daily.     nitroGLYCERIN (NITROSTAT) 0.4 MG SL tablet Place 1 tablet (0.4 mg total) under the tongue every 5 (five) minutes as needed (up to 3 tablets). 25 tablet 6   pravastatin (PRAVACHOL) 40 MG tablet Take 40 mg by mouth at bedtime.      quinapril (ACCUPRIL) 40 MG tablet Take 1 tablet by  mouth once daily with breakfast 90 tablet 3   Thiamine HCl (VITAMIN B-1) 100 MG tablet Take 100 mg by mouth daily.     vitamin B-12 (CYANOCOBALAMIN) 250 MCG tablet Take 250 mcg by mouth daily.     warfarin (COUMADIN) 5 MG tablet TAKE 1 TABLET BY MOUTH ONCE DAILY OR AS DIRECTED BY COUMADIN CLINIC 70 tablet 1   warfarin (COUMADIN) 7.5 MG tablet USE AS DIRECTED BY  COUMADIN  CLINIC 30 tablet 1   No current facility-administered medications for this visit.    PHYSICAL EXAMINATION: ECOG PERFORMANCE STATUS: 1 - Symptomatic but completely ambulatory  Vitals:   12/01/20 1135  BP: (!) 124/53  Pulse: 71  Resp: 18  Temp: (!) 97.3 F (36.3 C)  SpO2: 97%   Filed Weights   12/01/20 1135  Weight: 183 lb 1.6 oz (83.1 kg)      LABORATORY DATA:  I have reviewed the data as listed CMP Latest Ref Rng & Units 11/25/2020 11/25/2020 01/29/2020  Glucose 70 - 99 mg/dL - 123(H) 98  BUN 8 - 23 mg/dL - 32(H) 24  Creatinine 0.61 - 1.24 mg/dL - 1.33(H) 1.12  Sodium 135 - 145 mmol/L - 138 145(H)  Potassium 3.5 - 5.1 mmol/L - 4.1 4.5  Chloride 98 - 111 mmol/L - 105 107(H)  CO2 22 - 32 mmol/L - 24 25  Calcium 8.9 - 10.3 mg/dL -  9.0 9.6  Total Protein 6.5 - 8.1 g/dL - 6.9 6.4  Total Bilirubin 0.3 - 1.2 mg/dL 2.5(H) 2.8(H) 0.8  Alkaline Phos 38 - 126 U/L - 73 54  AST 15 - 41 U/L - 19 18  ALT 0 - 44 U/L - 14 13    Lab Results  Component Value Date   WBC 4.3 12/01/2020   HGB 8.3 (L) 12/01/2020   HCT 25.5 (L) 12/01/2020   MCV 96.6 12/01/2020   PLT 61 (L) 12/01/2020   NEUTROABS 2.5 12/01/2020    ASSESSMENT & PLAN:  Normochromic normocytic anemia Severe normocytic anemia: 11/15/2019: Hemoglobin 11.7 10/16/2020: Hemoglobin 8.9 11/25/2020: Hemoglobin 6.8 (2 units of PRBC given) DAT: Negative, haptoglobin 582, folic acid 43, absolute reticulocyte count 66, immature reticulocyte fraction 22.2, reticulocyte percent: 3.1%, iron saturation 16%, ferritin 665, erythropoietin 249, creatinine 1.33, bilirubin  2.8, B12 618, platelets 71 12/01/2020: Hemoglobin 8.3, platelets 61 WBC 4.3, dysplastic neutrophils are present, Mehta and myelocytes noted on smear.  Plan: 1.  Bone marrow aspiration biopsy because he has worsening anemia and thrombocytopenia 2. supportive therapy with blood transfusions for hemoglobin less than 8.  Jaundice: Unclear etiology we will obtain an ultrasound of the liver.  He does not have hemolysis by the lab tests showing a normal haptoglobin.  His indirect bilirubin is increased.  Return to clinic 1 week after bone marrow biopsy to discuss results.   No orders of the defined types were placed in this encounter.  The patient has a good understanding of the overall plan. he agrees with it. he will call with any problems that may develop before the next visit here. Total time spent: 30 mins including face to face time and time spent for planning, charting and co-ordination of care   Harriette Ohara, MD 12/01/20

## 2020-12-02 ENCOUNTER — Inpatient Hospital Stay (HOSPITAL_BASED_OUTPATIENT_CLINIC_OR_DEPARTMENT_OTHER): Payer: Medicare Other | Admitting: Hematology and Oncology

## 2020-12-02 ENCOUNTER — Other Ambulatory Visit: Payer: Self-pay | Admitting: *Deleted

## 2020-12-02 ENCOUNTER — Inpatient Hospital Stay: Payer: Medicare Other

## 2020-12-02 DIAGNOSIS — D649 Anemia, unspecified: Secondary | ICD-10-CM

## 2020-12-02 DIAGNOSIS — D46C Myelodysplastic syndrome with isolated del(5q) chromosomal abnormality: Secondary | ICD-10-CM | POA: Diagnosis not present

## 2020-12-02 LAB — CBC WITH DIFFERENTIAL (CANCER CENTER ONLY)
Abs Immature Granulocytes: 0.57 10*3/uL — ABNORMAL HIGH (ref 0.00–0.07)
Basophils Absolute: 0.1 10*3/uL (ref 0.0–0.1)
Basophils Relative: 1 %
Eosinophils Absolute: 0 10*3/uL (ref 0.0–0.5)
Eosinophils Relative: 1 %
HCT: 24.7 % — ABNORMAL LOW (ref 39.0–52.0)
Hemoglobin: 7.8 g/dL — ABNORMAL LOW (ref 13.0–17.0)
Immature Granulocytes: 14 %
Lymphocytes Relative: 21 %
Lymphs Abs: 0.8 10*3/uL (ref 0.7–4.0)
MCH: 30.8 pg (ref 26.0–34.0)
MCHC: 31.6 g/dL (ref 30.0–36.0)
MCV: 97.6 fL (ref 80.0–100.0)
Monocytes Absolute: 0.3 10*3/uL (ref 0.1–1.0)
Monocytes Relative: 7 %
Neutro Abs: 2.3 10*3/uL (ref 1.7–7.7)
Neutrophils Relative %: 56 %
Platelet Count: 58 10*3/uL — ABNORMAL LOW (ref 150–400)
RBC: 2.53 MIL/uL — ABNORMAL LOW (ref 4.22–5.81)
RDW: 25.5 % — ABNORMAL HIGH (ref 11.5–15.5)
WBC Count: 4.1 10*3/uL (ref 4.0–10.5)
nRBC: 1 % — ABNORMAL HIGH (ref 0.0–0.2)

## 2020-12-02 LAB — PREPARE RBC (CROSSMATCH)

## 2020-12-02 MED ORDER — HEPARIN SOD (PORK) LOCK FLUSH 100 UNIT/ML IV SOLN: 250.0000 [IU] | INTRAVENOUS | Status: DC | PRN

## 2020-12-02 MED ORDER — HEPARIN SOD (PORK) LOCK FLUSH 100 UNIT/ML IV SOLN: 500.0000 [IU] | Freq: Every day | INTRAVENOUS | Status: DC | PRN

## 2020-12-02 MED ORDER — LIDOCAINE HCL 2 % IJ SOLN: 20.0000 mL | Freq: Once | INTRAMUSCULAR | Status: DC

## 2020-12-02 MED ORDER — SODIUM CHLORIDE 0.9% FLUSH: 10.0000 mL | INTRAVENOUS | Status: DC | PRN

## 2020-12-02 MED ORDER — SODIUM CHLORIDE 0.9% FLUSH: 3.0000 mL | INTRAVENOUS | Status: DC | PRN

## 2020-12-02 NOTE — Progress Notes (Signed)
Bone Marrow Biopsy successfully preformed by Dr. Nicholas Lose.  Pt tolerated treatment well and observed 30 minutes post completion.  Discharge paperwork given to pt.

## 2020-12-02 NOTE — Progress Notes (Signed)
INDICATION: Severe anemia and thrombocytopenia  Brief examination was performed. Bone Marrow Biopsy and Aspiration Procedure Note   Informed consent was obtained and potential risks including bleeding, infection and pain were reviewed with the patient.  The patient's name, date of birth, identification, consent and allergies were verified prior to the start of procedure and time out was performed.  The right posterior iliac crest was chosen as the site of biopsy.  The skin was prepped with ChloraPrep.   8 cc of 1% lidocaine was used to provide local anaesthesia.   10 cc of bone marrow aspirate was obtained followed by 1cm biopsy.  Pressure was applied to the biopsy site and bandage was placed over the biopsy site. Patient was made to lie on the back for 15 mins prior to discharge.  The procedure was tolerated well. COMPLICATIONS: None BLOOD LOSS: none The patient was discharged home in stable condition with a 1 week follow up to review results.  Patient was provided with post bone marrow biopsy instructions and instructed to call if there was any bleeding or worsening pain.  Specimens sent for flow cytometry, cytogenetics and additional studies.  Signed Harriette Ohara, MD

## 2020-12-02 NOTE — Progress Notes (Signed)
Pt Hgb 7.8.  Per MD pt needing to receive 1 unit PRBC's.  Orders placed and appt scheduled for 12/03/20.  Pt currently scheduled for liver US 12/03/20 and verbal orders received from MD to run pt blood transfusion at 300 mL/Hr after the initial 15 minute time frame to ensure pt will be able to make it to [place] appt.

## 2020-12-03 ENCOUNTER — Ambulatory Visit (HOSPITAL_COMMUNITY)
Admission: RE | Admit: 2020-12-03 | Discharge: 2020-12-03 | Disposition: A | Discharge status: Home / Self Care | Payer: Medicare Other | Source: Ambulatory Visit | Attending: Hematology and Oncology | Admitting: Hematology and Oncology

## 2020-12-03 ENCOUNTER — Other Ambulatory Visit: Payer: Self-pay

## 2020-12-03 ENCOUNTER — Inpatient Hospital Stay: Payer: Medicare Other

## 2020-12-03 DIAGNOSIS — R17 Unspecified jaundice: Secondary | ICD-10-CM | POA: Insufficient documentation

## 2020-12-03 DIAGNOSIS — D649 Anemia, unspecified: Secondary | ICD-10-CM

## 2020-12-03 MED ORDER — ACETAMINOPHEN 325 MG PO TABS
650.0000 mg | ORAL_TABLET | Freq: Once | ORAL | Status: AC
  Administered 2020-12-03: 650 mg via ORAL
  Filled 2020-12-03: qty 2

## 2020-12-03 MED ORDER — SODIUM CHLORIDE 0.9% IV SOLUTION
250.0000 mL | Freq: Once | INTRAVENOUS | Status: AC
  Administered 2020-12-03: 250 mL via INTRAVENOUS

## 2020-12-03 MED ORDER — DIPHENHYDRAMINE HCL 25 MG PO CAPS
25.0000 mg | ORAL_CAPSULE | Freq: Once | ORAL | Status: AC
  Administered 2020-12-03: 25 mg via ORAL
  Filled 2020-12-03: qty 1

## 2020-12-03 NOTE — Patient Instructions (Signed)
Blood Transfusion, Adult, Care After This sheet gives you information about how to care for yourself after your procedure. Your doctor may also give you more specific instructions. If you have problems or questions, contact your doctor. What can I expect after the procedure? After the procedure, it is common to have: Bruising and soreness at the IV site. A headache. Follow these instructions at home: Insertion site care   Follow instructions from your doctor about how to take care of your insertion site. This is where an IV tube was put into your vein. Make sure you: Wash your hands with soap and water before and after you change your bandage (dressing). If you cannot use soap and water, use hand sanitizer. Change your bandage as told by your doctor. Check your insertion site every day for signs of infection. Check for: Redness, swelling, or pain. Bleeding from the site. Warmth. Pus or a bad smell. General instructions Take over-the-counter and prescription medicines only as told by your doctor. Rest as told by your doctor. Go back to your normal activities as told by your doctor. Keep all follow-up visits as told by your doctor. This is important. Contact a doctor if: You have itching or red, swollen areas of skin (hives). You feel worried or nervous (anxious). You feel weak after doing your normal activities. You have redness, swelling, warmth, or pain around the insertion site. You have blood coming from the insertion site, and the blood does not stop with pressure. You have pus or a bad smell coming from the insertion site. Get help right away if: You have signs of a serious reaction. This may be coming from an allergy or the body's defense system (immune system). Signs include: Trouble breathing or shortness of breath. Swelling of the face or feeling warm (flushed). Fever or chills. Head, chest, or back pain. Dark pee (urine) or blood in the pee. Widespread rash. Fast  heartbeat. Feeling dizzy or light-headed. You may receive your blood transfusion in an outpatient setting. If so, you will be told whom to contact to report any reactions. These symptoms may be an emergency. Do not wait to see if the symptoms will go away. Get medical help right away. Call your local emergency services (911 in the [place] Do not drive yourself to the hospital. Summary Bruising and soreness at the IV site are common. Check your insertion site every day for signs of infection. Rest as told by your doctor. Go back to your normal activities as told by your doctor. Get help right away if you have signs of a serious reaction. This information is not intended to replace advice given to you by your health care provider. Make sure you discuss any questions you have with your health care provider. Document Revised: 05/08/2020 Document Reviewed: 07/06/2018 Elsevier Patient Education  2022 Elsevier Inc.  

## 2020-12-04 ENCOUNTER — Telehealth: Payer: Self-pay | Admitting: Hematology and Oncology

## 2020-12-04 LAB — BPAM RBC
Blood Product Expiration Date: 202212022359
ISSUE DATE / TIME: 202211090816
Unit Type and Rh: 6200

## 2020-12-04 LAB — TYPE AND SCREEN
ABO/RH(D): A POS
Antibody Screen: NEGATIVE
Unit division: 0

## 2020-12-04 LAB — SURGICAL PATHOLOGY

## 2020-12-04 NOTE — Telephone Encounter (Signed)
Called patient regarding 11/16 appointment change, spoke with patient's spouse. Patient will be notified.

## 2020-12-08 NOTE — Progress Notes (Signed)
Patient Care Team: Leonard Downing, MD as PCP - General (Family Medicine)  DIAGNOSIS:    ICD-10-CM   1. Normochromic normocytic anemia  D64.9     2. MDS (myelodysplastic syndrome), high grade (HCC)  D46.Z CBC with Differential ([facility] Only)    Sample to Blood Bank      CHIEF COMPLIANT: Follow-up of anemia and to discuss her bone marrow biopsy results  INTERVAL HISTORY: Jordan Clayton is a 80 y.o. with above-mentioned history of severe anemia. He presents to the clinic today for follow-up.  Since he got blood transfusion he has been feeling significantly better.  Today his hemoglobin is up to 8.9.  He had a bone marrow biopsy last week and is here today to discuss results.  He has had no pain or discomfort after the bone marrow.  ALLERGIES:  is allergic to atorvastatin, oxycodone-acetaminophen, quetiapine, and rosuvastatin.  MEDICATIONS:  Current Outpatient Medications  Medication Sig Dispense Refill   amLODipine (NORVASC) 5 MG tablet Take 1 tablet (5 mg total) by mouth daily. 90 tablet 3   Coenzyme Q10 (COQ10 PO) Take 100 mg by mouth daily.     folic acid (FOLVITE) 379 MCG tablet Take 400 mcg by mouth daily. 1/2 tab daily     furosemide (LASIX) 40 MG tablet Take 1 tablet (40 mg total) by mouth daily. Daily as needed for swelling 90 tablet 0   HYDROcodone-acetaminophen (NORCO/VICODIN) 5-325 MG tablet Take 1 tablet by mouth as directed.     metFORMIN (GLUCOPHAGE) 1000 MG tablet Take 1,000 mg by mouth 2 (two) times daily.     metoprolol tartrate (LOPRESSOR) 25 MG tablet Take 1 tablet by mouth twice daily 180 tablet 2   Multiple Vitamin (MULTIVITAMIN) tablet Take 1 tablet by mouth daily.     Multiple Vitamins-Minerals (PRESERVISION AREDS 2 PO) Take by mouth daily.     nitroGLYCERIN (NITROSTAT) 0.4 MG SL tablet Place 1 tablet (0.4 mg total) under the tongue every 5 (five) minutes as needed (up to 3 tablets). 25 tablet 6   pravastatin (PRAVACHOL) 40 MG tablet Take 40 mg by  mouth at bedtime.      quinapril (ACCUPRIL) 40 MG tablet Take 1 tablet by mouth once daily with breakfast 90 tablet 3   Thiamine HCl (VITAMIN B-1) 100 MG tablet Take 100 mg by mouth daily.     vitamin B-12 (CYANOCOBALAMIN) 250 MCG tablet Take 250 mcg by mouth daily.     warfarin (COUMADIN) 5 MG tablet TAKE 1 TABLET BY MOUTH ONCE DAILY OR AS DIRECTED BY COUMADIN CLINIC 70 tablet 1   warfarin (COUMADIN) 7.5 MG tablet USE AS DIRECTED BY  COUMADIN  CLINIC 30 tablet 1   No current facility-administered medications for this visit.    PHYSICAL EXAMINATION: ECOG PERFORMANCE STATUS: 3 - Symptomatic, >50% confined to bed  Vitals:   12/09/20 1332  BP: 137/66  Pulse: 82  Resp: 18  Temp: (!) 97.5 F (36.4 C)  SpO2: 94%   Filed Weights   12/09/20 1332  Weight: 182 lb 12.8 oz (82.9 kg)    LABORATORY DATA:  I have reviewed the data as listed CMP Latest Ref Rng & Units 12/09/2020 11/25/2020 11/25/2020  Glucose 70 - 99 mg/dL 91 - 123(H)  BUN 8 - 23 mg/dL 25(H) - 32(H)  Creatinine 0.61 - 1.24 mg/dL 0.90 - 1.33(H)  Sodium 135 - 145 mmol/L 143 - 138  Potassium 3.5 - 5.1 mmol/L 4.1 - 4.1  Chloride 98 - 111  mmol/L 109 - 105  CO2 22 - 32 mmol/L 27 - 24  Calcium 8.9 - 10.3 mg/dL 9.2 - 9.0  Total Protein 6.5 - 8.1 g/dL 6.5 - 6.9  Total Bilirubin 0.3 - 1.2 mg/dL 1.3(H) 2.5(H) 2.8(H)  Alkaline Phos 38 - 126 U/L 67 - 73  AST 15 - 41 U/L 17 - 19  ALT 0 - 44 U/L 13 - 14    Lab Results  Component Value Date   WBC 4.1 12/09/2020   HGB 8.9 (L) 12/09/2020   HCT 28.1 (L) 12/09/2020   MCV 98.9 12/09/2020   PLT 70 (L) 12/09/2020   NEUTROABS PENDING 12/09/2020    ASSESSMENT & PLAN:  Normochromic normocytic anemia Severe normocytic anemia: 11/15/2019: Hemoglobin 11.7 10/16/2020: Hemoglobin 8.9 11/25/2020: Hemoglobin 6.8 (2 units of PRBC given) DAT: Negative, haptoglobin 295, folic acid 43, absolute reticulocyte count 66, immature reticulocyte fraction 22.2, reticulocyte percent: 3.1%, iron  saturation 16%, ferritin 665, erythropoietin 249, creatinine 1.33, bilirubin 2.8, B12 618, platelets 71 12/01/2020: Hemoglobin 8.3, platelets 61 WBC 4.3, dysplastic neutrophils are present, Meta and myelocytes noted on smear. 12/09/2020: Hemoglobin 8.9, platelets 70  Bone marrow biopsy 12/02/2020: Hypercellular bone marrow for age, 38 to 70% cellularity with dyspoietic changes associated with up to 5% myeloblasts favoring MDS with multilineage dysplasia (flow cytometry 4% myeloblasts expressing CD7, CD13, CD33, CD34, CD38, CD117 and HLA-DR) Awaiting cytogenetics to determine IPSS score  I believe that the patient has high risk MDS and requires disease modifying therapy but given his poor performance status, I am reluctant to start him on either Vidaza or Dacogen.  Instead we would like to start him on Aranesp every 4 weeks for 100 mcg.  Blood transfusions as needed. He will return every 2 weeks for labs.  He will receive Aranesp every 4 weeks.    Orders Placed This Encounter  Procedures   CBC with Differential ([facility] Only)    Standing Status:   Standing    Number of Occurrences:   100    Standing Expiration Date:   12/09/2021   Sample to Blood Bank    Standing Status:   Standing    Number of Occurrences:   100    Standing Expiration Date:   12/09/2021   The patient has a good understanding of the overall plan. he agrees with it. he will call with any problems that may develop before the next visit here.  Total time spent: 30 mins including face to face time and time spent for planning, charting and coordination of care  Rulon Eisenmenger, MD, MPH 12/09/2020  I, Thana Ates, am acting as scribe for Dr. Nicholas Lose.  I have reviewed the above documentation for accuracy and completeness, and I agree with the above.

## 2020-12-09 ENCOUNTER — Encounter: Payer: Self-pay | Admitting: Hematology and Oncology

## 2020-12-09 ENCOUNTER — Other Ambulatory Visit: Payer: Self-pay

## 2020-12-09 ENCOUNTER — Inpatient Hospital Stay: Payer: Medicare Other

## 2020-12-09 ENCOUNTER — Inpatient Hospital Stay: Payer: Medicare Other | Admitting: Hematology and Oncology

## 2020-12-09 DIAGNOSIS — D649 Anemia, unspecified: Secondary | ICD-10-CM

## 2020-12-09 DIAGNOSIS — D46Z Other myelodysplastic syndromes: Secondary | ICD-10-CM

## 2020-12-09 DIAGNOSIS — D46C Myelodysplastic syndrome with isolated del(5q) chromosomal abnormality: Secondary | ICD-10-CM | POA: Diagnosis not present

## 2020-12-09 HISTORY — DX: Other myelodysplastic syndromes: D46.Z

## 2020-12-09 LAB — CMP (CANCER CENTER ONLY)
ALT: 13 U/L (ref 0–44)
AST: 17 U/L (ref 15–41)
Albumin: 3.3 g/dL — ABNORMAL LOW (ref 3.5–5.0)
Alkaline Phosphatase: 67 U/L (ref 38–126)
Anion gap: 7 (ref 5–15)
BUN: 25 mg/dL — ABNORMAL HIGH (ref 8–23)
CO2: 27 mmol/L (ref 22–32)
Calcium: 9.2 mg/dL (ref 8.9–10.3)
Chloride: 109 mmol/L (ref 98–111)
Creatinine: 0.9 mg/dL (ref 0.61–1.24)
GFR, Estimated: >60 mL/min (ref >60)
Glucose, Bld: 91 mg/dL (ref 70–99)
Potassium: 4.1 mmol/L (ref 3.5–5.1)
Sodium: 143 mmol/L (ref 135–145)
Total Bilirubin: 1.3 mg/dL — ABNORMAL HIGH (ref 0.3–1.2)
Total Protein: 6.5 g/dL (ref 6.5–8.1)

## 2020-12-09 LAB — CBC WITH DIFFERENTIAL (CANCER CENTER ONLY)
Abs Immature Granulocytes: 0.37 10*3/uL — ABNORMAL HIGH (ref 0.00–0.07)
Basophils Absolute: 0 10*3/uL (ref 0.0–0.1)
Basophils Relative: 1 %
Eosinophils Absolute: 0 10*3/uL (ref 0.0–0.5)
Eosinophils Relative: 0 %
HCT: 28.1 % — ABNORMAL LOW (ref 39.0–52.0)
Hemoglobin: 8.9 g/dL — ABNORMAL LOW (ref 13.0–17.0)
Immature Granulocytes: 9 %
Lymphocytes Relative: 24 %
Lymphs Abs: 1 10*3/uL (ref 0.7–4.0)
MCH: 31.3 pg (ref 26.0–34.0)
MCHC: 31.7 g/dL (ref 30.0–36.0)
MCV: 98.9 fL (ref 80.0–100.0)
Monocytes Absolute: 0.3 10*3/uL (ref 0.1–1.0)
Monocytes Relative: 6 %
Neutro Abs: 2.5 10*3/uL (ref 1.7–7.7)
Neutrophils Relative %: 60 %
Platelet Count: 70 10*3/uL — ABNORMAL LOW (ref 150–400)
RBC: 2.84 MIL/uL — ABNORMAL LOW (ref 4.22–5.81)
RDW: 23.9 % — ABNORMAL HIGH (ref 11.5–15.5)
WBC Count: 4.1 10*3/uL (ref 4.0–10.5)
nRBC: 0.7 % — ABNORMAL HIGH (ref 0.0–0.2)

## 2020-12-09 LAB — SAMPLE TO BLOOD BANK

## 2020-12-09 NOTE — Assessment & Plan Note (Signed)
Severe normocytic anemia: 11/15/2019: Hemoglobin 11.7 10/16/2020: Hemoglobin 8.9 11/25/2020: Hemoglobin 6.8 (2 units of PRBC given) DAT: Negative, haptoglobin 384, folic acid 43, absolute reticulocyte count 66, immature reticulocyte fraction 22.2, reticulocyte percent: 3.1%, iron saturation 16%, ferritin 665, erythropoietin 249, creatinine 1.33, bilirubin 2.8, B12 618, platelets 71 12/01/2020: Hemoglobin 8.3, platelets 61 WBC 4.3, dysplastic neutrophils are present, Meta and myelocytes noted on smear.  Bone marrow biopsy 12/02/2020: Hypercellular bone marrow for age, 63 to 70% cellularity with dyspoietic changes associated with up to 5% myeloblasts favoring MDS with multilineage dysplasia (flow cytometry 4% myeloblasts expressing CD7, CD13, CD33, CD34, CD38, CD117 and HLA-DR) Awaiting cytogenetics to determine IPSS score  I believe that the patient has high risk MDS and requires disease modifying therapy

## 2020-12-10 ENCOUNTER — Encounter (HOSPITAL_COMMUNITY): Payer: Self-pay | Admitting: Hematology and Oncology

## 2020-12-10 ENCOUNTER — Ambulatory Visit (INDEPENDENT_AMBULATORY_CARE_PROVIDER_SITE_OTHER): Payer: Medicare Other | Admitting: Gastroenterology

## 2020-12-10 ENCOUNTER — Encounter: Payer: Self-pay | Admitting: Gastroenterology

## 2020-12-10 ENCOUNTER — Telehealth: Payer: Self-pay | Admitting: *Deleted

## 2020-12-10 VITALS — BP 100/60 | HR 53

## 2020-12-10 DIAGNOSIS — R197 Diarrhea, unspecified: Secondary | ICD-10-CM

## 2020-12-10 DIAGNOSIS — D46Z Other myelodysplastic syndromes: Secondary | ICD-10-CM | POA: Diagnosis not present

## 2020-12-10 DIAGNOSIS — D649 Anemia, unspecified: Secondary | ICD-10-CM

## 2020-12-10 NOTE — Telephone Encounter (Signed)
Spoke with pt wife, aware of dr Jacalyn Lefevre recommendations. She will restart the warfarin today at his previous dosage. Aware will have the pharm md give her a call to discuss dosage and follow up appointment. She also has several questions.

## 2020-12-10 NOTE — Progress Notes (Signed)
HPI : Jordan Clayton is a very pleasant 80 year old male with a history of coronary artery disease and atrial fibrillation who was referred to [place] by Dr. Rito Ehrlich for further evaluation of anemia.  The patient was referred to [place] 2 weeks ago when he presented to the hematology clinic with symptomatic anemia and a hemoglobin of 6.8.  Since that referral was placed however, the patient has since been diagnosed with myelodysplastic syndrome following the results of a bone marrow biopsy which the patient just found out about yesterday.  The patient denies any overt GI bleeding.  There had been some concern for dark stools at the time of his hematology visit, but the patient and the wife say that his stools are not black, they are just sometimes dark [place].  The patient denies any acute or concerning GI symptoms.  He does have a history of episodic loose stools with occasional incontinence.  This has been going on ever since his gallbladder was removed several years ago.  Most days, he does not have any problems with diarrhea or incontinence, but on average about once every week or 2 he will have the sudden urge to defecate and will not be able to get to the toilet in time.  Sometimes he also has stools which are hard and difficult to pass.  He denies any upper GI symptoms such as dysphagia, nausea/vomiting or dyspepsia.  His appetite is good.  His energy is much improved since he was transfused a unit of blood.  Patient states that his oncologist will continue to monitor his blood counts closely, and transfuse as needed.  He is going to begin treatment with Aranesp.   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   CAD (coronary artery disease)    Chronic atrial fibrillation (HCC)    Delirium    DM2 (diabetes mellitus, type 2) (HCC)    Drug therapy    coumadin   HTN (hypertension)    Hypernatremia    MDS (myelodysplastic syndrome), high grade ([place]) 12/09/2020   Mixed  hyperlipidemia    Septic shock(785.52)      Past Surgical History:  Procedure Laterality Date   CORONARY ARTERY BYPASS GRAFT  1994   LIMA to LAD per EBG   Family History  Problem Relation Age of Onset   Coronary artery disease Other        family hx   Hypertension Other        family hx   Social History   Tobacco Use   Smoking status: Never   Smokeless tobacco: Current    Types: Chew  Substance Use Topics   Alcohol use: No    Alcohol/week: 0.0 standard drinks   Drug use: No   Current Outpatient Medications  Medication Sig Dispense Refill   Coenzyme Q10 (COQ10 PO) Take 100 mg by mouth daily.     folic acid (FOLVITE) 482 MCG tablet Take 400 mcg by mouth daily. 1/2 tab daily     furosemide (LASIX) 40 MG tablet Take 1 tablet (40 mg total) by mouth daily. Daily as needed for swelling 90 tablet 0   HYDROcodone-acetaminophen (NORCO/VICODIN) 5-325 MG tablet Take 1 tablet by mouth as directed.     metFORMIN (GLUCOPHAGE) 1000 MG tablet Take 1,000 mg by mouth 2 (two) times daily.     metoprolol tartrate (LOPRESSOR) 25 MG tablet Take 1 tablet by mouth twice daily 180 tablet 2   Multiple Vitamin (MULTIVITAMIN)  tablet Take 1 tablet by mouth daily.     Multiple Vitamins-Minerals (PRESERVISION AREDS 2 PO) Take by mouth daily.     nitroGLYCERIN (NITROSTAT) 0.4 MG SL tablet Place 1 tablet (0.4 mg total) under the tongue every 5 (five) minutes as needed (up to 3 tablets). 25 tablet 6   pravastatin (PRAVACHOL) 40 MG tablet Take 40 mg by mouth at bedtime.      quinapril (ACCUPRIL) 40 MG tablet Take 1 tablet by mouth once daily with breakfast 90 tablet 3   Thiamine HCl (VITAMIN B-1) 100 MG tablet Take 100 mg by mouth daily.     vitamin B-12 (CYANOCOBALAMIN) 250 MCG tablet Take 250 mcg by mouth daily.     amLODipine (NORVASC) 5 MG tablet Take 1 tablet (5 mg total) by mouth daily. 90 tablet 3   warfarin (COUMADIN) 5 MG tablet TAKE 1 TABLET BY MOUTH ONCE DAILY OR AS DIRECTED BY COUMADIN CLINIC  (Patient not taking: Reported on 12/10/2020) 70 tablet 1   warfarin (COUMADIN) 7.5 MG tablet USE AS DIRECTED BY  COUMADIN  CLINIC (Patient not taking: Reported on 12/10/2020) 30 tablet 1   No current facility-administered medications for this visit.   Allergies  Allergen Reactions   Atorvastatin    Oxycodone-Acetaminophen     REACTION: Hallucinations   Quetiapine     REACTION: disorientated   Rosuvastatin     REACTION: Reaction not known     Review of Systems: All systems reviewed and negative except where noted in HPI.    US Abdomen Limited RUQ (LIVER/GB)  Result Date: 12/03/2020 CLINICAL DATA:  Jaundice, s/p cholecystectomy EXAM: ULTRASOUND ABDOMEN LIMITED RIGHT UPPER QUADRANT COMPARISON:  None. FINDINGS: Examination is very limited by overlying bowel gas and limited sonographic window. Gallbladder: Status post cholecystectomy. Common bile duct: Diameter: 8 mm Liver: No focal lesion identified. Within normal limits in parenchymal echogenicity. Portal vein is patent on color Doppler imaging with normal direction of blood flow towards the liver. Other: None. IMPRESSION: 1. Examination is very limited by overlying bowel gas and limited sonographic window. 2. Status post cholecystectomy. 3. Mildly prominent common bile duct measuring 8 mm. No obstructing calculus or other etiology identified. Consider CT or MRI to further evaluate otherwise unexplained jaundice. Electronically Signed   By: Delanna Ahmadi M.D.   On: 12/03/2020 11:01    Physical Exam: BP 100/60   Pulse (!) 53  Constitutional: Pleasant,well-developed, elderly Caucasian male in no acute distress, seated in wheelchair.  Accompanied by spouse HEENT: Normocephalic and atraumatic. Conjunctivae are normal. No scleral icterus. Cardiovascular: Normal rate, irregular rhythm.  Pulmonary/chest: Effort normal and breath sounds normal. No wheezing, rales or rhonchi. Abdominal: Soft, nondistended, nontender. Bowel sounds active  throughout. There are no masses palpable. No hepatomegaly. Extremities: no edema Neurological: Alert and oriented to person place and time. Skin: Skin is warm and dry. No rashes noted. Psychiatric: Normal mood and affect. Behavior is normal.  CBC    Component Value Date/Time   WBC 4.1 12/09/2020 1312   WBC 10.1 01/15/2018 0139   RBC 2.84 (L) 12/09/2020 1312   HGB 8.9 (L) 12/09/2020 1312   HGB 11.7 (L) 01/29/2020 1030   HCT 28.1 (L) 12/09/2020 1312   HCT 36.8 (L) 01/29/2020 1030   PLT 70 (L) 12/09/2020 1312   PLT 130 (L) 01/29/2020 1030   MCV 98.9 12/09/2020 1312   MCV 96 01/29/2020 1030   MCH 31.3 12/09/2020 1312   MCHC 31.7 12/09/2020 1312   RDW 23.9 (H) 12/09/2020  1312   RDW 18.6 (H) 01/29/2020 1030   LYMPHSABS 1.0 12/09/2020 1312   MONOABS 0.3 12/09/2020 1312   EOSABS 0.0 12/09/2020 1312   BASOSABS 0.0 12/09/2020 1312    CMP     Component Value Date/Time   NA 143 12/09/2020 1312   NA 145 (H) 01/29/2020 1030   K 4.1 12/09/2020 1312   CL 109 12/09/2020 1312   CO2 27 12/09/2020 1312   GLUCOSE 91 12/09/2020 1312   BUN 25 (H) 12/09/2020 1312   BUN 24 01/29/2020 1030   CREATININE 0.90 12/09/2020 1312   CREATININE 1.12 09/25/2015 1111   CALCIUM 9.2 12/09/2020 1312   PROT 6.5 12/09/2020 1312   PROT 6.4 01/29/2020 1030   ALBUMIN 3.3 (L) 12/09/2020 1312   ALBUMIN 4.2 01/29/2020 1030   AST 17 12/09/2020 1312   ALT 13 12/09/2020 1312   ALKPHOS 67 12/09/2020 1312   BILITOT 1.3 (H) 12/09/2020 1312   GFRNONAA >60 12/09/2020 1312   GFRAA 71 01/29/2020 1030   GFRAA >60 03/01/2018 1137     ASSESSMENT AND PLAN: 80 year old male with atrial fibrillation and coronary artery disease on Coumadin, referred to GI from hematology for further evaluation of severe normocytic anemia, however his bone marrow biopsies recently returned and he has been diagnosed with myelodysplastic syndrome, which explains his severe anemia.  He does not have any symptoms suggestive of GI bleeding.   There is no indication for an endoscopic evaluation at this time.  His only GI symptom is occasional episodic diarrhea with urgency and incontinence.  He states that he has been treated with bile acid binders in the past, without improvement.  He does take Metamucil on occasion, this seems to help.  He has not tried taking Metamucil on a daily basis.  I think daily Metamucil would work well to maintain a more regular bowel habit and consistency.  I advised him to avoid intake of fatty foods and artificial sweeteners, as this may predispose him to episodic diarrhea.  Anemia - Secondary to myelodysplastic syndrome - No symptoms of GI bleeding, no indication for endoscopic evaluation - CBC monitoring and transfusion per hematology  Diarrhea, episodic - Daily Metamucil - Avoid fatty foods, artificial sweeteners   Nicholas Lose, MD

## 2020-12-10 NOTE — Telephone Encounter (Signed)
Pt's wife, Vaughan Basta, called and asked if I could relay a message to Dr. Stanford Breed regarding the pt. She states the pt saw both the Oncologist and GI Physician's and the pt needs to know whether he needs to resume his Warfarin or not. She states the bleeding is not from his Warfarin according to both Physicians. Asked if the Physicians made a recommendation on restating Warfarin or not and she stated they did not and was told to follow up with Dr. Stanford Breed. She states the pt was diagnosed with MDS-Myelodysplastic Syndrome and that they pt will have the pt take injections/shots every month and have blood drawn frequently to monitor his hemoglobin and then be evaluated for blood transfusions if needed as well. Advised I would send this message to Dr. Stanford Breed and she would wait for a call back regarding this.

## 2020-12-10 NOTE — Patient Instructions (Signed)
If you are age 82 or older, your body mass index should be between 23-30. Your There is no height or weight on file to calculate BMI. If this is out of the aforementioned range listed, please consider follow up with your Primary Care Provider.  If you are age 72 or younger, your body mass index should be between 19-25. Your There is no height or weight on file to calculate BMI. If this is out of the aformentioned range listed, please consider follow up with your Primary Care Provider.   Please purchase Metamucil over the counter. Take as directed.  The [facility] providers would like to encourage you to use [facility] to communicate with providers for non-urgent requests or questions.  Due to long hold times on the telephone, sending your provider a message by [facility] may be a faster and more efficient way to get a response.  Please allow 48 business hours for a response.  Please remember that this is for non-urgent requests.   It was a pleasure to see you today!  Thank you for trusting me with your gastrointestinal care!    Scott E.Candis Schatz, MD

## 2020-12-11 NOTE — Telephone Encounter (Signed)
Returned call to pt's wife.  Pt resumed Warfarin yesterday 12/10/20 at previous dosage regimen 5mg  QD except 7.5mg  on Tues and Sat.  Per Dr Stanford Breed INR recheck in 4 days, that falls on the weekend, made appt to check INR on Monday 12/15/20 at 1:30pm.  Pt is receiving blood transfusions regularly at present, has appt on 11/22 with [facility] for labwork and possible transfusion.

## 2020-12-15 ENCOUNTER — Ambulatory Visit: Payer: Medicare Other

## 2020-12-15 ENCOUNTER — Other Ambulatory Visit: Payer: Self-pay

## 2020-12-15 DIAGNOSIS — Z5181 Encounter for therapeutic drug level monitoring: Secondary | ICD-10-CM

## 2020-12-15 DIAGNOSIS — Z7901 Long term (current) use of anticoagulants: Secondary | ICD-10-CM

## 2020-12-15 DIAGNOSIS — I4891 Unspecified atrial fibrillation: Secondary | ICD-10-CM | POA: Diagnosis not present

## 2020-12-15 LAB — POCT INR: INR: 1.5 — AB (ref 2.0–3.0)

## 2020-12-15 NOTE — Progress Notes (Signed)
Patient Care Team: Leonard Downing, MD as PCP - General (Family Medicine)  DIAGNOSIS:    ICD-10-CM   1. MDS (myelodysplastic syndrome), high grade (HCC)  D46.Z       CHIEF COMPLIANT: Follow-up of anemia   INTERVAL HISTORY: Jordan Clayton is a 80 y.o. with above-mentioned history of severe anemia. He presents to the clinic today for follow-up.  He reports decreased energy levels but overall not any significant difference from before.  He uses a wheelchair for ambulation.  His wife is his primary caregiver.  ALLERGIES:  is allergic to atorvastatin, oxycodone-acetaminophen, quetiapine, and rosuvastatin.  MEDICATIONS:  Current Outpatient Medications  Medication Sig Dispense Refill   amLODipine (NORVASC) 5 MG tablet Take 1 tablet (5 mg total) by mouth daily. 90 tablet 3   Coenzyme Q10 (COQ10 PO) Take 100 mg by mouth daily.     folic acid (FOLVITE) 263 MCG tablet Take 400 mcg by mouth daily. 1/2 tab daily     furosemide (LASIX) 40 MG tablet Take 1 tablet (40 mg total) by mouth daily. Daily as needed for swelling 90 tablet 0   HYDROcodone-acetaminophen (NORCO/VICODIN) 5-325 MG tablet Take 1 tablet by mouth as directed.     lenalidomide (REVLIMID) 2.5 MG capsule Take 1 capsule (2.5 mg total) by mouth daily. 1 capsule for 21 days on and 7 days off 21 capsule 0   metFORMIN (GLUCOPHAGE) 1000 MG tablet Take 1,000 mg by mouth 2 (two) times daily.     metoprolol tartrate (LOPRESSOR) 25 MG tablet Take 1 tablet by mouth twice daily 180 tablet 2   Multiple Vitamin (MULTIVITAMIN) tablet Take 1 tablet by mouth daily.     Multiple Vitamins-Minerals (PRESERVISION AREDS 2 PO) Take by mouth daily.     nitroGLYCERIN (NITROSTAT) 0.4 MG SL tablet Place 1 tablet (0.4 mg total) under the tongue every 5 (five) minutes as needed (up to 3 tablets). 25 tablet 6   pravastatin (PRAVACHOL) 40 MG tablet Take 40 mg by mouth at bedtime.      quinapril (ACCUPRIL) 40 MG tablet Take 1 tablet by mouth once daily with  breakfast 90 tablet 3   Thiamine HCl (VITAMIN B-1) 100 MG tablet Take 100 mg by mouth daily.     vitamin B-12 (CYANOCOBALAMIN) 250 MCG tablet Take 250 mcg by mouth daily.     warfarin (COUMADIN) 5 MG tablet TAKE 1 TABLET BY MOUTH ONCE DAILY OR AS DIRECTED BY COUMADIN CLINIC (Patient not taking: Reported on 12/10/2020) 70 tablet 1   warfarin (COUMADIN) 7.5 MG tablet USE AS DIRECTED BY  COUMADIN  CLINIC (Patient not taking: Reported on 12/10/2020) 30 tablet 1   No current facility-administered medications for this visit.   Facility-Administered Medications Ordered in Other Visits  Medication Dose Route Frequency Provider Last Rate Last Admin   Darbepoetin Alfa (ARANESP) injection 500 mcg  500 mcg Subcutaneous Once Nicholas Lose, MD        PHYSICAL EXAMINATION: ECOG PERFORMANCE STATUS: 2 - Symptomatic, <50% confined to bed  Vitals:   12/16/20 1318  BP: 134/76  Pulse: 85  Resp: 18  Temp: (!) 97.2 F (36.2 C)  SpO2: 98%   Filed Weights   12/16/20 1318  Weight: 183 lb 3.2 oz (83.1 kg)     LABORATORY DATA:  I have reviewed the data as listed CMP Latest Ref Rng & Units 12/09/2020 11/25/2020 11/25/2020  Glucose 70 - 99 mg/dL 91 - 123(H)  BUN 8 - 23 mg/dL 25(H) - 32(H)  Creatinine 0.61 - 1.24 mg/dL 0.90 - 1.33(H)  Sodium 135 - 145 mmol/L 143 - 138  Potassium 3.5 - 5.1 mmol/L 4.1 - 4.1  Chloride 98 - 111 mmol/L 109 - 105  CO2 22 - 32 mmol/L 27 - 24  Calcium 8.9 - 10.3 mg/dL 9.2 - 9.0  Total Protein 6.5 - 8.1 g/dL 6.5 - 6.9  Total Bilirubin 0.3 - 1.2 mg/dL 1.3(H) 2.5(H) 2.8(H)  Alkaline Phos 38 - 126 U/L 67 - 73  AST 15 - 41 U/L 17 - 19  ALT 0 - 44 U/L 13 - 14    Lab Results  Component Value Date   WBC 4.4 12/16/2020   HGB 8.5 (L) 12/16/2020   HCT 27.3 (L) 12/16/2020   MCV 97.8 12/16/2020   PLT 66 (L) 12/16/2020   NEUTROABS 3.1 12/16/2020    ASSESSMENT & PLAN:  MDS (myelodysplastic syndrome), 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 12/09/2020: Hemoglobin 8.9, platelets 70   Bone marrow biopsy 12/02/2020: Hypercellular bone marrow for age, 39 to 70% cellularity with dyspoietic changes associated with up to 5% myeloblasts favoring MDS with multilineage dysplasia (flow cytometry 4% myeloblasts expressing CD7, CD13, CD33, CD34, CD38, CD117 and HLA-DR) Cytogenetics and FISH: 5 q. Minus  Treatment plan: 1.  Aranesp every 4 weeks and blood transfusions as needed 2. Revlimid 2.5 mg 21 days on 7 days off to start as soon as the medicine becomes available.  Return to clinic in 2 weeks for labs    No orders of the defined types were placed in this encounter.  The patient has a good understanding of the overall plan. he agrees with it. he will call with any problems that may develop before the next visit here.  Total time spent: 30 mins including face to face time and time spent for planning, charting and coordination of care  Rulon Eisenmenger, MD, MPH 12/16/2020  I, Thana Ates, am acting as scribe for Dr. Nicholas Lose.  I have reviewed the above documentation for accuracy and completeness, and I agree with the above.

## 2020-12-15 NOTE — Patient Instructions (Signed)
-   take 2 tablets warfarin tonight, then - resume taking 5 mg daily except 7.5 mg on Tuesdays and Saturdays.  - Recheck in 2 weeks.  Coumadin Clinic [phone] Main [phone]

## 2020-12-16 ENCOUNTER — Encounter: Payer: Self-pay | Admitting: *Deleted

## 2020-12-16 ENCOUNTER — Telehealth: Payer: Self-pay

## 2020-12-16 ENCOUNTER — Inpatient Hospital Stay: Payer: Medicare Other | Admitting: Hematology and Oncology

## 2020-12-16 ENCOUNTER — Telehealth: Payer: Self-pay | Admitting: Pharmacy Technician

## 2020-12-16 ENCOUNTER — Other Ambulatory Visit: Payer: Self-pay | Admitting: *Deleted

## 2020-12-16 ENCOUNTER — Inpatient Hospital Stay: Payer: Medicare Other

## 2020-12-16 ENCOUNTER — Encounter: Payer: Self-pay | Admitting: Hematology and Oncology

## 2020-12-16 ENCOUNTER — Other Ambulatory Visit (HOSPITAL_COMMUNITY): Payer: Self-pay

## 2020-12-16 DIAGNOSIS — D46Z Other myelodysplastic syndromes: Secondary | ICD-10-CM

## 2020-12-16 DIAGNOSIS — D649 Anemia, unspecified: Secondary | ICD-10-CM

## 2020-12-16 DIAGNOSIS — D46C Myelodysplastic syndrome with isolated del(5q) chromosomal abnormality: Secondary | ICD-10-CM | POA: Diagnosis not present

## 2020-12-16 LAB — CBC WITH DIFFERENTIAL (CANCER CENTER ONLY)
Abs Immature Granulocytes: 0.08 10*3/uL — ABNORMAL HIGH (ref 0.00–0.07)
Basophils Absolute: 0 10*3/uL (ref 0.0–0.1)
Basophils Relative: 1 %
Eosinophils Absolute: 0 10*3/uL (ref 0.0–0.5)
Eosinophils Relative: 0 %
HCT: 27.3 % — ABNORMAL LOW (ref 39.0–52.0)
Hemoglobin: 8.5 g/dL — ABNORMAL LOW (ref 13.0–17.0)
Immature Granulocytes: 2 %
Lymphocytes Relative: 19 %
Lymphs Abs: 0.8 10*3/uL (ref 0.7–4.0)
MCH: 30.5 pg (ref 26.0–34.0)
MCHC: 31.1 g/dL (ref 30.0–36.0)
MCV: 97.8 fL (ref 80.0–100.0)
Monocytes Absolute: 0.4 10*3/uL (ref 0.1–1.0)
Monocytes Relative: 8 %
Neutro Abs: 3.1 10*3/uL (ref 1.7–7.7)
Neutrophils Relative %: 70 %
Platelet Count: 66 10*3/uL — ABNORMAL LOW (ref 150–400)
RBC: 2.79 MIL/uL — ABNORMAL LOW (ref 4.22–5.81)
RDW: 24.3 % — ABNORMAL HIGH (ref 11.5–15.5)
WBC Count: 4.4 10*3/uL (ref 4.0–10.5)
nRBC: 0.9 % — ABNORMAL HIGH (ref 0.0–0.2)

## 2020-12-16 LAB — SAMPLE TO BLOOD BANK

## 2020-12-16 MED ORDER — LENALIDOMIDE 2.5 MG PO CAPS
2.5000 mg | ORAL_CAPSULE | Freq: Every day | ORAL | 0 refills | Status: DC
  Filled 2020-12-16: qty 21, 21d supply, fill #0

## 2020-12-16 MED ORDER — LENALIDOMIDE 2.5 MG PO CAPS
2.5000 mg | ORAL_CAPSULE | Freq: Every day | ORAL | 5 refills | Status: DC
  Filled 2020-12-16: qty 21, 21d supply, fill #0

## 2020-12-16 MED ORDER — DARBEPOETIN ALFA 500 MCG/ML IJ SOSY
500.0000 ug | PREFILLED_SYRINGE | Freq: Once | INTRAMUSCULAR | Status: AC
  Administered 2020-12-16: 500 ug via SUBCUTANEOUS
  Filled 2020-12-16: qty 1

## 2020-12-16 MED ORDER — LENALIDOMIDE 2.5 MG PO CAPS
2.5000 mg | ORAL_CAPSULE | Freq: Every day | ORAL | 0 refills | Status: DC
Start: 2020-12-16 — End: 2021-01-16

## 2020-12-16 NOTE — Telephone Encounter (Signed)
Oral Oncology Patient Advocate Encounter  Prior Authorization for Revlimid has been approved.    PA# [id number] Effective dates: 12/16/20 through 01/24/22  Patients co-pay is $3828.51.  Oral Oncology Clinic will continue to follow.   [place] [facility] [place] Phone [phone] Fax [phone] 12/16/2020 2:54 PM

## 2020-12-16 NOTE — Assessment & Plan Note (Signed)
11/15/2019: Hemoglobin 11.7 10/16/2020: Hemoglobin 8.9 11/25/2020: Hemoglobin 6.8(2 units of PRBC given) DAT: Negative, haptoglobin 098, folic acid 43, absolute reticulocyte count 66, immature reticulocyte fraction 22.2, reticulocyte percent: 3.1%, iron saturation 16%, ferritin 665, erythropoietin 249, creatinine 1.33, bilirubin 2.8, B12 618, platelets 71 12/01/2020: Hemoglobin 8.3, platelets 61 WBC 4.3, dysplastic neutrophils are present, Meta and myelocytes noted on smear. 12/09/2020: Hemoglobin 8.9, platelets 70  Bone marrow biopsy 12/02/2020: Hypercellular bone marrow for age, 81 to 70% cellularity with dyspoietic changes associated with up to 5% myeloblasts favoring MDS with multilineage dysplasia (flow cytometry 4% myeloblasts expressing CD7, CD13, CD33, CD34, CD38, CD117 and HLA-DR) Cytogenetics and FISH: 5 q. Minus  Treatment plan: 1.  Aranesp every 4 weeks and blood transfusions as needed 2. Revlimid  Return to clinic in 2 weeks for labs

## 2020-12-16 NOTE — Patient Instructions (Signed)
Darbepoetin Alfa injection ?What is this medication? ?DARBEPOETIN ALFA (dar be POE e tin  AL fa) helps your body make more red blood cells. It is used to treat anemia caused by chronic kidney failure and chemotherapy. ?This medicine may be used for other purposes; ask your health care provider or pharmacist if you have questions. ?COMMON BRAND NAME(S): Aranesp ?What should I tell my care team before I take this medication? ?They need to know if you have any of these conditions: ?blood clotting disorders or history of blood clots ?cancer patient not on chemotherapy ?cystic fibrosis ?heart disease, such as angina, heart failure, or a history of a heart attack ?hemoglobin level of 12 g/dL or greater ?high blood pressure ?low levels of folate, iron, or vitamin B12 ?seizures ?an unusual or allergic reaction to darbepoetin, erythropoietin, albumin, hamster proteins, latex, other medicines, foods, dyes, or preservatives ?pregnant or trying to get pregnant ?breast-feeding ?How should I use this medication? ?This medicine is for injection into a vein or under the skin. It is usually given by a health care professional in a hospital or clinic setting. ?If you get this medicine at home, you will be taught how to prepare and give this medicine. Use exactly as directed. Take your medicine at regular intervals. Do not take your medicine more often than directed. ?It is important that you put your used needles and syringes in a special sharps container. Do not put them in a trash can. If you do not have a sharps container, call your pharmacist or healthcare provider to get one. ?A special MedGuide will be given to you by the pharmacist with each prescription and refill. Be sure to read this information carefully each time. ?Talk to your pediatrician regarding the use of this medicine in children. While this medicine may be used in children as young as 1 month of age for selected conditions, precautions do apply. ?Overdosage: If  you think you have taken too much of this medicine contact a poison control center or emergency room at once. ?NOTE: This medicine is only for you. Do not share this medicine with others. ?What if I miss a dose? ?If you miss a dose, take it as soon as you can. If it is almost time for your next dose, take only that dose. Do not take double or extra doses. ?What may interact with this medication? ?Do not take this medicine with any of the following medications: ?epoetin alfa ?This list may not describe all possible interactions. Give your health care provider a list of all the medicines, herbs, non-prescription drugs, or dietary supplements you use. Also tell them if you smoke, drink alcohol, or use illegal drugs. Some items may interact with your medicine. ?What should I watch for while using this medication? ?Your condition will be monitored carefully while you are receiving this medicine. ?You may need blood work done while you are taking this medicine. ?This medicine may cause a decrease in vitamin B6. You should make sure that you get enough vitamin B6 while you are taking this medicine. Discuss the foods you eat and the vitamins you take with your health care professional. ?What side effects may I notice from receiving this medication? ?Side effects that you should report to your doctor or health care professional as soon as possible: ?allergic reactions like skin rash, itching or hives, swelling of the face, lips, or tongue ?breathing problems ?changes in vision ?chest pain ?confusion, trouble speaking or understanding ?feeling faint or lightheaded, falls ?high blood   pressure ?muscle aches or pains ?pain, swelling, warmth in the leg ?rapid weight gain ?severe headaches ?sudden numbness or weakness of the face, arm or leg ?trouble walking, dizziness, loss of balance or coordination ?seizures (convulsions) ?swelling of the ankles, feet, hands ?unusually weak or tired ?Side effects that usually do not require  medical attention (report to your doctor or health care professional if they continue or are bothersome): ?diarrhea ?fever, chills (flu-like symptoms) ?headaches ?nausea, vomiting ?redness, stinging, or swelling at site where injected ?This list may not describe all possible side effects. Call your doctor for medical advice about side effects. You may report side effects to FDA at 1-800-FDA-1088. ?Where should I keep my medication? ?Keep out of the reach of children. ?Store in a refrigerator between 2 and 8 degrees C (36 and 46 degrees F). Do not freeze. Do not shake. Throw away any unused portion if using a single-dose vial. Throw away any unused medicine after the expiration date. ?NOTE: This sheet is a summary. It may not cover all possible information. If you have questions about this medicine, talk to your doctor, pharmacist, or health care provider. ?? 2022 Elsevier/Gold Standard (2017-01-31 00:00:00) ? ?

## 2020-12-16 NOTE — Telephone Encounter (Signed)
Oral Oncology Pharmacist Encounter  Received new prescription for lenalidomide (Revlimid) for the treatment of myelodysplastic syndrome in conjunction with Aranesp, planned duration until disease progression or unacceptable toxicity.  Labs from 12/09/20 (CMP) and 12/16/20 (CBC) assessed, no dose adjustments needed.  Current medication list in Epic reviewed, DDIs with Revlimid identified: none  Evaluated chart and no patient barriers to medication adherence noted.   Patient agreement for treatment documented in MD note on 12/16/2020.  Prescription has been e-scribed to the [facility] for benefits analysis and approval.  Oral Oncology Clinic will continue to follow for insurance authorization, copayment issues, initial counseling and start date.  Drema Halon, PharmD Hematology/Oncology Clinical Pharmacist [facility] [phone] 12/16/2020 2:21 PM

## 2020-12-16 NOTE — Progress Notes (Signed)
RN obtained [employer] Auth number [id number] valid for 30 days.

## 2020-12-16 NOTE — Telephone Encounter (Signed)
Oral Oncology Patient Advocate Encounter   Received notification from [employer] that prior authorization for Revlimid is required.   PA submitted on CoverMyMeds Key [id number] Status is pending   Oral Oncology Clinic will continue to follow.  [place] [facility] [place] Phone [phone] Fax [phone] 12/16/2020 2:48 PM

## 2020-12-19 ENCOUNTER — Other Ambulatory Visit: Payer: Self-pay | Admitting: Cardiology

## 2020-12-23 NOTE — Telephone Encounter (Signed)
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   Counseled patient on administration, dosing, side effects, monitoring, drug-food interactions, safe handling, storage, and disposal.  Patient will take Revlimid 2.5mg  capsules, 1 capsule by mouth once daily, without regard to food, with a full glass of water.  Revlimid will be given 21 days on, 7 days off, repeat every 28 days.  Revlimid start date: 12/26/2020  Adverse effects of Revlimid include but are not limited to: nausea, constipation, diarrhea, abdominal pain, rash, fatigue, drug fever, and decreased blood counts.    Reviewed with patient importance of keeping a medication schedule and plan for any missed doses. No barriers to medication adherence identified.  Medication reconciliation performed and medication/allergy list updated.  Patient is on warfarin which is directed by the coumadin clinic. Patient does not need additional VTE prophylaxis.   Insurance authorization for Revlimid has been obtained.  Revlimid prescription is being dispensed from [place] as it is a limited distribution medication. Medication shipment is set up and will be delivered to patients house on 12/25/2020.  All questions answered.  Mr. Hartis voiced understanding and appreciation.   Medication education handout placed in mail for patient. Patient knows to call the office with questions or concerns. Oral Chemotherapy Clinic phone number provided to patient.   Drema Halon, PharmD Hematology/Oncology Clinical Pharmacist [facility] [phone] 12/23/2020    10:36 AM

## 2020-12-29 ENCOUNTER — Other Ambulatory Visit: Payer: Self-pay

## 2020-12-29 ENCOUNTER — Ambulatory Visit: Payer: Medicare Other

## 2020-12-29 DIAGNOSIS — Z7901 Long term (current) use of anticoagulants: Secondary | ICD-10-CM

## 2020-12-29 DIAGNOSIS — I4891 Unspecified atrial fibrillation: Secondary | ICD-10-CM

## 2020-12-29 LAB — POCT INR: INR: 1.4 — AB (ref 2.0–3.0)

## 2020-12-29 NOTE — Patient Instructions (Signed)
Description   Take 7.5mg  today, then start taking 5 mg daily except 7.5 mg on Tuesdays, Thursdays and Saturdays.  Recheck in 2 weeks. Coumadin Clinic [phone] [address] [phone]

## 2020-12-30 ENCOUNTER — Other Ambulatory Visit: Payer: Self-pay | Admitting: *Deleted

## 2020-12-30 DIAGNOSIS — D649 Anemia, unspecified: Secondary | ICD-10-CM

## 2020-12-30 NOTE — Progress Notes (Signed)
Received call from pt spouse with pt complaint of weakness and fatigue x several days.  RN reviewed with [facility] and pt scheduled for f/u for further evaluation.  Pt educated on date and time of appt and verbalized understanding.

## 2020-12-31 ENCOUNTER — Other Ambulatory Visit: Payer: Self-pay

## 2020-12-31 ENCOUNTER — Inpatient Hospital Stay: Payer: Medicare Other

## 2020-12-31 ENCOUNTER — Encounter: Payer: Self-pay | Admitting: Hematology and Oncology

## 2020-12-31 ENCOUNTER — Inpatient Hospital Stay: Payer: Medicare Other | Attending: Hematology and Oncology

## 2020-12-31 ENCOUNTER — Inpatient Hospital Stay (HOSPITAL_BASED_OUTPATIENT_CLINIC_OR_DEPARTMENT_OTHER): Payer: Medicare Other | Admitting: Physician Assistant

## 2020-12-31 VITALS — BP 103/61 | HR 82 | Temp 98.0°F | Resp 18 | Wt 178.5 lb

## 2020-12-31 DIAGNOSIS — Z7961 Long term (current) use of immunomodulator: Secondary | ICD-10-CM | POA: Insufficient documentation

## 2020-12-31 DIAGNOSIS — Z79899 Other long term (current) drug therapy: Secondary | ICD-10-CM | POA: Insufficient documentation

## 2020-12-31 DIAGNOSIS — Z7901 Long term (current) use of anticoagulants: Secondary | ICD-10-CM | POA: Diagnosis not present

## 2020-12-31 DIAGNOSIS — D649 Anemia, unspecified: Secondary | ICD-10-CM

## 2020-12-31 DIAGNOSIS — D46C Myelodysplastic syndrome with isolated del(5q) chromosomal abnormality: Secondary | ICD-10-CM | POA: Diagnosis not present

## 2020-12-31 DIAGNOSIS — R627 Adult failure to thrive: Secondary | ICD-10-CM | POA: Diagnosis not present

## 2020-12-31 DIAGNOSIS — D46Z Other myelodysplastic syndromes: Secondary | ICD-10-CM | POA: Diagnosis not present

## 2020-12-31 DIAGNOSIS — Z79624 Long term (current) use of inhibitors of nucleotide synthesis: Secondary | ICD-10-CM | POA: Diagnosis not present

## 2020-12-31 DIAGNOSIS — I10 Essential (primary) hypertension: Secondary | ICD-10-CM | POA: Insufficient documentation

## 2020-12-31 LAB — CBC WITH DIFFERENTIAL (CANCER CENTER ONLY)
Abs Immature Granulocytes: 0.68 10*3/uL — ABNORMAL HIGH (ref 0.00–0.07)
Basophils Absolute: 0.1 10*3/uL (ref 0.0–0.1)
Basophils Relative: 1 %
Eosinophils Absolute: 0 10*3/uL (ref 0.0–0.5)
Eosinophils Relative: 0 %
HCT: 24.1 % — ABNORMAL LOW (ref 39.0–52.0)
Hemoglobin: 7.7 g/dL — ABNORMAL LOW (ref 13.0–17.0)
Immature Granulocytes: 12 %
Lymphocytes Relative: 14 %
Lymphs Abs: 0.8 10*3/uL (ref 0.7–4.0)
MCH: 31.6 pg (ref 26.0–34.0)
MCHC: 32 g/dL (ref 30.0–36.0)
MCV: 98.8 fL (ref 80.0–100.0)
Monocytes Absolute: 0.6 10*3/uL (ref 0.1–1.0)
Monocytes Relative: 10 %
Neutro Abs: 3.7 10*3/uL (ref 1.7–7.7)
Neutrophils Relative %: 63 %
Platelet Count: 52 10*3/uL — ABNORMAL LOW (ref 150–400)
RBC: 2.44 MIL/uL — ABNORMAL LOW (ref 4.22–5.81)
RDW: 26.5 % — ABNORMAL HIGH (ref 11.5–15.5)
WBC Count: 5.8 10*3/uL (ref 4.0–10.5)
nRBC: 0.9 % — ABNORMAL HIGH (ref 0.0–0.2)

## 2020-12-31 LAB — CMP (CANCER CENTER ONLY)
ALT: 14 U/L (ref 0–44)
AST: 17 U/L (ref 15–41)
Albumin: 3.1 g/dL — ABNORMAL LOW (ref 3.5–5.0)
Alkaline Phosphatase: 62 U/L (ref 38–126)
Anion gap: 12 (ref 5–15)
BUN: 32 mg/dL — ABNORMAL HIGH (ref 8–23)
CO2: 23 mmol/L (ref 22–32)
Calcium: 9 mg/dL (ref 8.9–10.3)
Chloride: 106 mmol/L (ref 98–111)
Creatinine: 1.12 mg/dL (ref 0.61–1.24)
GFR, Estimated: >60 mL/min (ref >60)
Glucose, Bld: 121 mg/dL — ABNORMAL HIGH (ref 70–99)
Potassium: 3.7 mmol/L (ref 3.5–5.1)
Sodium: 141 mmol/L (ref 135–145)
Total Bilirubin: 1.5 mg/dL — ABNORMAL HIGH (ref 0.3–1.2)
Total Protein: 6.7 g/dL (ref 6.5–8.1)

## 2020-12-31 LAB — SAMPLE TO BLOOD BANK

## 2020-12-31 LAB — PREPARE RBC (CROSSMATCH)

## 2020-12-31 MED ORDER — ACETAMINOPHEN 325 MG PO TABS
650.0000 mg | ORAL_TABLET | Freq: Once | ORAL | Status: AC
  Administered 2020-12-31: 650 mg via ORAL
  Filled 2020-12-31: qty 2

## 2020-12-31 MED ORDER — DIPHENHYDRAMINE HCL 25 MG PO CAPS
25.0000 mg | ORAL_CAPSULE | Freq: Once | ORAL | Status: AC
  Administered 2020-12-31: 25 mg via ORAL
  Filled 2020-12-31: qty 1

## 2020-12-31 MED ORDER — SODIUM CHLORIDE 0.9% IV SOLUTION
250.0000 mL | Freq: Once | INTRAVENOUS | Status: AC
  Administered 2020-12-31: 250 mL via INTRAVENOUS

## 2020-12-31 NOTE — Progress Notes (Signed)
Symptom Management Consult note [place]    Patient Care Team: Jordan Downing, MD as PCP - General (Family Medicine)    Name of the patient: Jordan Clayton  [id number]  10-23-1940   Date of visit: 12/31/2020    Chief complaint/ Reason for visit- fatigue, generalized weakness  Oncology History  MDS (myelodysplastic syndrome), high grade ([place])  12/09/2020 Initial Diagnosis   MDS (myelodysplastic syndrome), high grade with 5 q. minus   12/16/2020 Miscellaneous   Aranesp with Revlimid 2.5 mg daily 21 days on 7 days off     Current Therapy: Aranesp last received 12/16/20  Interval history- Julyan Clayton is an 80 yo male with history of severe anemia and myelodysplastic syndrome presenting to [facility] today accompanied by his spouse with chief complaint of fatigue and generalized weakness x 4 days. Symptoms are progressively worsening. He reports his last transfusion was approximately 1 month ago. Prior to that transfusion he was experiencing similar symptoms. He has not tried any OTC medications prior to arrival. He denies any bleeding or new wounds. Spouse reports his appetite is continuing to decrease, she does her best to encourage him to eat and drink during the day. He was able to eat oatmeal with a glass of water this AM. He denies fever, abnormal bleeding from gums, chest pain, shortness of breath, hemoptysis, hematochezia, blood in stool, black tarry stool. He admits to chronic swelling of RLE which he intermittently wears a compression sock and takes lasix for, no increase with RLE swelling and no swelling in LLE.    ROS  All other systems are reviewed and are negative for acute change except as noted in the HPI.    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   CAD (coronary artery disease)    Chronic atrial fibrillation (HCC)    Delirium    DM2 (diabetes mellitus, type 2) (HCC)    Drug therapy    coumadin   HTN (hypertension)    Hypernatremia    MDS (myelodysplastic syndrome), high grade ([place]) 12/09/2020   Mixed hyperlipidemia    Septic shock(785.52)      Past Surgical History:  Procedure Laterality Date   CORONARY ARTERY BYPASS GRAFT  1994   LIMA to LAD per [facility]    Social History   Socioeconomic History   Marital status: Married    Spouse name: Not on file   Number of children: Not on file   Years of education: Not on file   Highest education level: Not on file  Occupational History   Not on file  Tobacco Use   Smoking status: Never   Smokeless tobacco: Current    Types: Chew  Substance and Sexual Activity   Alcohol use: No    Alcohol/week: 0.0 standard drinks   Drug use: No   Sexual activity: Not Currently  Other Topics Concern   Not on file  Social History Narrative   Married; retired; disabled, does not get regular exercise.    Social Determinants of Health   Financial Resource Strain: Not on file  Food Insecurity: Not on file  Transportation Needs: Not on file  Physical Activity: Not on file  Stress:  Not on file  Social Connections: Not on file  Intimate Partner Violence: Not on file    Family History  Problem Relation Age of Onset   Coronary artery disease Other        family hx   Hypertension Other        family hx     Current Outpatient Medications:    amLODipine (NORVASC) 5 MG tablet, Take 1 tablet (5 mg total) by mouth daily., Disp: 90 tablet, Rfl: 3   Coenzyme Q10 (COQ10 PO), Take 100 mg by mouth daily., Disp: , Rfl:    folic acid (FOLVITE) 825 MCG tablet, Take 400 mcg by mouth daily. 1/2 tab daily, Disp: , Rfl:    furosemide (LASIX) 40 MG tablet, Take 1 tablet (40 mg total) by mouth daily. Daily as needed for swelling, Disp: 90 tablet, Rfl: 0    HYDROcodone-acetaminophen (NORCO/VICODIN) 5-325 MG tablet, Take 1 tablet by mouth as directed., Disp: , Rfl:    lenalidomide (REVLIMID) 2.5 MG capsule, Take 1 capsule (2.5 mg total) by mouth daily. 1 capsule for 21 days on and 7 days off. Repeat every 28 days., Disp: 21 capsule, Rfl: 0   metFORMIN (GLUCOPHAGE) 1000 MG tablet, Take 1,000 mg by mouth 2 (two) times daily., Disp: , Rfl:    metoprolol tartrate (LOPRESSOR) 25 MG tablet, Take 1 tablet by mouth twice daily, Disp: 180 tablet, Rfl: 2   Multiple Vitamin (MULTIVITAMIN) tablet, Take 1 tablet by mouth daily., Disp: , Rfl:    Multiple Vitamins-Minerals (PRESERVISION AREDS 2 PO), Take by mouth daily., Disp: , Rfl:    nitroGLYCERIN (NITROSTAT) 0.4 MG SL tablet, Place 1 tablet (0.4 mg total) under the tongue every 5 (five) minutes as needed (up to 3 tablets)., Disp: 25 tablet, Rfl: 6   pravastatin (PRAVACHOL) 40 MG tablet, Take 40 mg by mouth at bedtime. , Disp: , Rfl:    quinapril (ACCUPRIL) 40 MG tablet, Take 1 tablet by mouth once daily with breakfast, Disp: 90 tablet, Rfl: 3   Thiamine HCl (VITAMIN B-1) 100 MG tablet, Take 100 mg by mouth daily., Disp: , Rfl:    vitamin B-12 (CYANOCOBALAMIN) 250 MCG tablet, Take 250 mcg by mouth daily., Disp: , Rfl:    warfarin (COUMADIN) 5 MG tablet, TAKE 1 TABLET BY MOUTH ONCE DAILY OR AS DIRECTED BY COUMADIN CLINIC (Patient not taking: Reported on 12/10/2020), Disp: 70 tablet, Rfl: 1   warfarin (COUMADIN) 7.5 MG tablet, TAKE AS DIRECTED BY COUMADIN CLINIC, Disp: 30 tablet, Rfl: 0  PHYSICAL EXAM: ECOG FS:2 - Symptomatic, <50% confined to bed    Vitals:   12/31/20 1159  BP: 103/61  Pulse: 82  Resp: 18  Temp: 98 F (36.7 C)  TempSrc: Oral  SpO2: 92%  Weight: 178 lb 8 oz (81 kg)   Physical Exam Vitals and nursing note reviewed.  Constitutional:      Appearance: He is well-developed. He is not ill-appearing or toxic-appearing.  HENT:     Head: Normocephalic and atraumatic.     Nose: Nose normal.   Eyes:     General: No scleral icterus.       Right eye: No discharge.        Left eye: No discharge.     Conjunctiva/sclera: Conjunctivae normal.  Neck:     Vascular: No JVD.  Cardiovascular:     Rate and Rhythm: Normal rate. Rhythm irregular.     Pulses: Normal pulses.     Heart sounds: Normal heart sounds.  Pulmonary:     Effort: Pulmonary effort is normal.     Breath sounds: Normal breath sounds.  Abdominal:     General: There is no distension.  Musculoskeletal:        General: Normal range of motion.     Cervical back: Normal range of motion.     Left lower leg: No edema.     Comments: Swelling of RLE at baseline per patient and spouse  Skin:    General: Skin is warm and dry.     Capillary Refill: Capillary refill takes less than 2 seconds.     Coloration: Skin is jaundiced (baseline per spouse).     Findings: No bruising or rash.  Neurological:     Mental Status: He is oriented to person, place, and time.     GCS: GCS eye subscore is 4. GCS verbal subscore is 5. GCS motor subscore is 6.     Comments: Fluent speech, no facial droop.  Psychiatric:        Behavior: Behavior normal.       LABORATORY DATA: I have reviewed the data as listed CBC Latest Ref Rng & Units 12/31/2020 12/16/2020 12/09/2020  WBC 4.0 - 10.5 K/uL 5.8 4.4 4.1  Hemoglobin 13.0 - 17.0 g/dL 7.7(L) 8.5(L) 8.9(L)  Hematocrit 39.0 - 52.0 % 24.1(L) 27.3(L) 28.1(L)  Platelets 150 - 400 K/uL 52(L) 66(L) 70(L)     CMP Latest Ref Rng & Units 12/31/2020 12/09/2020 11/25/2020  Glucose 70 - 99 mg/dL 121(H) 91 -  BUN 8 - 23 mg/dL 32(H) 25(H) -  Creatinine 0.61 - 1.24 mg/dL 1.12 0.90 -  Sodium 135 - 145 mmol/L 141 143 -  Potassium 3.5 - 5.1 mmol/L 3.7 4.1 -  Chloride 98 - 111 mmol/L 106 109 -  CO2 22 - 32 mmol/L 23 27 -  Calcium 8.9 - 10.3 mg/dL 9.0 9.2 -  Total Protein 6.5 - 8.1 g/dL 6.7 6.5 -  Total Bilirubin 0.3 - 1.2 mg/dL 1.5(H) 1.3(H) 2.5(H)  Alkaline Phos 38 - 126 U/L 62 67 -  AST 15 - 41 U/L 17  17 -  ALT 0 - 44 U/L 14 13 -       RADIOGRAPHIC STUDIES: I have personally reviewed the radiological images as listed and agreed with the findings in the report. No images are attached to the encounter. US Abdomen Limited RUQ (LIVER/GB)  Result Date: 12/03/2020 CLINICAL DATA:  Jaundice, s/p cholecystectomy EXAM: ULTRASOUND ABDOMEN LIMITED RIGHT UPPER QUADRANT COMPARISON:  None. FINDINGS: Examination is very limited by overlying bowel gas and limited sonographic window. Gallbladder: Status post cholecystectomy. Common bile duct: Diameter: 8 mm Liver: No focal lesion identified. Within normal limits in parenchymal echogenicity. Portal vein is patent on color Doppler imaging with normal direction of blood flow towards the liver. Other: None. IMPRESSION: 1. Examination is very limited by overlying bowel gas and limited sonographic window. 2. Status post cholecystectomy. 3. Mildly prominent common bile duct measuring 8 mm. No obstructing calculus or other etiology identified. Consider CT or MRI to further evaluate otherwise unexplained jaundice. Electronically Signed   By: Delanna Ahmadi M.D.   On: 12/03/2020 11:01     ASSESSMENT & PLAN: Patient is a 80 y.o. male with significant history of MDS, severe anemia, afib on warfarin followed by oncologist Dr. Lindi Adie.  #) Weakness and fatigue- Patient non toxic appearing, HDS. Labs show hemoglobin 7.7, likely cause of symptoms. Also has thrombocytopenia with platelet count of 52. This is lower than previous  labs when it was 66 x 2 weeks ago, however patient has been in this range before. Patient has no signs of bleeding. Will transfuse patient 1 unit her in clinic. Discussed signs of bleeding to watch for after discharge. ED precautions discussed.  #) MDS- continue treatment per Dr. Lindi Adie. Patient already has follow up appointment scheduled for 12/20. Encouraged he keep that as scheduled. He'll have labs drawn at that appointment to trend hemoglobin and  platelets.   Visit Diagnosis: 1. Anemia, unspecified type   2. MDS (myelodysplastic syndrome), high grade (HCC)      No orders of the defined types were placed in this encounter.   All questions were answered. The patient knows to call the clinic with any problems, questions or concerns. No barriers to learning was detected.  I have spent a total of 30 minutes minutes of face-to-face and non-face-to-face time, preparing to see the patient, obtaining and/or reviewing separately obtained history, performing a medically appropriate examination, counseling and educating the patient, ordering tests,  documenting clinical information in the electronic health record, and care coordination.     Thank you for allowing me to participate in the care of this patient.    Barrie Folk, PA-C Department of Hematology/Oncology [facility] Phone: [phone]  [phone]    01/01/2021 10:09 AM

## 2020-12-31 NOTE — Patient Instructions (Signed)
Blood Transfusion, Adult, Care After This sheet gives you information about how to care for yourself after your procedure. Your doctor may also give you more specific instructions. If you have problems or questions, contact your doctor. What can I expect after the procedure? After the procedure, it is common to have: Bruising and soreness at the IV site. A headache. Follow these instructions at home: Insertion site care   Follow instructions from your doctor about how to take care of your insertion site. This is where an IV tube was put into your vein. Make sure you: Wash your hands with soap and water before and after you change your bandage (dressing). If you cannot use soap and water, use hand sanitizer. Change your bandage as told by your doctor. Check your insertion site every day for signs of infection. Check for: Redness, swelling, or pain. Bleeding from the site. Warmth. Pus or a bad smell. General instructions Take over-the-counter and prescription medicines only as told by your doctor. Rest as told by your doctor. Go back to your normal activities as told by your doctor. Keep all follow-up visits as told by your doctor. This is important. Contact a doctor if: You have itching or red, swollen areas of skin (hives). You feel worried or nervous (anxious). You feel weak after doing your normal activities. You have redness, swelling, warmth, or pain around the insertion site. You have blood coming from the insertion site, and the blood does not stop with pressure. You have pus or a bad smell coming from the insertion site. Get help right away if: You have signs of a serious reaction. This may be coming from an allergy or the body's defense system (immune system). Signs include: Trouble breathing or shortness of breath. Swelling of the face or feeling warm (flushed). Fever or chills. Head, chest, or back pain. Dark pee (urine) or blood in the pee. Widespread rash. Fast  heartbeat. Feeling dizzy or light-headed. You may receive your blood transfusion in an outpatient setting. If so, you will be told whom to contact to report any reactions. These symptoms may be an emergency. Do not wait to see if the symptoms will go away. Get medical help right away. Call your local emergency services (911 in the [place] Do not drive yourself to the hospital. Summary Bruising and soreness at the IV site are common. Check your insertion site every day for signs of infection. Rest as told by your doctor. Go back to your normal activities as told by your doctor. Get help right away if you have signs of a serious reaction. This information is not intended to replace advice given to you by your health care provider. Make sure you discuss any questions you have with your health care provider. Document Revised: 05/08/2020 Document Reviewed: 07/06/2018 Elsevier Patient Education  2022 Elsevier Inc.  

## 2021-01-01 ENCOUNTER — Other Ambulatory Visit: Payer: Self-pay | Admitting: Cardiology

## 2021-01-01 ENCOUNTER — Encounter: Payer: Self-pay | Admitting: Hematology and Oncology

## 2021-01-01 DIAGNOSIS — I10 Essential (primary) hypertension: Secondary | ICD-10-CM

## 2021-01-01 LAB — BPAM RBC
Blood Product Expiration Date: 202212262359
ISSUE DATE / TIME: 202212071326
Unit Type and Rh: 6200

## 2021-01-01 LAB — TYPE AND SCREEN
ABO/RH(D): A POS
Antibody Screen: NEGATIVE
Unit division: 0

## 2021-01-05 ENCOUNTER — Emergency Department (HOSPITAL_BASED_OUTPATIENT_CLINIC_OR_DEPARTMENT_OTHER): Payer: Medicare Other

## 2021-01-05 ENCOUNTER — Emergency Department (HOSPITAL_COMMUNITY): Payer: Medicare Other

## 2021-01-05 ENCOUNTER — Encounter (HOSPITAL_BASED_OUTPATIENT_CLINIC_OR_DEPARTMENT_OTHER): Payer: Self-pay

## 2021-01-05 ENCOUNTER — Other Ambulatory Visit (HOSPITAL_BASED_OUTPATIENT_CLINIC_OR_DEPARTMENT_OTHER): Payer: Self-pay

## 2021-01-05 ENCOUNTER — Emergency Department (HOSPITAL_BASED_OUTPATIENT_CLINIC_OR_DEPARTMENT_OTHER)
Admission: EM | Admit: 2021-01-05 | Discharge: 2021-01-05 | Disposition: A | Discharge status: Home / Self Care | Payer: Medicare Other | Attending: Emergency Medicine | Admitting: Emergency Medicine

## 2021-01-05 DIAGNOSIS — S22080A Wedge compression fracture of T11-T12 vertebra, initial encounter for closed fracture: Secondary | ICD-10-CM

## 2021-01-05 DIAGNOSIS — I4891 Unspecified atrial fibrillation: Secondary | ICD-10-CM | POA: Insufficient documentation

## 2021-01-05 DIAGNOSIS — I10 Essential (primary) hypertension: Secondary | ICD-10-CM | POA: Insufficient documentation

## 2021-01-05 DIAGNOSIS — Z79899 Other long term (current) drug therapy: Secondary | ICD-10-CM | POA: Diagnosis not present

## 2021-01-05 DIAGNOSIS — Z7984 Long term (current) use of oral hypoglycemic drugs: Secondary | ICD-10-CM | POA: Diagnosis not present

## 2021-01-05 DIAGNOSIS — I2510 Atherosclerotic heart disease of native coronary artery without angina pectoris: Secondary | ICD-10-CM | POA: Insufficient documentation

## 2021-01-05 DIAGNOSIS — Z7901 Long term (current) use of anticoagulants: Secondary | ICD-10-CM | POA: Diagnosis not present

## 2021-01-05 DIAGNOSIS — E119 Type 2 diabetes mellitus without complications: Secondary | ICD-10-CM | POA: Insufficient documentation

## 2021-01-05 DIAGNOSIS — W07XXXA Fall from chair, initial encounter: Secondary | ICD-10-CM | POA: Insufficient documentation

## 2021-01-05 DIAGNOSIS — W19XXXA Unspecified fall, initial encounter: Secondary | ICD-10-CM

## 2021-01-05 DIAGNOSIS — S3210XA Unspecified fracture of sacrum, initial encounter for closed fracture: Secondary | ICD-10-CM | POA: Insufficient documentation

## 2021-01-05 DIAGNOSIS — S299XXA Unspecified injury of thorax, initial encounter: Secondary | ICD-10-CM | POA: Diagnosis present

## 2021-01-05 MED ORDER — HYDROCODONE-ACETAMINOPHEN 5-325 MG PO TABS
1.0000 | ORAL_TABLET | Freq: Four times a day (QID) | ORAL | 0 refills | Status: DC | PRN
  Filled 2021-01-05: qty 14, 4d supply, fill #0

## 2021-01-05 NOTE — ED Triage Notes (Signed)
Pt reports he was sitting in a chair grilling a steak when he fell out of chair. Reports lower back pain.

## 2021-01-05 NOTE — ED Notes (Signed)
Patient transported to CT 

## 2021-01-05 NOTE — ED Provider Notes (Signed)
[place] EMERGENCY DEPARTMENT Provider Note   CSN: [id number] Arrival date & time: 01/05/21  4967     History Chief Complaint  Patient presents with   Back Pain   Fall    Jordan Clayton is a 80 y.o. male.  Patient slid out of a chair on Saturday now with complaint of low back pain.  Patient followed by hematology oncology for myelodysplastic syndrome.  Just had a blood transfusion done on on the ninth when he was seen by them.  Patient also has a history of coronary artery disease diabetes hypertension.  Also is on blood thinner secondary to chronic atrial fibrillation.  Patient's hemoglobin was 7.7 prior to that blood transfusion.  He received 1 unit of blood.  His complaint on Friday was sort of fatigue and weakness and they figured it was due to the hemoglobin being a little lower than his baseline.  Patient's hemoglobin is normally in the 8 or 9 range.      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   Normochromic normocytic anemia 11/25/2020   Sepsis secondary to UTI ([place]) 07/21/2016   Coronary artery disease 08/31/2011   Long term (current) use of anticoagulants 12/16/2010   EDEMA 02/09/2010   HYPERNATREMIA 09/12/2009   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   DELIRIUM 09/12/2009   SEPTIC SHOCK 09/12/2009   DM2 (diabetes mellitus, type 2) ([place]) 03/21/2009   HYPERLIPIDEMIA-MIXED 03/21/2009   Essential hypertension 03/21/2009   ATRIAL FIBRILLATION, CHRONIC 03/21/2009    Past Surgical History:  Procedure  Laterality Date   CORONARY ARTERY BYPASS GRAFT  1994   LIMA to LAD per [facility]       Family History  Problem Relation Age of Onset   Coronary artery disease Other        family hx   Hypertension Other        family hx    Social History   Tobacco Use   Smoking status: Never   Smokeless tobacco: Current    Types: Chew  Substance Use Topics   Alcohol use: No    Alcohol/week: 0.0 standard drinks   Drug use: No    Home Medications Prior to Admission medications   Medication Sig Start Date End Date Taking? Authorizing Provider  amLODipine (NORVASC) 5 MG tablet Take 1 tablet (5 mg total) by mouth daily. SCHEDULE OFFICE VISIT FOR FUTURE REFILL 01/01/21   Lelon Perla, MD  HYDROcodone-acetaminophen (NORCO/VICODIN) 5-325 MG tablet Take 1 tablet by mouth every 6 (six) hours as needed for moderate pain. 01/05/21  Yes Fredia Sorrow, MD  Coenzyme Q10 (COQ10 PO) Take 100 mg by mouth daily.    [provider]  folic acid (FOLVITE) 591 MCG tablet Take 400 mcg by mouth daily. 1/2 tab daily    [provider]  furosemide (LASIX) 40 MG tablet Take 1 tablet (40 mg total) by mouth daily. Daily as needed for swelling 07/23/16   Patrecia Pour, Christean Grief, MD  HYDROcodone-acetaminophen (NORCO/VICODIN) 5-325 MG tablet Take 1 tablet by mouth as directed. 01/01/20   [provider]  lenalidomide (REVLIMID) 2.5 MG capsule Take 1 capsule (2.5 mg total) by mouth daily. 1 capsule for 21 days on and 7 days off. Repeat every 28 days. 12/16/20   Nicholas Lose, MD  metFORMIN (GLUCOPHAGE) 1000 MG tablet Take 1,000 mg by mouth 2 (two) times daily.    [provider]  metoprolol tartrate (LOPRESSOR) 25 MG tablet Take 1 tablet by mouth twice daily 05/13/20   Lelon Perla, MD  Multiple Vitamin (MULTIVITAMIN) tablet Take 1 tablet by mouth daily.    [provider]  Multiple Vitamins-Minerals (PRESERVISION AREDS 2 PO) Take by mouth daily.    [provider]   nitroGLYCERIN (NITROSTAT) 0.4 MG SL tablet Place 1 tablet (0.4 mg total) under the tongue every 5 (five) minutes as needed (up to 3 tablets). 11/13/19   Lelon Perla, MD  pravastatin (PRAVACHOL) 40 MG tablet Take 40 mg by mouth at bedtime.     [provider]  quinapril (ACCUPRIL) 40 MG tablet Take 1 tablet by mouth once daily with breakfast 04/17/20   Lelon Perla, MD  Thiamine HCl (VITAMIN B-1) 100 MG tablet Take 100 mg by mouth daily.    [provider]  vitamin B-12 (CYANOCOBALAMIN) 250 MCG tablet Take 250 mcg by mouth daily.    [provider]  warfarin (COUMADIN) 5 MG tablet TAKE 1 TABLET BY MOUTH ONCE DAILY OR AS DIRECTED BY COUMADIN CLINIC Patient not taking: Reported on 12/10/2020 10/09/20   Lelon Perla, MD  warfarin (COUMADIN) 7.5 MG tablet TAKE AS DIRECTED BY COUMADIN CLINIC 12/22/20   Lelon Perla, MD    Allergies    Atorvastatin, Oxycodone-acetaminophen, Quetiapine, and Rosuvastatin  Review of Systems   Review of Systems  Constitutional:  Negative for chills and fever.  HENT:  Negative for ear pain and sore throat.   Eyes:  Negative for pain and visual disturbance.  Respiratory:  Negative for cough and shortness of breath.   Cardiovascular:  Negative for chest pain and palpitations.  Gastrointestinal:  Negative for abdominal pain and vomiting.  Genitourinary:  Negative for dysuria and hematuria.  Musculoskeletal:  Positive for back pain. Negative for arthralgias.  Skin:  Negative for color change and rash.  Neurological:  Negative for seizures and syncope.  All other systems reviewed and are negative.  Physical Exam Updated Vital Signs BP (!) 119/55   Pulse 70   Temp 98.3 F (36.8 C)   Resp (!) 22   Wt 80 kg   SpO2 99%   BMI 24.95 kg/m   Physical Exam Vitals and nursing note reviewed.  Constitutional:      General: He is not in acute distress.    Appearance: Normal appearance. He is well-developed.  HENT:      Head: Normocephalic and atraumatic.  Eyes:     Extraocular Movements: Extraocular movements intact.     Conjunctiva/sclera: Conjunctivae normal.     Pupils: Pupils are equal, round, and reactive to light.  Cardiovascular:     Rate and Rhythm: Normal rate and regular rhythm.     Heart sounds: No murmur heard. Pulmonary:     Effort: Pulmonary effort is normal. No respiratory distress.     Breath sounds: Normal breath sounds.  Abdominal:     Palpations: Abdomen is soft.     Tenderness: There is no abdominal tenderness.  Musculoskeletal:  General: No swelling.     Cervical back: Normal range of motion and neck supple.     Right lower leg: No edema.     Left lower leg: No edema.     Comments: Passive movement of legs without any pain in the hip or pelvic area.  However patient does have a lot of low back pain.  So could be a distraction.  Skin:    General: Skin is warm and dry.     Capillary Refill: Capillary refill takes less than 2 seconds.  Neurological:     General: No focal deficit present.     Mental Status: He is alert. Mental status is at baseline.  Psychiatric:        Mood and Affect: Mood normal.    ED Results / Procedures / Treatments   Labs (all labs ordered are listed, but only abnormal results are displayed) Labs Reviewed - No data to display  EKG None  Radiology DG Lumbar Spine Complete  Result Date: 01/05/2021 CLINICAL DATA:  Fall. EXAM: LUMBAR SPINE - COMPLETE 4+ VIEW COMPARISON:  chest x-ray 01/15/2018 FINDINGS: Bones are diffusely demineralized. Age indeterminate mild compression deformity noted at T12, new since 01/15/2018. Mild loss of intervertebral disc height noted at L2-3. SI joints unremarkable. Atherosclerotic calcification noted abdominal aorta. IMPRESSION: Age indeterminate mild compression deformity at T12 new since 2019. CT or MRI could be used to further evaluate as clinically warranted. Aortic Atherosclerois (ICD10-170.0) Electronically  Signed   By: Misty Stanley M.D.   On: 01/05/2021 08:36   CT THORACIC SPINE WO CONTRAST  Result Date: 01/05/2021 CLINICAL DATA:  80 year old male with fall and back pain. Age indeterminate T12 compression fracture on radiographs. EXAM: CT THORACIC SPINE WITHOUT CONTRAST TECHNIQUE: Multidetector CT images of the thoracic were obtained using the standard protocol without intravenous contrast. COMPARISON:  Lumbar radiographs today. FINDINGS: Limited cervical spine imaging: Cervicothoracic junction alignment is within normal limits. Lower cervical disc, endplate and facet degeneration. Thoracic spine segmentation:  Appears to be normal. Alignment: Mildly exaggerated thoracic kyphosis. No spondylolisthesis. Vertebrae: Osteopenia. Thoracic vertebrae T1 through T9 appear intact. Subtle T10 inferior endplate irregularity is probably degenerative and chronic. Similar appearance at T11. T12 vertebral body loss of height up to 25% with un healed inferior endplate fracture (sagittal image 25 and coronal image 41). Difficult to exclude recent superior endplate compression also. But no retropulsion. The T12 pedicles and posterior elements appear intact. Visible posterior ribs are intact. Paraspinal and other soft tissues: Major airways are patent. Respiratory motion and dependent pulmonary atelectasis with small or trace layering simple fluid density left pleural effusion. Calcified aortic atherosclerosis. Calcified coronary artery atherosclerosis. Small calcified bilateral hilar lymph nodes, postinflammatory. No evidence of pericardial effusion. Negative visible noncontrast upper abdominal viscera. Thoracic paraspinal soft tissues remain normal. Disc levels: Mild for age thoracic spine degeneration. No CT evidence of thoracic spinal stenosis. IMPRESSION: 1. Osteopenia. Un-healed T12 compression fracture with loss of height up to 25%. No retropulsion or complicating features. If specific therapy is desired, Thoracic MRI  without contrast or Nuclear Medicine Whole-body Bone Scan would best confirm candidacy for vertebroplasty. 2. No other acute osseous abnormality in the thoracic spine. Mild for age thoracic spine degeneration. 3. Trace left pleural effusion. Calcified coronary artery and Aortic Atherosclerosis (ICD10-I70.0). Electronically Signed   By: Genevie Ann M.D.   On: 01/05/2021 10:31   CT Lumbar Spine Wo Contrast  Result Date: 01/05/2021 CLINICAL DATA:  80 year old male with fall and back pain. Age  indeterminate T12 compression fracture on radiographs. EXAM: CT LUMBAR SPINE WITHOUT CONTRAST TECHNIQUE: Multidetector CT imaging of the lumbar spine was performed without intravenous contrast administration. Multiplanar CT image reconstructions were also generated. COMPARISON:  Lumbar radiographs and thoracic spine CT today reported separately. FINDINGS: Segmentation: Normal, concordant with the thoracic spine numbering today. Alignment: Mild straightening of lumbar lordosis. No spondylolisthesis. Vertebrae: T12 is reported separately. Osteopenia. Unfused L1 transverse process ossification centers (normal variant). Intact lumbar vertebrae. Subtle ventral cortical defect in the lower sacrum at the S4 segment. See sagittal image 35. Elsewhere, the visible sacrum and SI joints appear intact. Paraspinal and other soft tissues: Aortoiliac calcified atherosclerosis. Normal caliber abdominal aorta. There is trace free fluid in the pelvis on series 11, image 140. Simple fluid density. Otherwise negative visible noncontrast abdominal and pelvic viscera. Lumbar paraspinal soft tissues are within normal limits. 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 Disc degeneration maximal at L2-L3. Facet degeneration maximal at L4-L5. No CT evidence of lumbar spinal stenosis. IMPRESSION: 1. Osteopenia. Subtle fracture of the ventral S4 sacral body. This is age indeterminate by CT but suspicious for acute fracture (see #3). Sacral  ala and SI joints appear intact. 2. No acute osseous abnormality in the lumbar spine. 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 3. Trace free fluid in the pelvis, nonspecific but perhaps secondary to #1. 4. Aortic Atherosclerosis (ICD10-I70.0). Electronically Signed   By: Genevie Ann M.D.   On: 01/05/2021 10:36   DG Hips Bilat W or Wo Pelvis 3-4 Views  Result Date: 01/05/2021 CLINICAL DATA:  Sat on the arm of a chair and fell on Saturday, low back and RIGHT hip pain EXAM: DG HIP (WITH OR WITHOUT PELVIS) 3-4V BILAT COMPARISON:  None FINDINGS: Osseous demineralization. SI joint spaces preserved. Mild narrowing of the hip joints bilaterally. No acute fracture, dislocation, or bone destruction. Scattered atherosclerotic calcifications. IMPRESSION: No acute osseous abnormalities. 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 Electronically Signed   By: Lavonia Dana M.D.   On: 01/05/2021 08:35    Procedures Procedures   Medications Ordered in ED Medications - No data to display  ED Course  I have reviewed the triage vital signs and the nursing notes.  Pertinent labs & imaging results that were available during my care of the patient were reviewed by me and considered in my medical decision making (see chart for details).    MDM Rules/Calculators/A&P                           Patient followed by hematology oncology for MDS.  Patient just had blood transfusion on Friday.  Main concern here today is late out of chair on Saturday.  Complaining of low back pain.  Will get x-rays of the lumbar spine.  We will also get pelvis and hips.  Since he is got a lot of pain down low.  Although clinically it appears that there is no obvious injury to the hips.  But difficult to tell since there is a lot of low back pain.  Plain x-rays found T12 compression fracture age undetermined.  Based on this patient CT scans of lumbar and thoracic spine.  It appears that probably the T12 fracture little unhealed is probably  old.  There is evidence of a probable new acute sacral fracture.  That will heal on its own.  Patient stable for discharge home.  Patient does well with hydrocodone's had that in the past.  We will provide a prescription for them to pick up at the pharmacy here.   Final Clinical Impression(s) / ED Diagnoses Final diagnoses:  Fall, initial encounter  Compression fracture of T12 vertebra, initial encounter ([place])  Closed fracture of sacrum, unspecified fracture morphology, initial encounter [facility])    Rx / DC Orders ED Discharge Orders          Ordered    HYDROcodone-acetaminophen (NORCO/VICODIN) 5-325 MG tablet  Every 6 hours PRN        01/05/21 1215             Fredia Sorrow, MD 01/05/21 1215

## 2021-01-05 NOTE — Discharge Instructions (Addendum)
The thoracic vertebrae compression fracture appears to be old.  But not completely healed.  New evidence of a nondisplaced sacral fracture.  This may explain your pain based on the location.  It heal on its own.  Follow-up with your primary care doctor.

## 2021-01-12 NOTE — Progress Notes (Signed)
Patient Care Team: Leonard Downing, MD as PCP - General (Family Medicine)  DIAGNOSIS:    ICD-10-CM   1. MDS (myelodysplastic syndrome), high grade (HCC)  D46.Z       SUMMARY OF ONCOLOGIC HISTORY: Oncology History  MDS (myelodysplastic syndrome), high grade ([place])  12/09/2020 Initial Diagnosis   MDS (myelodysplastic syndrome), high grade with 5 q. minus   12/16/2020 Miscellaneous   Aranesp with Revlimid 2.5 mg daily 21 days on 7 days off     CHIEF COMPLIANT: Follow-up of anemia   INTERVAL HISTORY: Jordan Clayton is a 80 y.o. with above-mentioned history of severe anemia. He presents to the clinic today for follow-up.  Since the last visit he has fallen and injured his sacrum and lower back area.  He had CT scans of his thoracic and lumbar spine which seem to show sacral fracture as well as a T12 compression fracture.  He is in a lot of pain especially in the sacrum.  He was given pain medications and it appears to be only moderately effective.  He still has multiple episodes of intense pain.  Since he started Revlimid 3 weeks ago he tells me that his appetite has been somewhat diminished.  He has not been eating as much food and that is what his family is very worried about.  He does not have much of energy and he sleeps most of the day.  ALLERGIES:  is allergic to atorvastatin, oxycodone-acetaminophen, quetiapine, and rosuvastatin.  MEDICATIONS:  Current Outpatient Medications  Medication Sig Dispense Refill   amLODipine (NORVASC) 5 MG tablet Take 1 tablet (5 mg total) by mouth daily. SCHEDULE OFFICE VISIT FOR FUTURE REFILL 90 tablet 0   Coenzyme Q10 (COQ10 PO) Take 100 mg by mouth daily.     folic acid (FOLVITE) 183 MCG tablet Take 400 mcg by mouth daily. 1/2 tab daily     furosemide (LASIX) 40 MG tablet Take 1 tablet (40 mg total) by mouth daily. Daily as needed for swelling 90 tablet 0   HYDROcodone-acetaminophen (NORCO/VICODIN) 5-325 MG tablet Take 1 tablet by mouth as  directed.     HYDROcodone-acetaminophen (NORCO/VICODIN) 5-325 MG tablet Take 1 tablet by mouth every 6 (six) hours as needed for moderate pain. 14 tablet 0   lenalidomide (REVLIMID) 2.5 MG capsule Take 1 capsule (2.5 mg total) by mouth daily. 1 capsule for 21 days on and 7 days off. Repeat every 28 days. 21 capsule 0   metFORMIN (GLUCOPHAGE) 1000 MG tablet Take 1,000 mg by mouth 2 (two) times daily.     metoprolol tartrate (LOPRESSOR) 25 MG tablet Take 1 tablet by mouth twice daily 180 tablet 2   Multiple Vitamin (MULTIVITAMIN) tablet Take 1 tablet by mouth daily.     Multiple Vitamins-Minerals (PRESERVISION AREDS 2 PO) Take by mouth daily.     nitroGLYCERIN (NITROSTAT) 0.4 MG SL tablet Place 1 tablet (0.4 mg total) under the tongue every 5 (five) minutes as needed (up to 3 tablets). 25 tablet 6   pravastatin (PRAVACHOL) 40 MG tablet Take 40 mg by mouth at bedtime.      quinapril (ACCUPRIL) 40 MG tablet Take 1 tablet by mouth once daily with breakfast 90 tablet 3   Thiamine HCl (VITAMIN B-1) 100 MG tablet Take 100 mg by mouth daily.     vitamin B-12 (CYANOCOBALAMIN) 250 MCG tablet Take 250 mcg by mouth daily.     warfarin (COUMADIN) 5 MG tablet TAKE 1 TABLET BY MOUTH ONCE DAILY OR  AS DIRECTED BY COUMADIN CLINIC (Patient not taking: Reported on 12/10/2020) 70 tablet 1   warfarin (COUMADIN) 7.5 MG tablet TAKE AS DIRECTED BY COUMADIN CLINIC 30 tablet 0   No current facility-administered medications for this visit.    PHYSICAL EXAMINATION: ECOG PERFORMANCE STATUS: 1 - Symptomatic but completely ambulatory  Vitals:   01/13/21 1056  BP: (!) 118/48  Pulse: 91  Resp: 18  Temp: (!) 97.5 F (36.4 C)  SpO2: 94%   Filed Weights   01/13/21 1056  Weight: 170 lb 6 oz (77.3 kg)    LABORATORY DATA:  I have reviewed the data as listed CMP Latest Ref Rng & Units 12/31/2020 12/09/2020 11/25/2020  Glucose 70 - 99 mg/dL 121(H) 91 -  BUN 8 - 23 mg/dL 32(H) 25(H) -  Creatinine 0.61 - 1.24 mg/dL 1.12  0.90 -  Sodium 135 - 145 mmol/L 141 143 -  Potassium 3.5 - 5.1 mmol/L 3.7 4.1 -  Chloride 98 - 111 mmol/L 106 109 -  CO2 22 - 32 mmol/L 23 27 -  Calcium 8.9 - 10.3 mg/dL 9.0 9.2 -  Total Protein 6.5 - 8.1 g/dL 6.7 6.5 -  Total Bilirubin 0.3 - 1.2 mg/dL 1.5(H) 1.3(H) 2.5(H)  Alkaline Phos 38 - 126 U/L 62 67 -  AST 15 - 41 U/L 17 17 -  ALT 0 - 44 U/L 14 13 -    Lab Results  Component Value Date   WBC 12.8 (H) 01/13/2021   HGB 7.7 (L) 01/13/2021   HCT 24.2 (L) 01/13/2021   MCV 97.2 01/13/2021   PLT 77 (L) 01/13/2021   NEUTROABS 8.7 (H) 01/13/2021    ASSESSMENT & PLAN:  MDS (myelodysplastic syndrome), high grade (HCC) 11/15/2019: Hemoglobin 11.7 10/16/2020: Hemoglobin 8.9 11/25/2020: Hemoglobin 6.8 (2 units of PRBC given) DAT: Negative, haptoglobin 220, folic acid 43, absolute reticulocyte count 66, immature reticulocyte fraction 22.2, reticulocyte percent: 3.1%, iron saturation 16%, ferritin 665, erythropoietin 249, creatinine 1.33, bilirubin 2.8, B12 618, platelets 71 12/01/2020: Hemoglobin 8.3, platelets 61 WBC 4.3, dysplastic neutrophils are present, Meta and myelocytes noted on smear. 12/09/2020: Hemoglobin 8.9, platelets 70 01/13/2021: Hemoglobin 7.7, platelets 77, WBC 12.8, ANC 8.7   Bone marrow biopsy 12/02/2020: Hypercellular bone marrow for age, 33 to 70% cellularity with dyspoietic changes associated with up to 5% myeloblasts favoring MDS with multilineage dysplasia (flow cytometry 4% myeloblasts expressing CD7, CD13, CD33, CD34, CD38, CD117 and HLA-DR) Cytogenetics and FISH: 5 q. Minus   Treatment plan: 1.  Aranesp every 4 weeks and blood transfusions as needed 2. Revlimid 2.5 mg 21 days on 7 days off started 12/25/2020   Compression fractures and fracture of the sacrum: Significant pain.  He is being managed by his primary care physician.  He tells me that his pain is not under good control.  I encouraged him to apply topical lidocaine cream to alleviate some of the local  discomfort.  Failure to thrive: I am extremely worried about his generalized health.  We discussed the pros and cons of giving blood transfusion and we decided to hold off on it.  He will get Aranesp injection today.  Return to clinic in 4 weeks for labs and injection/blood transfusion appointment    No orders of the defined types were placed in this encounter.  The patient has a good understanding of the overall plan. he agrees with it. he will call with any problems that may develop before the next visit here.  Total time spent: 30 mins including face  to face time and time spent for planning, charting and coordination of care  Rulon Eisenmenger, MD, MPH 01/13/2021  I, Thana Ates, am acting as scribe for Dr. Nicholas Lose.  I have reviewed the above documentation for accuracy and completeness, and I agree with the above.

## 2021-01-13 ENCOUNTER — Inpatient Hospital Stay (HOSPITAL_COMMUNITY)
Admission: EM | Admit: 2021-01-13 | Discharge: 2021-01-16 | DRG: 963 | Disposition: A | Discharge status: Hospice | Payer: Medicare Other | Attending: Internal Medicine | Admitting: Internal Medicine

## 2021-01-13 ENCOUNTER — Encounter (HOSPITAL_COMMUNITY): Payer: Self-pay | Admitting: Emergency Medicine

## 2021-01-13 ENCOUNTER — Inpatient Hospital Stay: Payer: Medicare Other

## 2021-01-13 ENCOUNTER — Ambulatory Visit (INDEPENDENT_AMBULATORY_CARE_PROVIDER_SITE_OTHER): Payer: Medicare Other | Admitting: *Deleted

## 2021-01-13 ENCOUNTER — Emergency Department (HOSPITAL_COMMUNITY): Payer: Medicare Other

## 2021-01-13 ENCOUNTER — Inpatient Hospital Stay: Payer: Medicare Other | Admitting: Hematology and Oncology

## 2021-01-13 ENCOUNTER — Other Ambulatory Visit: Payer: Self-pay

## 2021-01-13 DIAGNOSIS — Z888 Allergy status to other drugs, medicaments and biological substances status: Secondary | ICD-10-CM

## 2021-01-13 DIAGNOSIS — S3210XA Unspecified fracture of sacrum, initial encounter for closed fracture: Secondary | ICD-10-CM | POA: Diagnosis present

## 2021-01-13 DIAGNOSIS — M4854XA Collapsed vertebra, not elsewhere classified, thoracic region, initial encounter for fracture: Secondary | ICD-10-CM | POA: Diagnosis present

## 2021-01-13 DIAGNOSIS — R54 Age-related physical debility: Secondary | ICD-10-CM | POA: Diagnosis present

## 2021-01-13 DIAGNOSIS — R410 Disorientation, unspecified: Secondary | ICD-10-CM | POA: Diagnosis present

## 2021-01-13 DIAGNOSIS — Z951 Presence of aortocoronary bypass graft: Secondary | ICD-10-CM

## 2021-01-13 DIAGNOSIS — W19XXXA Unspecified fall, initial encounter: Secondary | ICD-10-CM | POA: Diagnosis present

## 2021-01-13 DIAGNOSIS — D46Z Other myelodysplastic syndromes: Secondary | ICD-10-CM | POA: Diagnosis present

## 2021-01-13 DIAGNOSIS — E119 Type 2 diabetes mellitus without complications: Secondary | ICD-10-CM

## 2021-01-13 DIAGNOSIS — D72829 Elevated white blood cell count, unspecified: Secondary | ICD-10-CM | POA: Diagnosis present

## 2021-01-13 DIAGNOSIS — D638 Anemia in other chronic diseases classified elsewhere: Secondary | ICD-10-CM | POA: Diagnosis present

## 2021-01-13 DIAGNOSIS — R791 Abnormal coagulation profile: Secondary | ICD-10-CM | POA: Diagnosis present

## 2021-01-13 DIAGNOSIS — I5032 Chronic diastolic (congestive) heart failure: Secondary | ICD-10-CM | POA: Diagnosis present

## 2021-01-13 DIAGNOSIS — M8580 Other specified disorders of bone density and structure, unspecified site: Secondary | ICD-10-CM | POA: Diagnosis present

## 2021-01-13 DIAGNOSIS — R627 Adult failure to thrive: Secondary | ICD-10-CM | POA: Diagnosis present

## 2021-01-13 DIAGNOSIS — M533 Sacrococcygeal disorders, not elsewhere classified: Secondary | ICD-10-CM | POA: Diagnosis present

## 2021-01-13 DIAGNOSIS — E43 Unspecified severe protein-calorie malnutrition: Secondary | ICD-10-CM | POA: Insufficient documentation

## 2021-01-13 DIAGNOSIS — D696 Thrombocytopenia, unspecified: Secondary | ICD-10-CM | POA: Diagnosis present

## 2021-01-13 DIAGNOSIS — Y92009 Unspecified place in unspecified non-institutional (private) residence as the place of occurrence of the external cause: Secondary | ICD-10-CM

## 2021-01-13 DIAGNOSIS — E782 Mixed hyperlipidemia: Secondary | ICD-10-CM | POA: Diagnosis present

## 2021-01-13 DIAGNOSIS — I110 Hypertensive heart disease with heart failure: Secondary | ICD-10-CM | POA: Diagnosis present

## 2021-01-13 DIAGNOSIS — Z781 Physical restraint status: Secondary | ICD-10-CM

## 2021-01-13 DIAGNOSIS — I4891 Unspecified atrial fibrillation: Secondary | ICD-10-CM | POA: Diagnosis not present

## 2021-01-13 DIAGNOSIS — Z66 Do not resuscitate: Secondary | ICD-10-CM | POA: Diagnosis present

## 2021-01-13 DIAGNOSIS — Z7984 Long term (current) use of oral hypoglycemic drugs: Secondary | ICD-10-CM

## 2021-01-13 DIAGNOSIS — Z7189 Other specified counseling: Secondary | ICD-10-CM | POA: Diagnosis not present

## 2021-01-13 DIAGNOSIS — I482 Chronic atrial fibrillation, unspecified: Secondary | ICD-10-CM | POA: Diagnosis present

## 2021-01-13 DIAGNOSIS — Y929 Unspecified place or not applicable: Secondary | ICD-10-CM | POA: Diagnosis not present

## 2021-01-13 DIAGNOSIS — Z4659 Encounter for fitting and adjustment of other gastrointestinal appliance and device: Secondary | ICD-10-CM

## 2021-01-13 DIAGNOSIS — I2510 Atherosclerotic heart disease of native coronary artery without angina pectoris: Secondary | ICD-10-CM | POA: Diagnosis present

## 2021-01-13 DIAGNOSIS — Z885 Allergy status to narcotic agent status: Secondary | ICD-10-CM | POA: Diagnosis not present

## 2021-01-13 DIAGNOSIS — S065XAA Traumatic subdural hemorrhage with loss of consciousness status unknown, initial encounter: Secondary | ICD-10-CM | POA: Diagnosis present

## 2021-01-13 DIAGNOSIS — Z515 Encounter for palliative care: Secondary | ICD-10-CM | POA: Diagnosis not present

## 2021-01-13 DIAGNOSIS — S22080A Wedge compression fracture of T11-T12 vertebra, initial encounter for closed fracture: Secondary | ICD-10-CM | POA: Diagnosis present

## 2021-01-13 DIAGNOSIS — F439 Reaction to severe stress, unspecified: Secondary | ICD-10-CM | POA: Diagnosis present

## 2021-01-13 DIAGNOSIS — Z7901 Long term (current) use of anticoagulants: Secondary | ICD-10-CM | POA: Diagnosis not present

## 2021-01-13 DIAGNOSIS — Z79899 Other long term (current) drug therapy: Secondary | ICD-10-CM

## 2021-01-13 DIAGNOSIS — Z8249 Family history of ischemic heart disease and other diseases of the circulatory system: Secondary | ICD-10-CM

## 2021-01-13 DIAGNOSIS — D649 Anemia, unspecified: Secondary | ICD-10-CM

## 2021-01-13 DIAGNOSIS — Z6823 Body mass index (BMI) 23.0-23.9, adult: Secondary | ICD-10-CM

## 2021-01-13 DIAGNOSIS — Z720 Tobacco use: Secondary | ICD-10-CM

## 2021-01-13 DIAGNOSIS — Z20822 Contact with and (suspected) exposure to covid-19: Secondary | ICD-10-CM | POA: Diagnosis present

## 2021-01-13 DIAGNOSIS — R296 Repeated falls: Secondary | ICD-10-CM | POA: Diagnosis present

## 2021-01-13 DIAGNOSIS — I10 Essential (primary) hypertension: Secondary | ICD-10-CM | POA: Diagnosis present

## 2021-01-13 DIAGNOSIS — R4587 Impulsiveness: Secondary | ICD-10-CM | POA: Diagnosis present

## 2021-01-13 DIAGNOSIS — G3184 Mild cognitive impairment, so stated: Secondary | ICD-10-CM | POA: Diagnosis present

## 2021-01-13 LAB — COMPREHENSIVE METABOLIC PANEL
ALT: 28 U/L (ref 0–44)
AST: 27 U/L (ref 15–41)
Albumin: 3.3 g/dL — ABNORMAL LOW (ref 3.5–5.0)
Alkaline Phosphatase: 111 U/L (ref 38–126)
Anion gap: 9 (ref 5–15)
BUN: 23 mg/dL (ref 8–23)
CO2: 25 mmol/L (ref 22–32)
Calcium: 8.5 mg/dL — ABNORMAL LOW (ref 8.9–10.3)
Chloride: 101 mmol/L (ref 98–111)
Creatinine, Ser: 0.94 mg/dL (ref 0.61–1.24)
GFR, Estimated: >60 mL/min (ref >60)
Glucose, Bld: 120 mg/dL — ABNORMAL HIGH (ref 70–99)
Potassium: 4.2 mmol/L (ref 3.5–5.1)
Sodium: 135 mmol/L (ref 135–145)
Total Bilirubin: 1.8 mg/dL — ABNORMAL HIGH (ref 0.3–1.2)
Total Protein: 7.1 g/dL (ref 6.5–8.1)

## 2021-01-13 LAB — CBC WITH DIFFERENTIAL (CANCER CENTER ONLY)
Abs Immature Granulocytes: 1.39 10*3/uL — ABNORMAL HIGH (ref 0.00–0.07)
Basophils Absolute: 0.1 10*3/uL (ref 0.0–0.1)
Basophils Relative: 1 %
Eosinophils Absolute: 0 10*3/uL (ref 0.0–0.5)
Eosinophils Relative: 0 %
HCT: 24.2 % — ABNORMAL LOW (ref 39.0–52.0)
Hemoglobin: 7.7 g/dL — ABNORMAL LOW (ref 13.0–17.0)
Immature Granulocytes: 11 %
Lymphocytes Relative: 5 %
Lymphs Abs: 0.7 10*3/uL (ref 0.7–4.0)
MCH: 30.9 pg (ref 26.0–34.0)
MCHC: 31.8 g/dL (ref 30.0–36.0)
MCV: 97.2 fL (ref 80.0–100.0)
Monocytes Absolute: 1.9 10*3/uL — ABNORMAL HIGH (ref 0.1–1.0)
Monocytes Relative: 15 %
Neutro Abs: 8.7 10*3/uL — ABNORMAL HIGH (ref 1.7–7.7)
Neutrophils Relative %: 68 %
Platelet Count: 77 10*3/uL — ABNORMAL LOW (ref 150–400)
RBC: 2.49 MIL/uL — ABNORMAL LOW (ref 4.22–5.81)
RDW: 25.7 % — ABNORMAL HIGH (ref 11.5–15.5)
WBC Count: 12.8 10*3/uL — ABNORMAL HIGH (ref 4.0–10.5)
nRBC: 1.4 % — ABNORMAL HIGH (ref 0.0–0.2)

## 2021-01-13 LAB — URINALYSIS, ROUTINE W REFLEX MICROSCOPIC
Bilirubin Urine: NEGATIVE
Glucose, UA: NEGATIVE mg/dL
Ketones, ur: 20 mg/dL — AB
Nitrite: POSITIVE — AB
Protein, ur: 30 mg/dL — AB
Specific Gravity, Urine: 1.019 (ref 1.005–1.030)
pH: 5 (ref 5.0–8.0)

## 2021-01-13 LAB — CBG MONITORING, ED: Glucose-Capillary: 108 mg/dL — ABNORMAL HIGH (ref 70–99)

## 2021-01-13 LAB — PROTIME-INR
INR: >10 (ref 0.8–1.2)
INR: 1.3 — ABNORMAL HIGH (ref 0.8–1.2)
Prothrombin Time: >90 seconds — ABNORMAL HIGH (ref 11.4–15.2)
Prothrombin Time: 16.5 seconds — ABNORMAL HIGH (ref 11.4–15.2)

## 2021-01-13 LAB — SAMPLE TO BLOOD BANK

## 2021-01-13 LAB — RESP PANEL BY RT-PCR (FLU A&B, COVID) ARPGX2
Influenza A by PCR: NEGATIVE
Influenza B by PCR: NEGATIVE
SARS Coronavirus 2 by RT PCR: NEGATIVE

## 2021-01-13 MED ORDER — PROTHROMBIN COMPLEX CONC HUMAN 500 UNITS IV KIT
2097.0000 [IU] | PACK | Status: AC
  Administered 2021-01-13: 16:00:00 2097 [IU] via INTRAVENOUS
  Filled 2021-01-13: qty 2097

## 2021-01-13 MED ORDER — ACETAMINOPHEN 325 MG PO TABS: 650.0000 mg | ORAL_TABLET | ORAL | Status: DC | PRN

## 2021-01-13 MED ORDER — HALOPERIDOL LACTATE 5 MG/ML IJ SOLN
2.0000 mg | Freq: Four times a day (QID) | INTRAMUSCULAR | Status: DC | PRN
  Administered 2021-01-13 – 2021-01-16 (×5): 2 mg via INTRAVENOUS
  Filled 2021-01-13 (×6): qty 1

## 2021-01-13 MED ORDER — ACETAMINOPHEN 650 MG RE SUPP
650.0000 mg | RECTAL | Status: DC | PRN
  Administered 2021-01-13: 23:00:00 650 mg via RECTAL
  Filled 2021-01-13: qty 1

## 2021-01-13 MED ORDER — DEXTROSE-NACL 5-0.9 % IV SOLN: INTRAVENOUS | Status: DC

## 2021-01-13 MED ORDER — PANTOPRAZOLE SODIUM 40 MG IV SOLR
40.0000 mg | Freq: Every day | INTRAVENOUS | Status: DC
  Administered 2021-01-13 – 2021-01-14 (×2): 40 mg via INTRAVENOUS
  Filled 2021-01-13 (×2): qty 40

## 2021-01-13 MED ORDER — DARBEPOETIN ALFA 500 MCG/ML IJ SOSY
500.0000 ug | PREFILLED_SYRINGE | Freq: Once | INTRAMUSCULAR | Status: AC
  Administered 2021-01-13: 12:00:00 500 ug via SUBCUTANEOUS
  Filled 2021-01-13: qty 1

## 2021-01-13 MED ORDER — ACETAMINOPHEN 160 MG/5ML PO SOLN
650.0000 mg | ORAL | Status: DC | PRN
  Administered 2021-01-15: 14:00:00 650 mg
  Filled 2021-01-13: qty 20.3

## 2021-01-13 MED ORDER — PHYTONADIONE 5 MG PO TABS
2.5000 mg | ORAL_TABLET | Freq: Once | ORAL | Status: DC
  Filled 2021-01-13: qty 1

## 2021-01-13 MED ORDER — STROKE: EARLY STAGES OF RECOVERY BOOK
Freq: Once | Status: DC
  Filled 2021-01-13: qty 1

## 2021-01-13 MED ORDER — VITAMIN K1 10 MG/ML IJ SOLN
10.0000 mg | INTRAVENOUS | Status: AC
  Administered 2021-01-13: 15:00:00 10 mg via INTRAVENOUS
  Filled 2021-01-13: qty 1

## 2021-01-13 NOTE — Assessment & Plan Note (Addendum)
-   On Coumadin chronically; wife reports poor appetite lately and patient has not been eating; this may be contributing to his supratherapeutic level noted prior to admission -INR has been reversed on admission; down to 1.4 on 12/21 - Continue holding Coumadin - No further anticoagulation as we are now transitioning to comfort care

## 2021-01-13 NOTE — Assessment & Plan Note (Addendum)
-   lasix on hold - s/p IVF on admission  - no s/s exac

## 2021-01-13 NOTE — Assessment & Plan Note (Addendum)
-   Check A1c, 5.8%

## 2021-01-13 NOTE — Assessment & Plan Note (Signed)
Patient is on Coumadin for A. fib. Currently also receiving Revlimid and Aranesp putting the patient at high risk for thrombotic events. Presents with supratherapeutic INR and a fall leading to subdural hematoma. INR more than 10, currently being reversed with Kcentra as well as vitamin K. Poor candidate for resumption of anticoagulation in future.  But very high risk for developing thrombosis.

## 2021-01-13 NOTE — Assessment & Plan Note (Addendum)
Wife reports that ever since patient has been started on the Revlimid he has poor p.o. intake.  Which has worsened ever since he had a fall. Prognosis poor.

## 2021-01-13 NOTE — Assessment & Plan Note (Addendum)
-   see MDS

## 2021-01-13 NOTE — Assessment & Plan Note (Addendum)
-   comfort care

## 2021-01-13 NOTE — ED Notes (Signed)
Patient returned back from CT at this time.  °

## 2021-01-13 NOTE — Assessment & Plan Note (Addendum)
-   Initially suspected due to reactive in nature however continues to have elevated bandemia (some possible confounding from MDS).  Given his decreased mentation, at risk for aspiration -Initially was going to order Unasyn however after discussion with oncology and patient's wife, we will now transition to comfort care

## 2021-01-13 NOTE — Assessment & Plan Note (Signed)
Seen on the CT scan.  Currently no indication for anticoagulation or antiplatelet therapy

## 2021-01-13 NOTE — Patient Instructions (Signed)
Description   Spoke with [facility] that reported INR and advised pt to hold Warfarin and seek treatment in the ER. Also, spoke with wife and advised pt to hold Warfarin until seen in the ER and to call with updated instructions. Wife aware will need to check INR this week Normal dose: 5 mg daily except 7.5 mg on Tuesdays, Thursdays and Saturdays. Recheck INR once discharged. Coumadin Clinic [phone] Main [phone]

## 2021-01-13 NOTE — ED Triage Notes (Signed)
Patient sent from Cancer center for INR > 10.0. patient denies any bleeding or increased weakness. C/o back pain from recent fall with back fracture. Currently on pain medication that makes him a little drowsy according to wife at bedside.

## 2021-01-13 NOTE — ED Provider Notes (Signed)
[facility] DEPT Provider Note   CSN: [id number] Arrival date & time: 01/13/21  1223     History Chief Complaint  Patient presents with   Abnormal Labs    INR    Jordan Clayton is a 80 y.o. male.  HPI 80 year old male presents after labs revealed a supratherapeutic INR.  Patient had his Coumadin dose increased 2 weeks ago after it was subtherapeutic.  He is being treated for chronic atrial fibrillation.  He fell on 12/12 and broke his sacrum.  He has been on hydrocodone since then and has been sleepier due to that.  He has not been eating and drinking very well.  Otherwise no new medicines.  Had a 4 out of 10 headache yesterday.  No headache today.  No other acute complaints and no abdominal pain.  His sacrum continues to hurt but no new pain. He has not been altered.  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   Normochromic normocytic anemia 11/25/2020   Sepsis secondary to UTI ([place]) 07/21/2016   Coronary artery disease 08/31/2011   Long term (current) use of anticoagulants 12/16/2010   EDEMA 02/09/2010   HYPERNATREMIA 09/12/2009   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   DELIRIUM 09/12/2009   SEPTIC SHOCK 09/12/2009   DM2 (diabetes mellitus, type 2) ([place]) 03/21/2009   HYPERLIPIDEMIA-MIXED 03/21/2009   Essential hypertension 03/21/2009   ATRIAL FIBRILLATION, CHRONIC 03/21/2009    Past Surgical History:  Procedure Laterality Date   CORONARY ARTERY BYPASS GRAFT  1994   LIMA to LAD per [facility]        Family History  Problem Relation Age of Onset   Coronary artery disease Other        family hx   Hypertension Other        family hx    Social History   Tobacco Use   Smoking status: Never   Smokeless tobacco: Current    Types: Chew  Substance Use Topics   Alcohol use: No    Alcohol/week: 0.0 standard drinks   Drug use: No    Home Medications Prior to Admission medications   Medication Sig Start Date End Date Taking? Authorizing Provider  amLODipine (NORVASC) 5 MG tablet Take 1 tablet (5 mg total) by mouth daily. SCHEDULE OFFICE VISIT FOR FUTURE REFILL 01/01/21   Lelon Perla, MD  Coenzyme Q10 (COQ10 PO) Take 100 mg by mouth daily.    [provider]  folic acid (FOLVITE) 017 MCG tablet Take 400 mcg by mouth daily. 1/2 tab daily    [provider]  furosemide (LASIX) 40 MG tablet Take 1 tablet (40 mg total) by mouth daily. Daily as needed for swelling 07/23/16   Patrecia Pour, Christean Grief, MD  HYDROcodone-acetaminophen (NORCO/VICODIN) 5-325 MG tablet Take 1 tablet by mouth as directed. 01/01/20   [provider]  HYDROcodone-acetaminophen (NORCO/VICODIN) 5-325 MG tablet Take 1 tablet by mouth every 6 (six) hours as needed for moderate pain.  01/05/21   Fredia Sorrow, MD  lenalidomide (REVLIMID) 2.5 MG capsule Take 1 capsule (2.5 mg total) by mouth daily. 1 capsule for 21 days on and 7 days off. Repeat every 28 days. 12/16/20   Nicholas Lose, MD  metFORMIN (GLUCOPHAGE) 1000 MG tablet Take 1,000 mg by mouth 2 (two) times daily.    [provider]  metoprolol tartrate (LOPRESSOR) 25 MG tablet Take 1 tablet by mouth twice daily 05/13/20   Lelon Perla, MD  Multiple Vitamin (MULTIVITAMIN) tablet Take 1 tablet by mouth daily.    [provider]  Multiple Vitamins-Minerals (PRESERVISION AREDS 2 PO) Take by mouth daily.    [provider]  nitroGLYCERIN (NITROSTAT) 0.4 MG SL tablet Place 1 tablet (0.4 mg total) under the  tongue every 5 (five) minutes as needed (up to 3 tablets). 11/13/19   Lelon Perla, MD  pravastatin (PRAVACHOL) 40 MG tablet Take 40 mg by mouth at bedtime.     [provider]  quinapril (ACCUPRIL) 40 MG tablet Take 1 tablet by mouth once daily with breakfast 04/17/20   Lelon Perla, MD  Thiamine HCl (VITAMIN B-1) 100 MG tablet Take 100 mg by mouth daily.    [provider]  vitamin B-12 (CYANOCOBALAMIN) 250 MCG tablet Take 250 mcg by mouth daily.    [provider]  warfarin (COUMADIN) 5 MG tablet TAKE 1 TABLET BY MOUTH ONCE DAILY OR AS DIRECTED BY COUMADIN CLINIC Patient not taking: Reported on 12/10/2020 10/09/20   Lelon Perla, MD  warfarin (COUMADIN) 7.5 MG tablet TAKE AS DIRECTED BY COUMADIN CLINIC 12/22/20   Lelon Perla, MD    Allergies    Atorvastatin, Oxycodone-acetaminophen, Quetiapine, and Rosuvastatin  Review of Systems   Review of Systems  Constitutional:  Positive for fatigue.  Cardiovascular:  Negative for chest pain.  Gastrointestinal:  Negative for abdominal pain.  Musculoskeletal:  Positive for back pain.  Neurological:  Positive for weakness and headaches.  All other systems reviewed and are negative.  Physical Exam Updated Vital Signs BP 133/79    Pulse 89    Temp 98.1 F (36.7 C) (Oral)    Resp 16    SpO2 92%   Physical Exam Vitals and nursing note reviewed.  Constitutional:      General: He is not in acute distress.    Appearance: He is well-developed. He is not ill-appearing or diaphoretic.     Comments: Patient is sleepy, but easily awakens and converses normally  HENT:     Head: Normocephalic and atraumatic.     Right Ear: External ear normal.     Left Ear: External ear normal.     Nose: Nose normal.  Eyes:     General:        Right eye: No discharge.        Left eye: No discharge.     Extraocular Movements: Extraocular movements intact.     Pupils: Pupils are equal, round, and reactive to light.   Cardiovascular:     Rate and Rhythm: Normal rate and regular rhythm.     Heart sounds: Normal heart sounds.  Pulmonary:     Effort: Pulmonary effort is normal.     Breath sounds: Normal breath sounds.  Abdominal:     Palpations: Abdomen is soft.     Tenderness: There is no abdominal tenderness.  Musculoskeletal:     Cervical back: Neck supple.  Skin:    General: Skin is warm and dry.  Neurological:     Comments: CN 3-12 grossly intact. 5/5 strength in all 4 extremities. Grossly normal sensation. Normal finger to nose.   Psychiatric:        Mood and Affect: Mood is not anxious.    ED Results / Procedures / Treatments   Labs (all labs ordered are listed, but only abnormal results are displayed) Labs Reviewed  COMPREHENSIVE METABOLIC PANEL - Abnormal; Notable for the following components:      Result Value   Glucose, Bld 120 (*)    Calcium 8.5 (*)    Albumin 3.3 (*)    Total Bilirubin 1.8 (*)    All other components within normal limits  CBG MONITORING, ED - Abnormal; Notable for the following components:   Glucose-Capillary 108 (*)    All other components within normal limits  RESP PANEL BY RT-PCR (FLU A&B, COVID) ARPGX2  URINALYSIS, ROUTINE W REFLEX MICROSCOPIC  TYPE AND SCREEN    EKG None  Radiology CT HEAD WO CONTRAST (5MM)  Result Date: 01/13/2021 CLINICAL DATA:  Headache EXAM: CT HEAD WITHOUT CONTRAST TECHNIQUE: Contiguous axial images were obtained from the base of the skull through the vertex without intravenous contrast. COMPARISON:  Head CT 07/01/2017 FINDINGS: Brain: There is a mixed density right cerebral convexity subdural hematoma with acute blood products seen over the right frontal lobe and layering along the falx and right tentorial leaflet. Acute blood products overlying the right temporal lobe posteriorly measure up to 9 mm in the coronal plane. The acute blood overlying the right frontal lobe measures up to 8 mm along the roof of the right orbit. There  is a layering hematocrit level anteriorly The collection exerts mild mass effect on the underlying brain parenchyma with no midline shift. There is no acute extra-axial collection on the left. There is no evidence of acute territorial infarct. There is a background of mild global parenchymal volume loss. The ventricles are normal in size. Patchy hypodensity throughout the subcortical and periventricular white matter likely reflects sequela of chronic white matter microangiopathy. There is no solid mass lesion. Vascular: There is calcification of the bilateral cavernous ICAs and vertebral arteries. Skull: Normal. Negative for fracture or focal lesion. Sinuses/Orbits: There is moderate mucosal thickening in the right sphenoid sinus with aerated fluid. The globes and orbits are unremarkable. Other: None. 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 The hematoma exerts mild mass effect on the underlying brain parenchyma with no midline shift. These results were called by telephone at the time of interpretation on 01/13/2021 at 2:04 pm to provider Sherwood Gambler , who verbally acknowledged these results. Electronically Signed   By: Valetta Mole M.D.   On: 01/13/2021 14:07    Procedures .Critical Care Performed by: Sherwood Gambler, MD Authorized by: Sherwood Gambler, MD   Critical care provider statement:    Critical care time (minutes):  40   Critical care time was exclusive of:  Separately billable procedures and treating other patients   Critical care was necessary to treat or prevent imminent or life-threatening deterioration of the following conditions:  CNS failure or compromise   Critical care was time spent personally by me on the following activities:  Development of treatment plan with patient or surrogate, discussions with consultants, evaluation  of patient's response to treatment, examination of patient, ordering and review of laboratory studies, ordering and review of radiographic studies,  ordering and performing treatments and interventions, pulse oximetry, re-evaluation of patient's condition and review of old charts   Medications Ordered in ED Medications  prothrombin complex conc human (KCENTRA) IVPB 2,097 Units (has no administration in time range)  phytonadione (VITAMIN K) 10 mg in dextrose 5 % 50 mL IVPB (has no administration in time range)    ED Course  I have reviewed the triage vital signs and the nursing notes.  Pertinent labs & imaging results that were available during my care of the patient were reviewed by me and considered in my medical decision making (see chart for details).    MDM Rules/Calculators/A&P                         Labs from outpatient hematology/oncology as well as from the emergency department have been reviewed.  With his supratherapeutic INR, recent headache, and a little bit of lethargy, CT head was obtained.  Has been personally reviewed and shows small subdural hematoma, acute on subacute.  Discussed with neurosurgery, Dr. Kathyrn Sheriff, who agrees with reversal but there is no indication for surgery.  He recommends at least stepdown for neuro monitoring and repeat head CT in 12-24 hours.  Does not feel like he has to come to [facility].  Otherwise I suspect the recent hydrocodone prescription is causing him to be sleepier than normal as this correlates with the history given by the wife.  However is not really altered and he is currently protecting his airway and easily awakens. Discussed with Dr. Posey Pronto.     Final Clinical Impression(s) / ED Diagnoses Final diagnoses:  Supratherapeutic INR  Subdural hematoma    Rx / DC Orders ED Discharge Orders     None        Sherwood Gambler, MD 01/13/21 [phone]

## 2021-01-13 NOTE — Assessment & Plan Note (Signed)
11/15/2019: Hemoglobin 11.7 10/16/2020: Hemoglobin 8.9 11/25/2020: Hemoglobin 6.8(2 units of PRBC given) DAT: Negative, haptoglobin 657, folic acid 43, absolute reticulocyte count 66, immature reticulocyte fraction 22.2, reticulocyte percent: 3.1%, iron saturation 16%, ferritin 665, erythropoietin 249, creatinine 1.33, bilirubin 2.8, B12 618, platelets 71 12/01/2020: Hemoglobin 8.3, platelets 61 WBC 4.3, dysplastic neutrophils are present, Meta and myelocytes noted on smear. 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 Minus  Treatment plan: 1.  Aranesp every 4 weeks and blood transfusions as needed 2. Revlimid 2.5 mg 21 days on 7 days off to start as soon as the medicine becomes available.  Return to clinic in 4 weeks for labs

## 2021-01-13 NOTE — ED Notes (Signed)
Unable to complete stroke swallow screen at this time due to the fact that patient is unable to follow directions appropriately at this time.

## 2021-01-13 NOTE — Assessment & Plan Note (Addendum)
-   See delirium as well

## 2021-01-13 NOTE — Assessment & Plan Note (Addendum)
-   continue pain/comfort measures

## 2021-01-13 NOTE — Assessment & Plan Note (Addendum)
-   due to MDS

## 2021-01-13 NOTE — Assessment & Plan Note (Addendum)
-   s/p fall - continue comfort care

## 2021-01-13 NOTE — ED Provider Notes (Signed)
Emergency Medicine Provider Triage Evaluation Note  Jordan Clayton , a 80 y.o. male  was evaluated in triage.  Pt complains of elevated INR.  80 year old male sent to the emergency department from his oncology department for INR greater than 10.  Labs reviewed this morning show white blood cell count of 12.8, hemoglobin of 7.7, INR reading higher than 10.  He denies any black or tarry stools.  He has no more lethargic and weak than his normal state.  He has a history of atrial fibrillation and myelodysplastic syndrome.  Review of Systems  Positive: WEAKNESS, ABNL LAB Negative: FEVER  Physical Exam  BP 128/68 (BP Location: Right Arm)    Pulse 86    Temp 98.1 F (36.7 C) (Oral)    Resp 18    SpO2 97%  Gen:   lethargic Resp:  Normal effort  MSK:   Moves extremities without difficulty  Other:  pale  Medical Decision Making  Medically screening exam initiated at 12:38 PM.  Appropriate orders placed.  JULLIAN CLAYSON was informed that the remainder of the evaluation will be completed by another provider, this initial triage assessment does not replace that evaluation, and the importance of remaining in the ED until their evaluation is complete.  CMP added. Will need a room for tratement   Ned Grace 01/13/21 1241    Lacretia Leigh, MD 01/14/21 [phone]

## 2021-01-13 NOTE — H&P (Signed)
History and Physical    Jordan Clayton IEP:[id number] DOB: 08-31-1940 DOA: 01/13/2021  PCP: Jordan Downing, Jordan Clayton   Chief Complaint: Confusion  HPI: Jordan Clayton is a 80 y.o. male with medical history significant of myelodysplastic syndrome, CAD, chronic A. fib on anticoagulation, type II DM, HTN, HLD, anemia and thrombocytopenia. Patient presented to the cancer center for follow-up.  Where he was found to have supratherapeutic INR. Patient was also somewhat confused and therefore was referred to ER for further work-up. Patient was recently seen in ER on 12/12 after a fall which led to a compression fracture. Patient was started on Norco for pain control. Per wife ever since the fall patient is unable to tolerate anything p.o. due to excessive sleepiness and confusion.  As well as constipation. Patient's condition has been progressively worsening over last few weeks ever since he is on Revlimid as well per wife. No other recent change in medication reported by the family. Last dose of Aranesp was in the clinic today on 12/20.  No fever no chills.  Not diarrhea reported as well.  Review of Systems: ROS   As per HPI otherwise 10 point review of systems negative.   Allergies  Allergen Reactions   Atorvastatin Other (See Comments)    unknown   Oxycodone-Acetaminophen     REACTION: Hallucinations   Quetiapine     REACTION: disorientated   Rosuvastatin Other (See Comments)     unknown    Past Medical History:  Diagnosis Date   Acute MI ([place])    subendocardial   CAD (coronary artery disease)    Chronic atrial fibrillation (HCC)    Delirium    DM2 (diabetes mellitus, type 2) (HCC)    Drug therapy    coumadin   HTN (hypertension)    Hypernatremia    MDS (myelodysplastic syndrome), high grade ([place]) 12/09/2020   Mixed hyperlipidemia    Septic shock(785.52)     Past Surgical History:  Procedure Laterality Date   CORONARY ARTERY BYPASS GRAFT  1994   LIMA to LAD per  [facility]     reports that he has never smoked. His smokeless tobacco use includes chew. He reports that he does not drink alcohol and does not use drugs.  Family History  Problem Relation Age of Onset   Coronary artery disease Other        family hx   Hypertension Other        family hx    Prior to Admission medications   Medication Sig Start Date End Date Taking? Authorizing Provider  amLODipine (NORVASC) 5 MG tablet Take 1 tablet (5 mg total) by mouth daily. SCHEDULE OFFICE VISIT FOR FUTURE REFILL Patient taking differently: Take 5 mg by mouth daily. 01/01/21  Yes Lelon Perla, Jordan Clayton  Coenzyme Q10 (COQ10 PO) Take 100 mg by mouth daily.   Yes Provider, Historical, Jordan Clayton  folic acid (FOLVITE) 660 MCG tablet Take 400 mcg by mouth daily.   Yes Provider, Historical, Jordan Clayton  furosemide (LASIX) 40 MG tablet Take 1 tablet (40 mg total) by mouth daily. Daily as needed for swelling Patient taking differently: Take 40 mg by mouth daily as needed for fluid. 07/23/16  Yes Patrecia Pour, Christean Grief, Jordan Clayton  HYDROcodone-acetaminophen (NORCO/VICODIN) 5-325 MG tablet Take 1 tablet by mouth every 6 (six) hours as needed for severe pain. 01/01/20  Yes Provider, Historical, Jordan Clayton  lenalidomide (REVLIMID) 2.5 MG capsule Take 1 capsule (2.5 mg total) by mouth daily. 1 capsule for 21 days  on and 7 days off. Repeat every 28 days. Patient taking differently: Take 2.5 mg by mouth See admin instructions. 1 capsule for 21 days on and 7 days off. Repeat every 28 days. 12/16/20  Yes Jordan Lose, Jordan Clayton  metFORMIN (GLUCOPHAGE) 1000 MG tablet Take 1,000 mg by mouth 2 (two) times daily.   Yes Provider, Historical, Jordan Clayton  metoprolol tartrate (LOPRESSOR) 25 MG tablet Take 1 tablet by mouth twice daily Patient taking differently: Take 25 mg by mouth 2 (two) times daily. 05/13/20  Yes Lelon Perla, Jordan Clayton  Multiple Vitamin (MULTIVITAMIN) tablet Take 1 tablet by mouth daily.   Yes Provider, Historical, Jordan Clayton  Multiple Vitamins-Minerals (PRESERVISION AREDS 2  PO) Take 1 tablet by mouth daily.   Yes Provider, Historical, Jordan Clayton  nitroGLYCERIN (NITROSTAT) 0.4 MG SL tablet Place 1 tablet (0.4 mg total) under the tongue every 5 (five) minutes as needed (up to 3 tablets). Patient taking differently: Place 0.4 mg under the tongue every 5 (five) minutes as needed for chest pain. 11/13/19  Yes Lelon Perla, Jordan Clayton  pravastatin (PRAVACHOL) 40 MG tablet Take 40 mg by mouth at bedtime.    Yes Provider, Historical, Jordan Clayton  quinapril (ACCUPRIL) 40 MG tablet Take 1 tablet by mouth once daily with breakfast Patient taking differently: Take 40 mg by mouth daily. 04/17/20  Yes Lelon Perla, Jordan Clayton  Thiamine HCl (VITAMIN B-1) 100 MG tablet Take 100 mg by mouth daily.   Yes Provider, Historical, Jordan Clayton  vitamin B-12 (CYANOCOBALAMIN) 250 MCG tablet Take 250 mcg by mouth daily.   Yes Provider, Historical, Jordan Clayton  warfarin (COUMADIN) 5 MG tablet TAKE 1 TABLET BY MOUTH ONCE DAILY OR AS DIRECTED BY COUMADIN CLINIC Patient taking differently: Take 5 mg by mouth See admin instructions. Takes 5 mg by mouth on Sunday, Monday, Wednesday and Friday. 10/09/20  Yes Lelon Perla, Jordan Clayton  warfarin (COUMADIN) 7.5 MG tablet TAKE AS DIRECTED BY COUMADIN CLINIC Patient taking differently: Take 7.5 mg by mouth See admin instructions. Takes 7.5 mg by mouth on Tuesday, Thursday and Saturday 12/22/20  Yes Crenshaw, Denice Bors, Jordan Clayton  HYDROcodone-acetaminophen (NORCO/VICODIN) 5-325 MG tablet Take 1 tablet by mouth every 6 (six) hours as needed for moderate pain. Patient not taking: Reported on 01/13/2021 01/05/21   Fredia Sorrow, Jordan Clayton    Physical Exam: Vitals:   01/13/21 1415 01/13/21 1428 01/13/21 1500 01/13/21 1602  BP:  133/79 (!) 115/48   Pulse: 89 89 74   Resp:  16 15   Temp:      TempSrc:      SpO2:  92% 92%   Weight:    77.1 kg  Height:    5' 10.5" (1.791 m)   General: Appear in mild distress, no Rash; Oral Mucosa Clear, moist. no Abnormal Neck Mass Or lumps, Conjunctiva normal  Cardiovascular: S1  and S2 Present, no Murmur, Respiratory: increased respiratory effort, Bilateral Air entry present and bilateral  Crackles, no wheezes Abdomen: Bowel Sound present, Soft and no tenderness Extremities: trace Pedal edema Neurology: alert and oriented to self, not to time, place, and person affect anxious. no new focal deficit Gait not checked due to patient safety concerns     Labs on Admission: I have personally reviewed the patients's labs and imaging studies.  Assessment/Plan * SDH (subdural hematoma) Presents with confusion after initiation of pain medication. Work-up shows that the patient actually suffering from acute on chronic subdural hematoma in the setting of supratherapeutic INR. Neurosurgery was consulted in the ER. Feels  that the patient does not require any operative intervention right now and would most likely will not require any operative intervention in the future as well. INR is reversed with Kcentra. Repeat CT scan tomorrow. N.p.o. PT OT and speech evaluation. Monitor in the progressive care unit with serial neurochecks.  Thrombocytopenia (HCC) Platelet count 77. Will recheck and transfuse for platelet less than 60.   Anemia in other chronic diseases classified elsewhere Chronic. Transfuse for hemoglobin less than 7.   MDS (myelodysplastic syndrome), high grade ([place]) Follows up with oncology.  Should be notified in the morning. At present on Revlimid for 21 days. Receiving Aranesp. Platelet count improving. Hemoglobin remaining stable. Poor candidate   Long term (current) use of anticoagulants Patient is on Coumadin for A. fib. Currently also receiving Revlimid and Aranesp putting the patient at high risk for thrombotic events. Presents with supratherapeutic INR and a fall leading to subdural hematoma. INR more than 10, currently being reversed with Kcentra as well as vitamin K. Poor candidate for resumption of anticoagulation in future.  But very high  risk for developing thrombosis.  Sacral fracture ([place]) Patient was on Norco for pain control.  PT OT consulted. Currently pain-free but remains confused.  Osteopenia Seen on the CT scan. Also appears to have compression fractures of T4, T12 as well as S4. Placing the patient at high risk of poor outcome in the setting of fall trauma or injury.  T12 compression fracture ([place]) After recent fall. Monitor. Pain control.  ATRIAL FIBRILLATION, CHRONIC On Coumadin. INR currently supratherapeutic. Currently discontinued due to subdural hematoma. Rate controlled.  Monitor.  Failure to thrive in adult Wife reports that ever since patient has been started on the Revlimid he has poor p.o. intake.  Which has worsened ever since he had a fall. Consult dietary. Prognosis poor.   Goals of care, counseling/discussion X-rays in the splint with the patient and the wife. Patient unable to participate in the discussion secondary to his confusion. Wife initially wanted to maintain full code, until the patient is brain dead. Further discussion was done, his myelodysplastic syndrome and the severity of that was explained, his presentation with acute on chronic subdural hematoma was explained as well as his risk for future thrombosis in the setting of lack of anticoagulation was also explained. Patient appears to have mild cognitive imbalance as well as CT scan showing evidence of atrophy there is risk for further worsening of symptom control, delirium and agitation was also explained. After this conversation wife would like to switch to limited code with nose compression and no intubation but treat what is treatable. Palliative care consulted.   Leucocytosis Likely stress reaction.  Monitor.  Mild cognitive impairment Appears to have some chronic dementia. Monitor.  Delirium Remains confused. Likely in the setting of hospitalization as well as pain. Monitor.  Haldol as needed.  Coronary  artery disease Seen on the CT scan.  Currently no indication for anticoagulation or antiplatelet therapy  Essential hypertension Blood pressure relatively stable.  Continue current medication.  Controlled type 2 diabetes mellitus without complication, without long-term current use of insulin (HCC) Check hemoglobin A1c. On metformin.  Currently on hold. Currently on sliding scale insulin.  Chronic diastolic CHF (congestive heart failure) (HCC) On Lasix. Currently appears to be clinically hypovolemic and dry. Giving IV hydration.  Monitor.      Code Status: Partial Code   DVT Prophylaxis:  SCD's Start: 01/13/21 1655   Family Communication: Wife at bedside. Admission status: Inpatient Progressive  Certification: The appropriate patient status for this patient is INPATIENT. Inpatient status is judged to be reasonable and necessary in order to provide the required intensity of service to ensure the patient's safety. The patient's presenting symptoms, physical exam findings, and initial radiographic and laboratory data in the context of their chronic comorbidities is felt to place them at high risk for further clinical deterioration. Furthermore, it is not anticipated that the patient will be medically stable for discharge from the hospital within 2 midnights of admission.   * I certify that at the point of admission it is my clinical judgment that the patient will require inpatient hospital care spanning beyond 2 midnights from the point of admission due to high intensity of service, high risk for further deterioration and high frequency of surveillance required.Berle Mull Jordan Clayton [facility] If 7PM-7AM, please contact night-coverage [email or web address]  01/13/2021, 8:38 PM

## 2021-01-13 NOTE — Assessment & Plan Note (Addendum)
-   continue delirium precautions - moreso at risk for encephalopathy from SDH, poor intake, underlying MDS treatment and overall failure to thrive recently; some possible contribution from morphine as well

## 2021-01-13 NOTE — Progress Notes (Signed)
INR >10.0 called to lauren chilcott at 1040 by hflynt 01/13/21

## 2021-01-13 NOTE — Assessment & Plan Note (Addendum)
-   noted on 12/20 [facility]; repeat [place] on 12/21 shows stable SDH - d/c bedrest - okay to work with PT/OT -In general this speaks to his ongoing failure to thrive with his recurrent falls and underlying compression fractures -Discussed with wife at bedside as well

## 2021-01-13 NOTE — Progress Notes (Signed)
°  NEUROSURGERY PROGRESS NOTE   Called about pt in ED. Has hx of myelodysplastic syndrome, chronic A-fib on coumadin, HTN, DM. [place] about a week ago, sent in today from hem/onc clinic with INR>10. Has recently been on hydrocodone after fall last week, has been somewhat somnolent, and CT head obtained and personally reviewed. This demonstrated right acute on chronic SDH with minimal associated mass effect with the degree of underlying brain atrophy. No MLS, no HCP.  Pt does not require any operative intervention at this point, and is unlikely to require any operative intervention in the near future given the subacute nature of his fall. Would recommend admission to internal medicine service, suggest stepdown level of care for neurologic observation. Reverse INR and repeat CTH in 12-24hrs to assess for stability.  Consuella Lose, MD [facility]

## 2021-01-13 NOTE — ED Notes (Signed)
Patient transported to CT 

## 2021-01-13 NOTE — ED Notes (Signed)
RN aware of elevated vs

## 2021-01-13 NOTE — Progress Notes (Signed)
Pt's wife states she did not get a chance to take pt to coumadin clinic 12/19 for scheduled appt and asked if [facility] could draw PT/INR. Per MD ok to add order.   Pt's results critical. Called coumadin clinic to make them aware. They are not able to treat or reverse at clinical and state pt needs to go to ED. Informed pt's wife to take pt to ED. Wife agreed.

## 2021-01-13 NOTE — ED Notes (Signed)
Pt linen changed, brief changed and pure wick placed. Pt temperature taken rectally and is 102.3 at this time. Attending physician paged.   Pt still confused at this time and difficult to redirect. Pt has difficulty following demands appropriately. Bed alarm placed, lights dimmed to decrease stimuli. Will continue to monitor.

## 2021-01-13 NOTE — Assessment & Plan Note (Addendum)
-   Poor oral intake recently with recurrent falls - GOC was discussed with wife on admission.  Patient currently partial code (no CPR or intubation, would want medical management) - after discussion with oncology and wife, will change to DNR/DNI and pursue hospice placement

## 2021-01-13 NOTE — ED Notes (Addendum)
Attending made aware of fever and tachycardia, was advised to place ice packs to bring down pt temp and attending would be down as soon as available.  Ice packs placed and pt HR currently trending between 100-110. Will continue to monitor

## 2021-01-13 NOTE — Assessment & Plan Note (Addendum)
-   goal is comfort care now

## 2021-01-13 NOTE — Assessment & Plan Note (Addendum)
-   Follows up with oncology.  Dr. Lindi Adie aware patient in hospital - treated with Revlimid outpatient and Aranesp as needed - Hgb 7.2 g/dL on 12/21, given 1 unit PRBC; Hgb better this am - further conversations pending with oncology regarding next steps in treatment if any, depending on his clinical course currently; after discussion with oncology and wife on 12/22, decision made to transition to hospice

## 2021-01-13 NOTE — Progress Notes (Addendum)
ANTICOAGULATION Reversal  Pharmacy Consult for [employer] Indication: INR > 10, subdural hematoma, acute bleed  Allergies  Allergen Reactions   Atorvastatin    Oxycodone-Acetaminophen     REACTION: Hallucinations   Quetiapine     REACTION: disorientated   Rosuvastatin     REACTION: Reaction not known    Patient Measurements: TBW 77.3 kg  Vital Signs: Temp: 98.1 F (36.7 C) (12/20 1233) Temp Source: Oral (12/20 1233) BP: 115/48 (12/20 1500) Pulse Rate: 74 (12/20 1500)  Labs: Recent Labs    01/13/21 1023 01/13/21 1040 01/13/21 1239  HGB 7.7*  --   --   HCT 24.2*  --   --   PLT 77*  --   --   LABPROT  --  >90.0*  --   INR  --  >10.0*  --   CREATININE  --   --  0.94   Estimated Creatinine Clearance: 63.6 mL/min (by C-G formula based on SCr of 0.94 mg/dL).  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 Sent to ED 12/20 from [facility] with elevated INR > 10, Acute on chronic subdural hematoma, NeuroSurg consulted > rec anti-coag reversal, no surgery necessary. Vit K 10 mg IVPB x1 KCentra 2097 units x1 Monitor on step-down unit at [facility], not necessary to tx to [facility]  Goal of Therapy:  Monitor for s/s bleed Monitor platelets by anticoagulation protocol: Yes   Plan:  INR 60 minutes after end of K Centra > then daily x2  Nyoka Cowden, Felipe Cabell L PharmD 01/13/2021,3:27 PM  Addendum: 1700 INR 1.3, within treatment parameters, desire INR < 1.5 Continue to  monitor Daily Protime/INR x2 ordered  Minda Ditto PharmD [facility] [phone] 01/13/2021, 5:53 PM

## 2021-01-14 ENCOUNTER — Encounter (HOSPITAL_COMMUNITY): Payer: Self-pay | Admitting: Internal Medicine

## 2021-01-14 ENCOUNTER — Inpatient Hospital Stay (HOSPITAL_COMMUNITY): Payer: Medicare Other

## 2021-01-14 DIAGNOSIS — D46Z Other myelodysplastic syndromes: Secondary | ICD-10-CM

## 2021-01-14 LAB — CBC WITH DIFFERENTIAL/PLATELET
Abs Immature Granulocytes: 1.82 10*3/uL — ABNORMAL HIGH (ref 0.00–0.07)
Basophils Absolute: 0.2 10*3/uL — ABNORMAL HIGH (ref 0.0–0.1)
Basophils Relative: 1 %
Eosinophils Absolute: 0 10*3/uL (ref 0.0–0.5)
Eosinophils Relative: 0 %
HCT: 22 % — ABNORMAL LOW (ref 39.0–52.0)
Hemoglobin: 7.2 g/dL — ABNORMAL LOW (ref 13.0–17.0)
Immature Granulocytes: 12 %
Lymphocytes Relative: 10 %
Lymphs Abs: 1.6 10*3/uL (ref 0.7–4.0)
MCH: 32 pg (ref 26.0–34.0)
MCHC: 32.7 g/dL (ref 30.0–36.0)
MCV: 97.8 fL (ref 80.0–100.0)
Monocytes Absolute: 2.5 10*3/uL — ABNORMAL HIGH (ref 0.1–1.0)
Monocytes Relative: 16 %
Neutro Abs: 9.7 10*3/uL — ABNORMAL HIGH (ref 1.7–7.7)
Neutrophils Relative %: 61 %
Platelets: 69 10*3/uL — ABNORMAL LOW (ref 150–400)
RBC: 2.25 MIL/uL — ABNORMAL LOW (ref 4.22–5.81)
RDW: 26 % — ABNORMAL HIGH (ref 11.5–15.5)
WBC: 15.8 10*3/uL — ABNORMAL HIGH (ref 4.0–10.5)
nRBC: 3 % — ABNORMAL HIGH (ref 0.0–0.2)

## 2021-01-14 LAB — PROTIME-INR
INR: 1.4 — ABNORMAL HIGH (ref 0.8–1.2)
Prothrombin Time: 17.5 seconds — ABNORMAL HIGH (ref 11.4–15.2)

## 2021-01-14 LAB — COMPREHENSIVE METABOLIC PANEL
ALT: 27 U/L (ref 0–44)
AST: 34 U/L (ref 15–41)
Albumin: 2.8 g/dL — ABNORMAL LOW (ref 3.5–5.0)
Alkaline Phosphatase: 91 U/L (ref 38–126)
Anion gap: 12 (ref 5–15)
BUN: 28 mg/dL — ABNORMAL HIGH (ref 8–23)
CO2: 22 mmol/L (ref 22–32)
Calcium: 8.5 mg/dL — ABNORMAL LOW (ref 8.9–10.3)
Chloride: 103 mmol/L (ref 98–111)
Creatinine, Ser: 1.21 mg/dL (ref 0.61–1.24)
GFR, Estimated: 60 mL/min — ABNORMAL LOW (ref >60)
Glucose, Bld: 147 mg/dL — ABNORMAL HIGH (ref 70–99)
Potassium: 4 mmol/L (ref 3.5–5.1)
Sodium: 137 mmol/L (ref 135–145)
Total Bilirubin: 3 mg/dL — ABNORMAL HIGH (ref 0.3–1.2)
Total Protein: 6.5 g/dL (ref 6.5–8.1)

## 2021-01-14 LAB — HEMOGLOBIN A1C
Hgb A1c MFr Bld: 5.8 % — ABNORMAL HIGH (ref 4.8–5.6)
Mean Plasma Glucose: 119.76 mg/dL

## 2021-01-14 LAB — MAGNESIUM: Magnesium: 1.7 mg/dL (ref 1.7–2.4)

## 2021-01-14 LAB — PREPARE RBC (CROSSMATCH)

## 2021-01-14 LAB — T4, FREE: Free T4: 1.94 ng/dL — ABNORMAL HIGH (ref 0.61–1.12)

## 2021-01-14 LAB — TSH: TSH: 0.013 u[IU]/mL — ABNORMAL LOW (ref 0.350–4.500)

## 2021-01-14 MED ORDER — MORPHINE SULFATE (PF) 2 MG/ML IV SOLN
2.0000 mg | INTRAVENOUS | Status: DC | PRN
  Administered 2021-01-14 (×2): 2 mg via INTRAVENOUS
  Filled 2021-01-14 (×2): qty 1

## 2021-01-14 MED ORDER — ENSURE ENLIVE PO LIQD
237.0000 mL | Freq: Three times a day (TID) | ORAL | Status: DC
  Administered 2021-01-14: 21:00:00 237 mL via ORAL

## 2021-01-14 MED ORDER — SODIUM CHLORIDE 0.9% IV SOLUTION: Freq: Once | INTRAVENOUS | Status: AC

## 2021-01-14 MED ORDER — CHLORHEXIDINE GLUCONATE CLOTH 2 % EX PADS
6.0000 | MEDICATED_PAD | Freq: Every day | CUTANEOUS | Status: DC
  Administered 2021-01-14 – 2021-01-16 (×3): 6 via TOPICAL

## 2021-01-14 MED ORDER — LIDOCAINE 5 % EX PTCH
1.0000 | MEDICATED_PATCH | CUTANEOUS | Status: DC
  Administered 2021-01-14: 21:00:00 1 via TRANSDERMAL
  Filled 2021-01-14 (×4): qty 1

## 2021-01-14 MED ORDER — ADULT MULTIVITAMIN W/MINERALS CH
1.0000 | ORAL_TABLET | Freq: Every day | ORAL | Status: DC
  Filled 2021-01-14: qty 1

## 2021-01-14 MED ORDER — IBUPROFEN 100 MG/5ML PO SUSP
200.0000 mg | Freq: Once | ORAL | Status: AC
  Administered 2021-01-14: 03:00:00 200 mg via ORAL
  Filled 2021-01-14: qty 10

## 2021-01-14 NOTE — Plan of Care (Signed)
Initiated care plan 

## 2021-01-14 NOTE — Hospital Course (Addendum)
Jordan Clayton is a 80 y.o. male with PMH MDS, CAD, chronic afib, DMII, HTN, HLD who presented after being found to have a supratherapeutic INR at outpatient lab work.  He was also noted to be slightly confused and having increased lethargy/sleepiness and his wife had also reported a recent fall at home. Initial CT head performed showed a subdural hematoma involving the right cerebral hemisphere as well as findings to also show underlying chronic subdural hematoma. Case was discussed with neurosurgery and he was recommended to undergo ongoing monitoring and repeat CT head the morning following admission. Head CT was repeated on 01/14/2021 which showed no significant change or expansion of the underlying subdural hematoma.  No significant mass-effect or midline shift appreciated.  From his prior fall earlier in December, he has a T12 compression fracture and subtle fracture involving the S4 sacral body.  He is managed outpatient by hematology/oncology for his MDS and undergoes blood transfusions as needed and also Aranesp injections.  Due to worsening functional status and failure to thrive, GOC discussions were held and his wife elected to transition to comfort care with hospice. He was accepted to [facility] on discharge.  Further problem based plan below while managed in hospital.

## 2021-01-14 NOTE — Progress Notes (Signed)
HEMATOLOGY-ONCOLOGY PROGRESS NOTE  SUBJECTIVE: Patient fell at home and his INR was greater than 10 yesterday when he came to our clinic.  He was complaining of severe pain in his sacral area and was also significantly weaker and drowsy.  He was sent to the emergency room and from there he got admitted.  He is severely somnolent and disoriented.  Not able to answer any questions.  His wife is at his bedside.  He has not been eating much.  He has been given pain medication for the sacral pain.  Oncology History  MDS (myelodysplastic syndrome), high grade ([place])  12/09/2020 Initial Diagnosis   MDS (myelodysplastic syndrome), high grade with 5 q. minus   12/16/2020 Miscellaneous   Aranesp with Revlimid 2.5 mg daily 21 days on 7 days off     OBJECTIVE: REVIEW OF SYSTEMS:   As above  PHYSICAL EXAMINATION: ECOG PERFORMANCE STATUS: 4 - Bedbound  Vitals:   01/14/21 1209 01/14/21 1320  BP:  (!) 119/52  Pulse: 90 (!) 103  Resp:  (!) 25  Temp:    SpO2: 93% 90%   Filed Weights   01/13/21 1602 01/14/21 0500  Weight: 170 lb (77.1 kg) 167 lb 5.3 oz (75.9 kg)    LABORATORY DATA:  I have reviewed the data as listed CMP Latest Ref Rng & Units 01/14/2021 01/13/2021 12/31/2020  Glucose 70 - 99 mg/dL 147(H) 120(H) 121(H)  BUN 8 - 23 mg/dL 28(H) 23 32(H)  Creatinine 0.61 - 1.24 mg/dL 1.21 0.94 1.12  Sodium 135 - 145 mmol/L 137 135 141  Potassium 3.5 - 5.1 mmol/L 4.0 4.2 3.7  Chloride 98 - 111 mmol/L 103 101 106  CO2 22 - 32 mmol/L 22 25 23   Calcium 8.9 - 10.3 mg/dL 8.5(L) 8.5(L) 9.0  Total Protein 6.5 - 8.1 g/dL 6.5 7.1 6.7  Total Bilirubin 0.3 - 1.2 mg/dL 3.0(H) 1.8(H) 1.5(H)  Alkaline Phos 38 - 126 U/L 91 111 62  AST 15 - 41 U/L 34 27 17  ALT 0 - 44 U/L 27 28 14     Lab Results  Component Value Date   WBC 15.8 (H) 01/14/2021   HGB 7.2 (L) 01/14/2021   HCT 22.0 (L) 01/14/2021   MCV 97.8 01/14/2021   PLT 69 (L) 01/14/2021   NEUTROABS 9.7 (H) 01/14/2021    ASSESSMENT AND PLAN: 1.   Myelodysplastic syndrome with 5 q. minus: Currently on treatment with Aranesp and low-dose Revlimid.  Before the medicine had a chance to work he is currently experiencing elevated INR with a subdural hematoma and profound somnolence and disorientation. 2. pain in his sacral area: On pain medication which may also be contributing to his confusional state. 3.  We will hold off on giving any more Revlimid. 4.  Anemia: Agree with blood transfusion. Patient does not need any platelet transfusion at this time. We will follow.

## 2021-01-14 NOTE — Evaluation (Signed)
Occupational Therapy Evaluation Patient Details Name: Jordan Clayton MRN: [id number] DOB: Oct 14, 1940 Today's Date: 01/14/2021   History of Present Illness 80 yo male admitted with SDH after fall at home, increased INR, encephalopathy. Recent sacral fx (wife reports occurred ~1 week prior to this admission). Hx of MDS, Afib, DM, MI, anemia, back pain, delirium, CAD   Clinical Impression   [name] is an 80 year old man who presents with altered mental status, pain, generalized weakness and decreased activity tolerance, and impaired balance resulting in a sudden and rapid functional decline. Prior to fall patient independent with PRN use of cane. Currently patient needing max x 2 to transfer to edge of bed and total assist for ADLs. Patient will benefit from skilled OT services while in hospital to improve deficits and learn compensatory strategies as needed in order to return to PLOF.  Recommending short term rehab at discharge.     Recommendations for follow up therapy are one component of a multi-disciplinary discharge planning process, led by the attending physician.  Recommendations may be updated based on patient status, additional functional criteria and insurance authorization.   Follow Up Recommendations  Skilled nursing-short term rehab (<3 hours/day)    Assistance Recommended at Discharge Frequent or constant Supervision/Assistance  Functional Status Assessment  Patient has had a recent decline in their functional status and demonstrates the ability to make significant improvements in function in a reasonable and predictable amount of time.  Equipment Recommendations  Other (comment) (TBD)    Recommendations for Other Services       Precautions / Restrictions Precautions Precautions: Fall Precaution Comments: back pain from sacral fx; mittens Restrictions Weight Bearing Restrictions: No      Mobility Bed Mobility Overal bed mobility: Needs Assistance Bed  Mobility: Supine to Sit;Sit to Supine     Supine to sit: Max assist;+2 for safety/equipment;HOB elevated;+2 for physical assistance Sit to supine: Max assist;+2 for physical assistance;+2 for safety/equipment;HOB elevated   General bed mobility comments: Multimodal cueing required. Assist for trunk and bil LEs. Utilized bedpad for scooting, positioning at EOB. Once sitting EOB for a minute or two, pt able to sit with varying level of assistance Min guard-Min A.    Transfers                   General transfer comment: NT-unable to safely attempt on today      Balance Overall balance assessment: Needs assistance Sitting-balance support: Feet supported Sitting balance-Leahy Scale: Poor                                     ADL either performed or assessed with clinical judgement   ADL                                         General ADL Comments: Total assist currently due to altered mental status and need for safety mittens.     Vision   Vision Assessment?: No apparent visual deficits     Perception     Praxis      Pertinent Vitals/Pain Pain Assessment: Faces Faces Pain Scale: Hurts little more Pain Location: back Pain Descriptors / Indicators: Grimacing Pain Intervention(s): Limited activity within patient's tolerance;Heat applied;Repositioned     Hand Dominance     Extremity/Trunk Assessment Upper Extremity  Assessment Upper Extremity Assessment: Difficult to assess due to impaired cognition   Lower Extremity Assessment Lower Extremity Assessment: Defer to PT evaluation   Cervical / Trunk Assessment Cervical / Trunk Assessment: Kyphotic   Communication Communication Communication: Expressive difficulties;Receptive difficulties (at time of eval due to mental status/level of arousal/alertness)   Cognition Arousal/Alertness: Awake/alert Behavior During Therapy: Restless Overall Cognitive Status: Difficult to assess                                  General Comments: follows 1 step commands with inconsistently     General Comments       Exercises     Shoulder Instructions      Home Living Family/patient expects to be discharged to:: Unsure Living Arrangements: Spouse/significant other Available Help at Discharge: Family Type of Home: House                       Home Equipment: Conservation officer, nature (2 wheels);Cane - single point          Prior Functioning/Environment Prior Level of Function : Independent/Modified Independent             Mobility Comments: ambulatory with cane up until fall/sacral fx ADLs Comments: independent with ADLs up until fall        OT Problem List: Decreased strength;Decreased activity tolerance;Impaired balance (sitting and/or standing);Pain;Decreased knowledge of use of DME or AE;Decreased cognition;Decreased safety awareness      OT Treatment/Interventions: Self-care/ADL training;Therapeutic exercise;Cognitive remediation/compensation;DME and/or AE instruction;Therapeutic activities;Balance training;Patient/family education    OT Goals(Current goals can be found in the care plan section) Acute Rehab OT Goals Patient Stated Goal: go to rehab OT Goal Formulation: With family Time For Goal Achievement: 01/28/21 Potential to Achieve Goals: Good  OT Frequency: Min 2X/week   Barriers to D/C:            Co-evaluation              AM-PAC OT "6 Clicks" Daily Activity     Outcome Measure Help from another person eating meals?: Total Help from another person taking care of personal grooming?: Total Help from another person toileting, which includes using toliet, bedpan, or urinal?: Total Help from another person bathing (including washing, rinsing, drying)?: Total Help from another person to put on and taking off regular upper body clothing?: Total Help from another person to put on and taking off regular lower body clothing?: Total 6  Click Score: 6   End of Session Nurse Communication: Mobility status  Activity Tolerance: Treatment limited secondary to agitation Patient left: in bed;with call bell/phone within reach;with bed alarm set;with nursing/sitter in room  OT Visit Diagnosis: Other symptoms and signs involving cognitive function;Muscle weakness (generalized) (M62.81);Pain;History of falling (Z91.81)                Time: 1157-2620 OT Time Calculation (min): 14 min Charges:  OT General Charges $OT Visit: 1 Visit OT Evaluation $OT Eval Low Complexity: 1 Low  Shaquel Josephson, OTR/L [place]  Office [phone] Pager: [place] 01/14/2021, 4:42 PM

## 2021-01-14 NOTE — Progress Notes (Signed)
Initial Nutrition Assessment  DOCUMENTATION CODES:   Not applicable  INTERVENTION:   Ensure Enlive po TID, each supplement provides 350 kcal and 20 grams of protein  Magic cup TID with meals, each supplement provides 290 kcal and 9 grams of protein  MVI po daily   Liberalize diet   Pt at high refeed risk; recommend monitor potassium, magnesium and phosphorus labs daily until stable  NUTRITION DIAGNOSIS:   Inadequate oral intake related to chronic illness (MDS) as evidenced by per patient/family report.  GOAL:   Patient will meet greater than or equal to 90% of their needs  MONITOR:   PO intake, Supplement acceptance, Labs, Weight trends, Skin, I & O's  REASON FOR ASSESSMENT:   Malnutrition Screening Tool    ASSESSMENT:   80 y.o. male with medical history significant of myelodysplastic syndrome, CAD, chronic A. fib on anticoagulation, type II DM, HTN, HLD, MI, dementia, anemia and thrombocytopenia who is admitted with SDH and fall with sacral and T12 compression fractures.  RD working remotely.  Unable to reach pt by phone as pt noted to be somnolent and confusion with dementia. Per chart review, pt with poor appetite and oral intake at baseline. Pt is refusing meals in hospital. Of note, pt is edentulous and does not wear his dentures. Per chart, pt is down 35lbs(18%) over the past 11 months; this is significant weight loss. RD will add supplements and MVI to help pt meet his estimated needs. Pt is at high refeed risk. Palliative care consult is pending. RD will monitor for GOC vs the need for nutrition support. RD will obtain nutrition related history and exam at follow-up.   Medications reviewed and include: protonix, NaCl w/ 5% dextrose @75ml /hr  Labs reviewed: BUN 28(H) Wbc- 15.8(H), Hgb 7.2(L), Hct 22.0(L)  NUTRITION - FOCUSED PHYSICAL EXAM: Unable to perform at this time   Diet Order:   Diet Order             Diet heart healthy/carb modified Room service  appropriate? Yes; Fluid consistency: Thin  Diet effective now                  EDUCATION NEEDS:   No education needs have been identified at this time  Skin:  Skin Assessment: Reviewed RN Assessment (ecchymosis)  Last BM:  pta  Height:   Ht Readings from Last 1 Encounters:  01/13/21 5' 10.5" (1.791 m)    Weight:   Wt Readings from Last 1 Encounters:  01/14/21 75.9 kg    Ideal Body Weight:  76.8 kg  BMI:  Body mass index is 23.67 kg/m.  Estimated Nutritional Needs:   Kcal:  1900-2200kcal/day  Protein:  95-110g/day  Fluid:  1.9-2.2L/day  Koleen Distance MS, RD, LDN Please refer to [facility] for RD and/or RD on-call/weekend/after hours pager

## 2021-01-14 NOTE — Progress Notes (Addendum)
PT Cancellation Note  Patient Details Name: Jordan Clayton MRN: [id number] DOB: Nov 18, 1940   Cancelled Treatment:    Reason Eval/Treat Not Completed: Medical issues which prohibited therapy --currently on bedrest and awaiting CT. Also HR is elevated. Will hold until mobility status is updated by MD.    Doreatha Massed, PT Acute Rehabilitation  Office: [phone] Pager: [phone]

## 2021-01-14 NOTE — Evaluation (Signed)
Physical Therapy Evaluation Patient Details Name: Jordan Clayton MRN: [id number] DOB: [birth year] Today's Date: 01/14/2021  History of Present Illness  80 yo male admitted with SDH after fall at home, increased INR, encephalopathy. Recent sacral fx (wife reports occurred ~1 week prior to this admission). Hx of MDS, Afib, DM, MI, anemia, back pain, delirium, CAD  Clinical Impression  On eval, pt required Max assist +2 for bed mobility. He sat EOB for at least 8 minutes with Min A-Min guard assist after a couple minutes. Multimodal, repeated cueing required for participation. Pt tended to keep his eyes closed. Unable to safely attempt standing on today so assisted pt back to bed. Wife was present during session. Sitter back in room at end of session. Will plan to follow and progress activity as tolerated. PT recommendation is for SNF rehab.      Recommendations for follow up therapy are one component of a multi-disciplinary discharge planning process, led by the attending physician.  Recommendations may be updated based on patient status, additional functional criteria and insurance authorization.  Follow Up Recommendations Skilled nursing-short term rehab (<3 hours/day)    Assistance Recommended at Discharge Frequent or constant Supervision/Assistance  Functional Status Assessment Patient has had a recent decline in their functional status and demonstrates the ability to make significant improvements in function in a reasonable and predictable amount of time.  Equipment Recommendations  None recommended by PT    Recommendations for Other Services OT consult     Precautions / Restrictions Precautions Precautions: Fall Precaution Comments: back pain from sacral fx; mittens Restrictions Weight Bearing Restrictions: No      Mobility  Bed Mobility Overal bed mobility: Needs Assistance Bed Mobility: Supine to Sit;Sit to Supine     Supine to sit: Max assist;+2 for safety/equipment;HOB  elevated;+2 for physical assistance Sit to supine: Max assist;+2 for physical assistance;+2 for safety/equipment;HOB elevated   General bed mobility comments: Multimodal cueing required. Assist for trunk and bil LEs. Utilized bedpad for scooting, positioning at EOB. Once sitting EOB for a minute or two, pt able to sit with varying level of assistance Min guard-Min A.    Transfers                   General transfer comment: NT-unable to safely attempt on today    Ambulation/Gait                  Stairs            Wheelchair Mobility    Modified Rankin (Stroke Patients Only)       Balance Overall balance assessment: Needs assistance;History of Falls Sitting-balance support: Feet supported Sitting balance-Leahy Scale: Poor                                       Pertinent Vitals/Pain Pain Assessment: Faces Faces Pain Scale: Hurts little more Pain Location: back Pain Intervention(s): Heat applied;Monitored during session;Repositioned    Home Living Family/patient expects to be discharged to:: Unsure Living Arrangements: Spouse/significant other Available Help at Discharge: Family Type of Home: House           Home Equipment: Conservation officer, nature (2 wheels);Cane - single point      Prior Function               Mobility Comments: ambulatory with cane up until fall/sacral fx       Hand  Dominance        Extremity/Trunk Assessment   Upper Extremity Assessment Upper Extremity Assessment: Defer to OT evaluation    Lower Extremity Assessment Lower Extremity Assessment: Difficult to assess due to impaired cognition    Cervical / Trunk Assessment Cervical / Trunk Assessment: Kyphotic  Communication   Communication: Expressive difficulties;Receptive difficulties (at time of eval due to mental status/level of arousal/alertness)  Cognition Arousal/Alertness:  (tends to keep eyes closed) Behavior During Therapy:  Restless Overall Cognitive Status: Difficult to assess                                 General Comments: follows 1 step commands with inconsistently        General Comments      Exercises     Assessment/Plan    PT Assessment Patient needs continued PT services  PT Problem List Decreased strength;Decreased mobility;Decreased activity tolerance;Decreased balance;Pain;Decreased cognition;Decreased knowledge of use of DME;Decreased safety awareness       PT Treatment Interventions DME instruction;Gait training;Therapeutic activities;Therapeutic exercise;Patient/family education;Balance training;Functional mobility training    PT Goals (Current goals can be found in the Care Plan section)  Acute Rehab PT Goals Patient Stated Goal: pt unable to state. wife wants whatever will get pt better PT Goal Formulation: With family Time For Goal Achievement: 01/28/21 Potential to Achieve Goals: Good    Frequency Min 2X/week   Barriers to discharge        Co-evaluation               AM-PAC PT "6 Clicks" Mobility  Outcome Measure Help needed turning from your back to your side while in a flat bed without using bedrails?: Total Help needed moving from lying on your back to sitting on the side of a flat bed without using bedrails?: Total Help needed moving to and from a bed to a chair (including a wheelchair)?: Total Help needed standing up from a chair using your arms (e.g., wheelchair or bedside chair)?: Total Help needed to walk in hospital room?: Total Help needed climbing 3-5 steps with a railing? : Total 6 Click Score: 6    End of Session   Activity Tolerance: Patient tolerated treatment well Patient left: in bed;with call bell/phone within reach   PT Visit Diagnosis: Muscle weakness (generalized) (M62.81);History of falling (Z91.81);Difficulty in walking, not elsewhere classified (R26.2);Pain Pain - part of body:  (back/sacrum)    Time: 6599-3570 PT  Time Calculation (min) (ACUTE ONLY): 15 min   Charges:   PT Evaluation $PT Eval Moderate Complexity: 1 Mod            Doreatha Massed, PT Acute Rehabilitation  Office: [phone] Pager: [phone]

## 2021-01-14 NOTE — ED Notes (Signed)
Pt is restless. Yelling out "help". States he "needs to pee". Pt placed on a male purewick and is dry. Pt attempting to get out of bed. Requires multiple verbal cues for redirection. Pt unable to follow through with commands.   Bed alarm activated.

## 2021-01-14 NOTE — Progress Notes (Signed)
Patient passed stroke swallow screen and drank a full cup of water. He then stated that he needed to pee. Patient unable to urinate with primafit or urinal. Bladder scanned patient - 281 ml shown. Notified NP. Will continue to  monitor.

## 2021-01-14 NOTE — ED Notes (Signed)
Patient's temp still 102.3 after tylenol given at 2252 and ice packs placed. Notified J. Olena Heckle, NP. Will continue to monitor

## 2021-01-14 NOTE — Progress Notes (Addendum)
Progress Note    Jordan Clayton   XFG:[id number]  DOB: 16-Feb-1940  DOA: 01/13/2021     1 PCP: Jordan Downing, MD  Initial CC: elevated INR, fall at home   Hospital Course: Jordan Clayton is a 80 y.o. male with PMH MDS, CAD, chronic afib, DMII, HTN, HLD who presented after being found to have a supratherapeutic INR at outpatient lab work.  He was also noted to be slightly confused and having increased lethargy/sleepiness and his wife had also reported a recent fall at home. Initial CT head performed showed a subdural hematoma involving the right cerebral hemisphere as well as findings to also show underlying chronic subdural hematoma. Case was discussed with neurosurgery and he was recommended to undergo ongoing monitoring and repeat CT head the morning following admission. Head CT was repeated on 01/14/2021 which showed no significant change or expansion of the underlying subdural hematoma.  No significant mass-effect or midline shift appreciated.  From his prior fall earlier in December, he has a T12 compression fracture and subtle fracture involving the S4 sacral body.  He is managed outpatient by hematology/oncology for his MDS and undergoes blood transfusions as needed and also Aranesp injections.  Interval History:  Wife present bedside this morning.  Noted to have mittens in place with sitter bedside.  He was difficult to redirect and very impulsive.  Assessment & Plan: * SDH (subdural hematoma) - noted on 12/20 CTH; repeat CTH on 12/21 shows stable SDH - d/c bedrest - okay to work with PT/OT -In general this speaks to his ongoing failure to thrive with his recurrent falls and underlying compression fractures -Discussed with wife at bedside as well  MDS (myelodysplastic syndrome), high grade ([place]) - Follows up with oncology.  Dr. Lindi Adie aware patient in hospital - treated with Revlimid outpatient and Aranesp as needed - Hgb 7.2 g/dL this am; will transfuse 1 unit  PRBC  Mild cognitive impairment - See delirium as well  Delirium - continue delirium precautions - moreso at risk for encephalopathy from SDH, poor intake, underlying MDS treatment and overall failure to thrive recently   Atrial fibrillation, chronic (HCC) - On Coumadin chronically; wife reports poor appetite lately and patient has not been eating; this may be contributing to his supratherapeutic level noted prior to admission -INR has been reversed on admission; down to 1.4 this morning - Continue holding Coumadin - Will need to discuss possible transition to alternative anticoagulation with his wife prior to discharge  Failure to thrive in adult Wife reports that ever since patient has been started on the Revlimid he has poor p.o. intake.  Which has worsened ever since he had a fall. Prognosis poor.   Goals of care, counseling/discussion - Poor oral intake recently with recurrent falls - GOC was discussed with wife on admission.  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  If elevates, will treat  Controlled type 2 diabetes mellitus without complication, without long-term current use of insulin (HCC) - Check A1c - continue SSI and CBG monitoring   Osteopenia - continue PT/OT  Leucocytosis Likely stress reaction.  Monitor.  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  Currently no indication for anticoagulation or antiplatelet therapy    Old records reviewed in assessment of this patient  Antimicrobials:   DVT prophylaxis: SCD  Code Status:   Code Status: Partial Code  Disposition Plan:   Status is: Inpatient  Objective: Blood pressure (!) 127/56, pulse (!) 104, temperature (!) 97.5 F (36.4 C), temperature source Oral, resp. rate (!) 24, height 5' 10.5" (1.791 m), weight 75.9 kg, SpO2 94 %.  Examination:  Physical Exam Constitutional:      Comments: Chronically ill-appearing elderly gentleman lying in bed in no distress but impulsive and difficult to redirect  HENT:     Head: Normocephalic and atraumatic.     Mouth/Throat:     Mouth: Mucous membranes are moist.  Eyes:     Extraocular Movements: Extraocular movements intact.     Pupils: Pupils are equal, round, and reactive to light.  Cardiovascular:     Rate and Rhythm: Normal rate and regular rhythm.     Heart sounds: Normal heart sounds.  Pulmonary:     Effort: Pulmonary effort is normal. No respiratory distress.     Breath sounds: Normal breath sounds. No wheezing.  Abdominal:     General: Bowel sounds are normal. There is no distension.     Palpations: Abdomen is soft.     Tenderness: There is no abdominal tenderness.  Musculoskeletal:        General: Normal range of motion.     Cervical back: Normal range of motion and neck supple.  Skin:    General: Skin is warm and dry.  Neurological:     Mental Status: He is disoriented.     Comments: Moving all 4 extremities spontaneously.  Follows some commands.  Psychiatric:        Mood and Affect: Mood normal.        Behavior: Behavior normal.     Consultants:    Procedures:    Data Reviewed: I have personally reviewed labs and imaging studies    LOS: 1 day  Time spent: Greater than 50% of the 35 minute visit was spent in counseling/coordination of care for the patient as laid out in the A&P.   Dwyane Dee, MD [facility] 01/14/2021, 6:11 PM

## 2021-01-14 NOTE — Evaluation (Signed)
SLP Cancellation Note  Patient Details Name: TAGG EUSTICE MRN: [id number] DOB: [birth year]   Cancelled treatment:       Reason Eval/Treat Not Completed: Other (comment) (pt currenlty lethargic, has safety sitter in room, spouse reports he was doing well prior to admit - cognitive wise, no language, speech or cognitive deficits PTA, minimal cognitive deficit possible due to pt not using his dentures, will continue efforts)  Kathleen Lime, MS [place] Office [phone] Pager [phone]  Macario Golds 01/14/2021, 2:17 PM

## 2021-01-15 ENCOUNTER — Inpatient Hospital Stay (HOSPITAL_COMMUNITY): Payer: Medicare Other

## 2021-01-15 DIAGNOSIS — R627 Adult failure to thrive: Secondary | ICD-10-CM

## 2021-01-15 DIAGNOSIS — D46Z Other myelodysplastic syndromes: Secondary | ICD-10-CM | POA: Diagnosis not present

## 2021-01-15 DIAGNOSIS — E43 Unspecified severe protein-calorie malnutrition: Secondary | ICD-10-CM | POA: Insufficient documentation

## 2021-01-15 DIAGNOSIS — Z7189 Other specified counseling: Secondary | ICD-10-CM | POA: Diagnosis not present

## 2021-01-15 LAB — PROTIME-INR
INR: 1.5 — ABNORMAL HIGH (ref 0.8–1.2)
Prothrombin Time: 18.4 seconds — ABNORMAL HIGH (ref 11.4–15.2)

## 2021-01-15 LAB — CBC WITH DIFFERENTIAL/PLATELET
Abs Immature Granulocytes: 2.32 10*3/uL — ABNORMAL HIGH (ref 0.00–0.07)
Basophils Absolute: 0.3 10*3/uL — ABNORMAL HIGH (ref 0.0–0.1)
Basophils Relative: 1 %
Eosinophils Absolute: 0 10*3/uL (ref 0.0–0.5)
Eosinophils Relative: 0 %
HCT: 25.9 % — ABNORMAL LOW (ref 39.0–52.0)
Hemoglobin: 8.5 g/dL — ABNORMAL LOW (ref 13.0–17.0)
Immature Granulocytes: 11 %
Lymphocytes Relative: 22 %
Lymphs Abs: 4.6 10*3/uL — ABNORMAL HIGH (ref 0.7–4.0)
MCH: 31 pg (ref 26.0–34.0)
MCHC: 32.8 g/dL (ref 30.0–36.0)
MCV: 94.5 fL (ref 80.0–100.0)
Monocytes Absolute: 0.5 10*3/uL (ref 0.1–1.0)
Monocytes Relative: 2 %
Neutro Abs: 13.6 10*3/uL — ABNORMAL HIGH (ref 1.7–7.7)
Neutrophils Relative %: 64 %
Platelets: 74 10*3/uL — ABNORMAL LOW (ref 150–400)
RBC: 2.74 MIL/uL — ABNORMAL LOW (ref 4.22–5.81)
RDW: 24.3 % — ABNORMAL HIGH (ref 11.5–15.5)
WBC: 21.3 10*3/uL — ABNORMAL HIGH (ref 4.0–10.5)
nRBC: 3.9 % — ABNORMAL HIGH (ref 0.0–0.2)

## 2021-01-15 LAB — COMPREHENSIVE METABOLIC PANEL
ALT: 31 U/L (ref 0–44)
AST: 43 U/L — ABNORMAL HIGH (ref 15–41)
Albumin: 2.9 g/dL — ABNORMAL LOW (ref 3.5–5.0)
Alkaline Phosphatase: 82 U/L (ref 38–126)
Anion gap: 10 (ref 5–15)
BUN: 31 mg/dL — ABNORMAL HIGH (ref 8–23)
CO2: 22 mmol/L (ref 22–32)
Calcium: 8.2 mg/dL — ABNORMAL LOW (ref 8.9–10.3)
Chloride: 107 mmol/L (ref 98–111)
Creatinine, Ser: 1.07 mg/dL (ref 0.61–1.24)
GFR, Estimated: >60 mL/min (ref >60)
Glucose, Bld: 200 mg/dL — ABNORMAL HIGH (ref 70–99)
Potassium: 3.7 mmol/L (ref 3.5–5.1)
Sodium: 139 mmol/L (ref 135–145)
Total Bilirubin: 4.4 mg/dL — ABNORMAL HIGH (ref 0.3–1.2)
Total Protein: 6.6 g/dL (ref 6.5–8.1)

## 2021-01-15 LAB — MAGNESIUM: Magnesium: 1.9 mg/dL (ref 1.7–2.4)

## 2021-01-15 MED ORDER — LORAZEPAM 2 MG/ML PO CONC: 1.0000 mg | ORAL | Status: DC | PRN

## 2021-01-15 MED ORDER — LORAZEPAM 1 MG PO TABS: 1.0000 mg | ORAL_TABLET | ORAL | Status: DC | PRN

## 2021-01-15 MED ORDER — HALOPERIDOL 0.5 MG PO TABS
0.5000 mg | ORAL_TABLET | ORAL | Status: DC | PRN
  Filled 2021-01-15: qty 1

## 2021-01-15 MED ORDER — HALOPERIDOL LACTATE 2 MG/ML PO CONC
0.5000 mg | ORAL | Status: DC | PRN
  Filled 2021-01-15: qty 0.3

## 2021-01-15 MED ORDER — SODIUM CHLORIDE 0.9 % IV SOLN
3.0000 g | Freq: Four times a day (QID) | INTRAVENOUS | Status: DC
  Filled 2021-01-15: qty 8

## 2021-01-15 MED ORDER — GLYCOPYRROLATE 0.2 MG/ML IJ SOLN: 0.2000 mg | INTRAMUSCULAR | Status: DC | PRN

## 2021-01-15 MED ORDER — LORAZEPAM 2 MG/ML IJ SOLN: 1.0000 mg | INTRAMUSCULAR | Status: DC | PRN

## 2021-01-15 MED ORDER — HALOPERIDOL LACTATE 5 MG/ML IJ SOLN
0.5000 mg | INTRAMUSCULAR | Status: DC | PRN
  Administered 2021-01-16: 0.5 mg via INTRAVENOUS

## 2021-01-15 MED ORDER — ADULT MULTIVITAMIN W/MINERALS CH
1.0000 | ORAL_TABLET | Freq: Every day | ORAL | Status: DC
Start: 2021-01-16 — End: 2021-01-15

## 2021-01-15 MED ORDER — GLYCOPYRROLATE 1 MG PO TABS
1.0000 mg | ORAL_TABLET | ORAL | Status: DC | PRN
  Filled 2021-01-15: qty 1

## 2021-01-15 MED ORDER — MORPHINE SULFATE (PF) 2 MG/ML IV SOLN
1.0000 mg | INTRAVENOUS | Status: DC | PRN
  Administered 2021-01-15: 23:00:00 1 mg via INTRAVENOUS
  Filled 2021-01-15: qty 1

## 2021-01-15 MED ORDER — OSMOLITE 1.5 CAL PO LIQD
1000.0000 mL | ORAL | Status: DC
  Filled 2021-01-15: qty 1000

## 2021-01-15 MED ORDER — PROSOURCE TF PO LIQD
45.0000 mL | Freq: Two times a day (BID) | ORAL | Status: DC
  Filled 2021-01-15: qty 45

## 2021-01-15 NOTE — Consult Note (Signed)
Consultation Note Date: 01/15/2021   Patient Name: Jordan Clayton  DOB: [birth year]  MRN: [id number]  Age / Sex: 80 y.o., male  PCP: Leonard Downing, MD Referring Physician: Dwyane Dee, MD  Reason for Consultation: Establishing goals of care  HPI/Patient Profile: 80 y.o. male  with past medical history of myelodysplastic syndrome, CAD, chronic afib, DMII, HTN, HLD who presented after being found to have a supratherapeutic INR at outpatient lab work and sent to the ED on 01/13/2021 where he was found to have a subdural hematoma involving the right cerebral hemisphere as well as findings to also show underlying chronic subdural hematoma.   Patient was previous seen in the ED on 01/05/21 after a fall and found to have a T12 compression fracture. He was discharged home on Oxycodone and per wife, he has been more confused and drowsy at home. Patient continues to be confused and agitated during this hospitalization. Sitter and restraints in place. INR has now been reversed.   Palliative care consulted for goals of care  Clinical Assessment and Goals of Care: Patient was evaluated in the room getting blood draw with sitter present. Alert but not oriented. Patient not eating per RN, SLP notes reviewed. SDH likely contributing to patient's mental status. Repeat imaging does not show any progression of hematoma or resolution. Coumadin on hold for now.  Palliative medicine is specialized medical care for people living with serious illness. It focuses on providing relief from the symptoms and stress of a serious illness. The goal is to improve quality of life for both the patient and the family. Goals of care: Broad aims of medical therapy in relation to the patient's values and preferences. Our aim is to provide medical care aimed at enabling patients to achieve the goals that matter most to them, given the  circumstances of their particular medical situation and their constraints.   NEXT OF KIN  Wife: Discussed with wife about scope of current hospitalization as well as patient's current condition and underlying conditions.  She is thankful for information being provided to her by hospital medicine doctors as well as the entire staff.  SUMMARY OF RECOMMENDATIONS   Agree with temporary feeding tube as a time-limited trial, continue current interventions as well. Continue Partial Code for now Monitor SDH continue delirium precautions  Code Status/Advance Care Planning: Limited code: No CPR/Ventilator   Symptom Management:  Continue current management  Palliative Prophylaxis:  Bowel Regimen    Psycho-social/Spiritual:  Desire for further Chaplaincy support:yes Additional Recommendations: Caregiving  Support/Resources  Prognosis:  Unable to determine  Discharge Planning: To Be Determined      Primary Diagnoses: Present on Admission:  SDH (subdural hematoma)  Atrial fibrillation, chronic (HCC)  Coronary artery disease  Delirium  Essential hypertension  MDS (myelodysplastic syndrome), high grade (HCC)  Mild cognitive impairment  Chronic diastolic CHF (congestive heart failure) (HCC)  T12 compression fracture (HCC)  Osteopenia  Sacral fracture (HCC)  Anemia in other chronic diseases classified elsewhere  Thrombocytopenia (HCC)  Leucocytosis  Failure  to thrive in adult   I have reviewed the medical record, interviewed the patient and family, and examined the patient. The following aspects are pertinent.  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   CAD (coronary artery disease)    Chronic atrial fibrillation (HCC)    Delirium    DM2 (diabetes mellitus, type 2) (HCC)    Drug therapy    coumadin   HTN (hypertension)    Hypernatremia    MDS (myelodysplastic syndrome), high grade ([place]) 12/09/2020   Mixed hyperlipidemia    Septic  shock(785.52)    Social History   Socioeconomic History   Marital status: Married    Spouse name: Not on file   Number of children: Not on file   Years of education: Not on file   Highest education level: Not on file  Occupational History   Not on file  Tobacco Use   Smoking status: Never   Smokeless tobacco: Current    Types: Chew  Substance and Sexual Activity   Alcohol use: No    Alcohol/week: 0.0 standard drinks   Drug use: No   Sexual activity: Not Currently  Other Topics Concern   Not on file  Social History Narrative   Married; retired; disabled, does not get regular exercise.    Social Determinants of Health   Financial Resource Strain: Not on file  Food Insecurity: Not on file  Transportation Needs: Not on file  Physical Activity: Not on file  Stress: Not on file  Social Connections: Not on file   Family History  Problem Relation Age of Onset   Coronary artery disease Other        family hx   Hypertension Other        family hx   Scheduled Meds:   stroke: mapping our early stages of recovery book   Does not apply Once   Chlorhexidine Gluconate Cloth  6 each Topical Daily   lidocaine  1 patch Transdermal Q24H   [START ON 01/16/2021] multivitamin with minerals  1 tablet Per Tube Daily   pantoprazole (PROTONIX) IV  40 mg Intravenous QHS   Continuous Infusions:  dextrose 5 % and 0.9% NaCl 75 mL/hr at 01/15/21 0536   PRN Meds:.acetaminophen **OR** acetaminophen (TYLENOL) oral liquid 160 mg/5 mL **OR** acetaminophen, haloperidol lactate Medications Prior to Admission:  Prior to Admission medications   Medication Sig Start Date End Date Taking? Authorizing Provider  amLODipine (NORVASC) 5 MG tablet Take 1 tablet (5 mg total) by mouth daily. SCHEDULE OFFICE VISIT FOR FUTURE REFILL Patient taking differently: Take 5 mg by mouth daily. 01/01/21  Yes Lelon Perla, MD  Coenzyme Q10 (COQ10 PO) Take 100 mg by mouth daily.   Yes [provider]  folic  acid (FOLVITE) 283 MCG tablet Take 400 mcg by mouth daily.   Yes [provider]  furosemide (LASIX) 40 MG tablet Take 1 tablet (40 mg total) by mouth daily. Daily as needed for swelling Patient taking differently: Take 40 mg by mouth daily as needed for fluid. 07/23/16  Yes Patrecia Pour, Christean Grief, MD  HYDROcodone-acetaminophen (NORCO/VICODIN) 5-325 MG tablet Take 1 tablet by mouth every 6 (six) hours as needed for severe pain. 01/01/20  Yes [provider]  lenalidomide (REVLIMID) 2.5 MG capsule Take 1 capsule (2.5 mg total) by mouth daily. 1 capsule for 21 days on and 7 days off. Repeat every 28 days. Patient taking differently: Take 2.5 mg by  mouth See admin instructions. 1 capsule for 21 days on and 7 days off. Repeat every 28 days. 12/16/20  Yes Nicholas Lose, MD  metFORMIN (GLUCOPHAGE) 1000 MG tablet Take 1,000 mg by mouth 2 (two) times daily.   Yes [provider]  metoprolol tartrate (LOPRESSOR) 25 MG tablet Take 1 tablet by mouth twice daily Patient taking differently: Take 25 mg by mouth 2 (two) times daily. 05/13/20  Yes Lelon Perla, MD  Multiple Vitamin (MULTIVITAMIN) tablet Take 1 tablet by mouth daily.   Yes [provider]  Multiple Vitamins-Minerals (PRESERVISION AREDS 2 PO) Take 1 tablet by mouth daily.   Yes [provider]  nitroGLYCERIN (NITROSTAT) 0.4 MG SL tablet Place 1 tablet (0.4 mg total) under the tongue every 5 (five) minutes as needed (up to 3 tablets). Patient taking differently: Place 0.4 mg under the tongue every 5 (five) minutes as needed for chest pain. 11/13/19  Yes Lelon Perla, MD  pravastatin (PRAVACHOL) 40 MG tablet Take 40 mg by mouth at bedtime.    Yes [provider]  quinapril (ACCUPRIL) 40 MG tablet Take 1 tablet by mouth once daily with breakfast Patient taking differently: Take 40 mg by mouth daily. 04/17/20  Yes Lelon Perla, MD  Thiamine HCl (VITAMIN B-1) 100 MG tablet Take 100 mg by mouth  daily.   Yes [provider]  vitamin B-12 (CYANOCOBALAMIN) 250 MCG tablet Take 250 mcg by mouth daily.   Yes [provider]  warfarin (COUMADIN) 5 MG tablet TAKE 1 TABLET BY MOUTH ONCE DAILY OR AS DIRECTED BY COUMADIN CLINIC Patient taking differently: Take 5 mg by mouth See admin instructions. Takes 5 mg by mouth on Sunday, Monday, Wednesday and Friday. 10/09/20  Yes Lelon Perla, MD  warfarin (COUMADIN) 7.5 MG tablet TAKE AS DIRECTED BY COUMADIN CLINIC Patient taking differently: Take 7.5 mg by mouth See admin instructions. Takes 7.5 mg by mouth on Tuesday, Thursday and Saturday 12/22/20  Yes Crenshaw, Denice Bors, MD  HYDROcodone-acetaminophen (NORCO/VICODIN) 5-325 MG tablet Take 1 tablet by mouth every 6 (six) hours as needed for moderate pain. Patient not taking: Reported on 01/13/2021 01/05/21   Fredia Sorrow, MD   Allergies  Allergen Reactions   Atorvastatin Other (See Comments)    unknown   Oxycodone-Acetaminophen     REACTION: Hallucinations   Quetiapine     REACTION: disorientated   Rosuvastatin Other (See Comments)     unknown   Review of Systems +confused  Physical Exam Elderly gentleman resting in bed, not agitated at present, verbalizes some to safety sitter at bedside and follows her commands, not noted to be in distress Moves all 4 extremities spontaneously Does not have edema Appears to have regular work of breathing S 1 S 2   Vital Signs: BP 130/82    Pulse 91    Temp 98.2 F (36.8 C) (Oral)    Resp 17    Ht 5' 10.5" (1.791 m)    Wt 75.9 kg    SpO2 93%    BMI 23.67 kg/m  Pain Scale: Faces   Pain Score: 0-No pain   SpO2: SpO2: 93 % O2 Device:SpO2: 93 % O2 Flow Rate: .O2 Flow Rate (L/min): 3 L/min  IO: Intake/output summary:  Intake/Output Summary (Last 24 hours) at 01/15/2021 1324 Last data filed at 01/15/2021 0900 Gross per 24 hour  Intake 2604.56 ml  Output 900 ml  Net 1704.56 ml    LBM: Last BM Date:  (PTA) Baseline  Weight: Weight: 77.1 kg Most recent weight: Weight: 75.9 kg     Palliative Assessment/Data:   PPS 40%  Time In:  11 Time Out:  12 Time Total:  60  Greater than 50%  of this time was spent counseling and coordinating care related to the above assessment and plan.  Signed by: Loistine Chance, MD   Please contact Palliative Medicine Team phone at [phone] for questions and concerns.  For individual provider: See Shea Evans

## 2021-01-15 NOTE — Evaluation (Signed)
SLP Cancellation Note  Patient Details Name: Jordan Clayton MRN: [id number] DOB: [birth year]   Cancelled treatment:       Reason Eval/Treat Not Completed: Other (comment) (per RN,  Pt "minimally responds verbally, very confused this am" will continue efforts) Kathleen Lime, MS [place] Office [phone] Pager [phone]   Macario Golds 01/15/2021, 9:03 AM

## 2021-01-15 NOTE — Progress Notes (Signed)
Placed small bore feeding tube per MD Girguis order. Abdominal portable X-ray placed to verify placement. Co2 detector did not have any color change, feeding tube auscultated and could be heard upon auscultation.

## 2021-01-15 NOTE — Plan of Care (Signed)
Discussed with patient plan of care for the evening, pain management and blood transfusion needed with understanding of pain only at this time.  Problem: Education: Goal: Knowledge of General Education information will improve Description: Including pain rating scale, medication(s)/side effects and non-pharmacologic comfort measures Outcome: Progressing   Problem: Health Behavior/Discharge Planning: Goal: Ability to manage health-related needs will improve Outcome: Progressing

## 2021-01-15 NOTE — Progress Notes (Signed)
Safety sitter

## 2021-01-15 NOTE — Assessment & Plan Note (Addendum)
-  Patients BMI is Body mass index is 23.67 kg/m.. - Patient has the following signs/symptoms consistent with PCM: (fat loss, muscle loss, muscle wasting). - seen by RD, appreciate assistance. Continue plan per RD as well - after talking with patient's wife he has had prolonged poor intake almost 1 week now and no signs of fast improvement; due to this and her wanting to remain hopeful for improvement, we will give trial of TF at this time; if he improves enough for resuming eating independently then that's the hope/goal and if not, we will need further [place] discussions. Palliative care also following and discussing with wife, greatly appreciate help  -Shortly after this discussion, Dr. Lindi Adie met with patient's wife bedside and she elected for not pursuing further aggressive medical measures and instead transitioning to hospice/comfort care

## 2021-01-15 NOTE — Progress Notes (Signed)
Nutrition Follow-up  DOCUMENTATION CODES:   Severe malnutrition in context of acute illness/injury  INTERVENTION:   Monitor magnesium, potassium, and phosphorus BID for at least 3 days, MD to replete as needed, as pt is at risk for refeeding syndrome.  -Initiate Osmolite 1.5 @ 20 ml/hr via small bore NGT. Advance by 10 ml every 12 hours to goal rate of 60 ml/hr. -83ml Prosource TF BID -Provides at goal: 2240 kcals, 112g protein and 1097 ml H2O  -Multivitamin with minerals daily via tube  NEW NUTRITION DIAGNOSIS:   Severe Malnutrition related to acute illness as evidenced by percent weight loss, severe muscle depletion, moderate fat depletion, energy intake < or equal to 50% for > or equal to 5 days.  GOAL:   Patient will meet greater than or equal to 90% of their needs  Not meeting.  MONITOR:   PO intake, TF tolerance, Labs, Weight trends, Skin, I & O's  REASON FOR ASSESSMENT:   Consult Enteral/tube feeding initiation and management  ASSESSMENT:   80 y.o. male with medical history significant of myelodysplastic syndrome, CAD, chronic A. fib on anticoagulation, type II DM, HTN, HLD, MI, dementia, anemia and thrombocytopenia who is admitted with SDH and fall with sacral and T12 compression fractures.  12/22: small bore NGT placed -tip in gastric fundus  Pt disoriented x 4. Unable to provide any history. Pt's wife is at bedside, states pt was eating normally (2 meals daily with 1 snack) until he had a fall on 12/10. Pt began to not eat as much, tried drinking Ensure supplements. Would at most consume 1/2 bowl of oatmeal for breakfast and then would eat 1/2 bowl of soup for dinner. Over the past week pt has had nothing except for 2 fig bars. Pt has had difficulty chewing as well since his dentures do not fit as well anymore since losing weight.  Given recent intakes(or lack of), pt is at risk of refeeding syndrome.   Pt has been too lethargic and confused for SLP evaluation.  Has been placed on dysphagia 3 and per pt's wife and safety sitter pt has not wanted to eat any food. Took a few sips of sweet tea.   Per weight records, pt has lost 20 lbs since 11/1 (10% wt loss x 1.5 months, significant for time frame).   Medications: D5 infusion  Labs reviewed.  NUTRITION - FOCUSED PHYSICAL EXAM:  Flowsheet Row Most Recent Value  Orbital Region Moderate depletion  Upper Arm Region Severe depletion  Thoracic and Lumbar Region Unable to assess  Buccal Region Moderate depletion  Temple Region Severe depletion  Clavicle Bone Region Severe depletion  Clavicle and Acromion Bone Region Severe depletion  Scapular Bone Region Unable to assess  Dorsal Hand Moderate depletion  Patellar Region Unable to assess  Anterior Thigh Region Unable to assess  Posterior Calf Region Unable to assess  [compression socks]  Edema (RD Assessment) None  Hair Reviewed  Mouth Reviewed  [no teeth]       Diet Order:   Diet Order             DIET DYS 3 Room service appropriate? No; Fluid consistency: Thin  Diet effective now                   EDUCATION NEEDS:   Education needs have been addressed  Skin:  Skin Assessment: Reviewed RN Assessment (ecchymosis)  Last BM:  pta  Height:   Ht Readings from Last 1 Encounters:  01/13/21  5' 10.5" (1.791 m)    Weight:   Wt Readings from Last 1 Encounters:  01/14/21 75.9 kg    BMI:  Body mass index is 23.67 kg/m.  Estimated Nutritional Needs:   Kcal:  1900-2200kcal/day  Protein:  95-110g/day  Fluid:  1.9-2.2L/day  Clayton Bibles, MS, RD, LDN Inpatient Clinical Dietitian Contact information available via Amion

## 2021-01-15 NOTE — Progress Notes (Signed)
Progress Note    Jordan Clayton   WEX:[id number]  DOB: Mar 19, 1940  DOA: 01/13/2021     2 PCP: Leonard Downing, MD  Initial CC: elevated INR, fall at home   Hospital Course: Jordan Clayton is a 80 y.o. male with PMH MDS, CAD, chronic afib, DMII, HTN, HLD who presented after being found to have a supratherapeutic INR at outpatient lab work.  He was also noted to be slightly confused and having increased lethargy/sleepiness and his wife had also reported a recent fall at home. Initial CT head performed showed a subdural hematoma involving the right cerebral hemisphere as well as findings to also show underlying chronic subdural hematoma. Case was discussed with neurosurgery and he was recommended to undergo ongoing monitoring and repeat CT head the morning following admission. Head CT was repeated on 01/14/2021 which showed no significant change or expansion of the underlying subdural hematoma.  No significant mass-effect or midline shift appreciated.  From his prior fall earlier in December, he has a T12 compression fracture and subtle fracture involving the S4 sacral body.  He is managed outpatient by hematology/oncology for his MDS and undergoes blood transfusions as needed and also Aranesp injections.  Interval History:  No events overnight but this morning he is much more confused and less interactive.  Unable to follow commands or respond at all.  Sitter still bedside and wife present this morning. Her and I discussed trial of tube feeds given his prolonged n.p.o. from prior to admission.  Later in the afternoon, she also met with Dr. Lindi Adie; it was clear that patient continues to show signs of decline and small chance of meaningful recovery, therefore decision was made to transition to comfort care/hospice.  Assessment & Plan: * SDH (subdural hematoma)- (present on admission) - noted on 12/20 [facility]; repeat [place] on 12/21 shows stable SDH - d/c bedrest - okay to work with  PT/OT -In general this speaks to his ongoing failure to thrive with his recurrent falls and underlying compression fractures -Discussed with wife at bedside as well  MDS (myelodysplastic syndrome), high grade ([place])- (present on admission) - Follows up with oncology.  Dr. Lindi Adie aware patient in hospital - treated with Revlimid outpatient and Aranesp as needed - Hgb 7.2 g/dL on 12/21, given 1 unit PRBC; Hgb better this am - further conversations pending with oncology regarding next steps in treatment if any, depending on his clinical course currently; after discussion with oncology and wife on 12/22, decision made to transition to hospice  Protein-calorie malnutrition, severe - Patients BMI is Body mass index is 23.67 kg/m.. - Patient has the following signs/symptoms consistent with PCM: (fat loss, muscle loss, muscle wasting). - seen by RD, appreciate assistance. Continue plan per RD as well - after talking with patient's wife he has had prolonged poor intake almost 1 week now and no signs of fast improvement; due to this and her wanting to remain hopeful for improvement, we will give trial of TF at this time; if he improves enough for resuming eating independently then that's the hope/goal and if not, we will need further [place] discussions. Palliative care also following and discussing with wife, greatly appreciate help  -Shortly after this discussion, Dr. Lindi Adie met with patient's wife bedside and she elected for not pursuing further aggressive medical measures and instead transitioning to hospice/comfort care   Mild cognitive impairment- (present on admission) - See delirium as well  Delirium- (present on admission) - continue delirium precautions - moreso at  risk for encephalopathy from SDH, poor intake, underlying MDS treatment and overall failure to thrive recently; some possible contribution from morphine as well - d/c morphine - use tylenol for now for pain   Atrial fibrillation,  chronic (HCC)- (present on admission) - On Coumadin chronically; wife reports poor appetite lately and patient has not been eating; this may be contributing to his supratherapeutic level noted prior to admission -INR has been reversed on admission; down to 1.4 on 12/21 - Continue holding Coumadin - No further anticoagulation as we are now transitioning to comfort care  Failure to thrive in adult- (present on admission) Wife reports that ever since patient has been started on the Revlimid he has poor p.o. intake.  Which has worsened ever since he had a fall. Prognosis poor.   Leucocytosis- (present on admission) - Initially suspected due to reactive in nature however continues to have elevated bandemia (some possible confounding from MDS).  Given his decreased mentation, at risk for aspiration -Initially was going to order Unasyn however after discussion with oncology and patient's wife, we will now transition to comfort care  Goals of care, counseling/discussion - Poor oral intake recently with recurrent falls - GOC was discussed with wife on admission.  Patient currently partial code (no CPR or intubation, would want medical management) - after discussion with oncology and wife, will change to DNR/DNI and pursue hospice placement   Thrombocytopenia (HCC)- (present on admission) - due to MDS  Sacral fracture (HCC)- (present on admission) - continue pain/comfort measures   T12 compression fracture (HCC)- (present on admission) - s/p fall - continue lidocaine patch, heating pad, and pain control   Essential hypertension- (present on admission) - goal is comfort care now  Controlled type 2 diabetes mellitus without complication, without long-term current use of insulin (HCC) - Check A1c, 5.8%  Osteopenia- (present on admission) - comfort care  Anemia in other chronic diseases classified elsewhere- (present on admission) - see MDS   Chronic diastolic CHF (congestive heart  failure) (HCC)- (present on admission) - lasix on hold - s/p IVF on admission  - no s/s exac  Coronary artery disease- (present on admission) Seen on the CT scan.  Currently no indication for anticoagulation or antiplatelet therapy    Old records reviewed in assessment of this patient  Antimicrobials:   DVT prophylaxis: SCD  Code Status:   Code Status: DNR  Disposition Plan:   Status is: Inpatient  Objective: Blood pressure 136/65, pulse (!) 115, temperature 99.8 F (37.7 C), temperature source Axillary, resp. rate (!) 35, height 5' 10.5" (1.791 m), weight 75.9 kg, SpO2 94 %.  Examination:  Physical Exam Constitutional:      Comments: Chronically ill-appearing elderly gentleman lying in bed less interactive and unable to follow any commands  HENT:     Head: Normocephalic and atraumatic.     Mouth/Throat:     Mouth: Mucous membranes are moist.  Eyes:     Extraocular Movements: Extraocular movements intact.     Pupils: Pupils are equal, round, and reactive to light.  Cardiovascular:     Rate and Rhythm: Normal rate and regular rhythm.     Heart sounds: Normal heart sounds.  Pulmonary:     Effort: Respiratory distress present.     Comments: Tachypneic and using abdominal muscles Abdominal:     General: Bowel sounds are normal. There is no distension.     Palpations: Abdomen is soft.     Tenderness: There is no abdominal tenderness.  Musculoskeletal:        General: Normal range of motion.     Cervical back: Normal range of motion and neck supple.  Skin:    General: Skin is warm and dry.  Neurological:     Mental Status: He is disoriented.     Comments: Moving all 4 extremities spontaneously.  No longer following commands  Psychiatric:        Mood and Affect: Mood normal.        Behavior: Behavior normal.     Consultants:  Oncology Palliative care  Procedures:    Data Reviewed: I have personally reviewed labs and imaging studies    LOS: 2 days  Time  spent: Greater than 50% of the 35 minute visit was spent in counseling/coordination of care for the patient as laid out in the A&P.   Dwyane Dee, MD [facility] 01/15/2021, 2:56 PM

## 2021-01-15 NOTE — TOC Progression Note (Signed)
Transition of Care [facility]) - Progression Note    Patient Details  Name: Jordan Clayton MRN: [id number] Date of Birth: 1940/10/29  [facility]) CM/SW Contact  Purcell Mouton, RN Phone Number: 01/15/2021, 3:00 PM  Clinical Narrative:    Spoke with pt's wife Jordan Clayton concerning Hospice. Mrs. [employer] selected [facility]. Referral was given to in house rep S. D.,CSW.    Expected Discharge Plan: [place] Barriers to Discharge: No Barriers Identified  Expected Discharge Plan and Services Expected Discharge Plan: [place] Choice: Hospice Living arrangements for the past 2 months: Single Family Home                                       Social Determinants of Health (SDOH) Interventions    Readmission Risk Interventions No flowsheet data found.

## 2021-01-15 NOTE — Progress Notes (Signed)
°   01/15/21 1354  Assess: MEWS Score  Temp 99.8 F (37.7 C)  BP 136/65  Pulse Rate (!) 115  ECG Heart Rate (!) 105  Resp (!) 35  Level of Consciousness Alert  SpO2 94 %  O2 Device Nasal Cannula  Patient Activity (if Appropriate) In bed  O2 Flow Rate (L/min) 3 L/min  Assess: MEWS Score  MEWS Temp 0  MEWS Systolic 0  MEWS Pulse 1  MEWS RR 2  MEWS LOC 0  MEWS Score 3  MEWS Score Color Yellow  Assess: if the MEWS score is Yellow or Red  Were vital signs taken at a resting state? Yes  Focused Assessment No change from prior assessment  Does the patient meet 2 or more of the SIRS criteria? No  MEWS guidelines implemented *See Row Information* Yes  Treat  MEWS Interventions Administered scheduled meds/treatments  Pain Scale Faces  Pain Score 0  Faces Pain Scale 0  Breathing 0  Negative Vocalization 0  Facial Expression 1  Body Language 1  Consolability 0  PAINAD Score 2  Complains of Restless  Interventions Medication (see MAR)  Neuro symptoms relieved by Rest;Other (Comment) (prn Tylenol)  Take Vital Signs  Increase Vital Sign Frequency  Yellow: Q 2hr X 2 then Q 4hr X 2, if remains yellow, continue Q 4hrs  Escalate  MEWS: Escalate Yellow: discuss with charge nurse/RN and consider discussing with provider and RRT  Notify: Charge Nurse/RN  Name of Charge Nurse/RN Notified Annetta Maw RN  Date Charge Nurse/RN Notified 01/15/21  Time Charge Nurse/RN Notified 3086  Notify: Provider  Provider Name/Title Dr. Sabino Gasser  Date Provider Notified 01/15/21  Time Provider Notified [phone]  Notification Type Page  Notification Reason Other (Comment) (Yellow MEWS)  Provider response No new orders  Date of Provider Response 01/15/21  Time of Provider Response 1406  Notify: Rapid Response  Name of Rapid Response RN Notified Not applicable  Document  Patient Outcome Other (Comment) (Pt is stable, no chage in acuity, not in distress.)  Progress note created (see row info) Yes   Assess: SIRS CRITERIA  SIRS Temperature  0  SIRS Pulse 1  SIRS Respirations  1  SIRS WBC 1  SIRS Score Sum  3

## 2021-01-15 NOTE — Progress Notes (Signed)
HEMATOLOGY-ONCOLOGY PROGRESS NOTE  SUBJECTIVE: Patient continues to have mental status changes.  He is also struggling with his breathing.  He is using abdominal muscles for respiration.  His wife is at his bedside.  A Dobbhoff tube was just placed for nutrition.   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 minus   12/16/2020 Miscellaneous   Aranesp with Revlimid 2.5 mg daily 21 days on 7 days off     OBJECTIVE: REVIEW OF SYSTEMS:     Delirium.  Not answering any questions.  He does not open his eyes to touch and pain.   PHYSICAL EXAMINATION: ECOG PERFORMANCE STATUS: 4 - Bedbound  Vitals:   01/15/21 0516 01/15/21 1354  BP: 130/82 136/65  Pulse: 91 (!) 115  Resp:  (!) 35  Temp: 98.2 F (36.8 C) 99.8 F (37.7 C)  SpO2: 93% 94%   Filed Weights   01/13/21 1602 01/14/21 0500  Weight: 170 lb (77.1 kg) 167 lb 5.3 oz (75.9 kg)      LABORATORY DATA:  I have reviewed the data as listed CMP Latest Ref Rng & Units 01/15/2021 01/14/2021 01/13/2021  Glucose 70 - 99 mg/dL 200(H) 147(H) 120(H)  BUN 8 - 23 mg/dL 31(H) 28(H) 23  Creatinine 0.61 - 1.24 mg/dL 1.07 1.21 0.94  Sodium 135 - 145 mmol/L 139 137 135  Potassium 3.5 - 5.1 mmol/L 3.7 4.0 4.2  Chloride 98 - 111 mmol/L 107 103 101  CO2 22 - 32 mmol/L 22 22 25   Calcium 8.9 - 10.3 mg/dL 8.2(L) 8.5(L) 8.5(L)  Total Protein 6.5 - 8.1 g/dL 6.6 6.5 7.1  Total Bilirubin 0.3 - 1.2 mg/dL 4.4(H) 3.0(H) 1.8(H)  Alkaline Phos 38 - 126 U/L 82 91 111  AST 15 - 41 U/L 43(H) 34 27  ALT 0 - 44 U/L 31 27 28     Lab Results  Component Value Date   WBC 21.3 (H) 01/15/2021   HGB 8.5 (L) 01/15/2021   HCT 25.9 (L) 01/15/2021   MCV 94.5 01/15/2021   PLT 74 (L) 01/15/2021   NEUTROABS 13.6 (H) 01/15/2021    ASSESSMENT AND PLAN: 1.  Acute delirium status post subdural hematoma.  Also complicated by fractures in the bone requiring  pain medication. I discussed with the patient's wife about his frail health and that he is struggling to breathe and potentially has a very short life expectancy.  She is agreeable to proceed with hospice care. We will stop any medications or treatments or labs that are not necessary for comfort care. 2. DNRCC 3.  Patient's wife wishes for inpatient hospice care.  I suspect that he has very few weeks of life expectancy.

## 2021-01-15 NOTE — Progress Notes (Signed)
Manufacturing engineer [facility]) Hospital Liaison Note  Received request from Transitions of Care Manager Cookie  for family interest in [facility].   Approval for [facility] is determined by [facility] MD. Once [facility] MD has determined [facility] eligibility, [place] will update hospital staff and family.  Please do not hesitate to call with any hospice related questions.    Thank you for the opportunity to participate in this patient's care.  Daphene Calamity, MSW [facility] Liaison  [phone]

## 2021-01-15 NOTE — Progress Notes (Signed)
Patient now on comfort care.  Feeding tube discontinued per order.  Assisted in position of comfort.  Needs addressed.  Sitter at bedside.

## 2021-01-16 ENCOUNTER — Inpatient Hospital Stay: Payer: Medicare Other

## 2021-01-16 DIAGNOSIS — R627 Adult failure to thrive: Secondary | ICD-10-CM | POA: Diagnosis not present

## 2021-01-16 DIAGNOSIS — S065XAA Traumatic subdural hemorrhage with loss of consciousness status unknown, initial encounter: Secondary | ICD-10-CM | POA: Diagnosis not present

## 2021-01-16 LAB — BPAM RBC
Blood Product Expiration Date: 202301162359
Blood Product Expiration Date: 202301162359
ISSUE DATE / TIME: 202212211748
ISSUE DATE / TIME: 202212220123
Unit Type and Rh: 6200
Unit Type and Rh: 6200

## 2021-01-16 LAB — TYPE AND SCREEN
ABO/RH(D): A POS
ABO/RH(D): A POS
Antibody Screen: NEGATIVE
Antibody Screen: NEGATIVE
Unit division: 0
Unit division: 0

## 2021-01-16 NOTE — Care Management Important Message (Signed)
Important Message  Patient Details IM Letter placed in Patients room. Name: Jordan Clayton MRN: [id number] Date of Birth: [birth year]   Medicare Important Message Given:  Yes     Kerin Salen 01/16/2021, 1:45 PM

## 2021-01-16 NOTE — Discharge Summary (Signed)
Physician Discharge Summary   Jordan Clayton WEX:[id number] DOB: Sep 05, 1940 DOA: 01/13/2021  PCP: Leonard Downing, MD  Admit date: 01/13/2021 Discharge date:  01/16/2021  Admitted From: home Disposition:  [place] Discharging physician: Dwyane Dee, MD  Recommendations for Outpatient Follow-up:  Continue comfort care measures   Discharge Condition: poor CODE STATUS: DNR Diet recommendation:  Diet Orders (From admission, onward)     Start     Ordered   01/14/21 1552  DIET DYS 3 Room service appropriate? No; Fluid consistency: Thin  Diet effective now       Question Answer Comment  Room service appropriate? No   Fluid consistency: Thin      01/14/21 1552            Hospital Course: Jordan Clayton is a 80 y.o. male with PMH MDS, CAD, chronic afib, DMII, HTN, HLD who presented after being found to have a supratherapeutic INR at outpatient lab work.  He was also noted to be slightly confused and having increased lethargy/sleepiness and his wife had also reported a recent fall at home. Initial CT head performed showed a subdural hematoma involving the right cerebral hemisphere as well as findings to also show underlying chronic subdural hematoma. Case was discussed with neurosurgery and he was recommended to undergo ongoing monitoring and repeat CT head the morning following admission. Head CT was repeated on 01/14/2021 which showed no significant change or expansion of the underlying subdural hematoma.  No significant mass-effect or midline shift appreciated.  From his prior fall earlier in December, he has a T12 compression fracture and subtle fracture involving the S4 sacral body.  He is managed outpatient by hematology/oncology for his MDS and undergoes blood transfusions as needed and also Aranesp injections.  Due to worsening functional status and failure to thrive, GOC discussions were held and his wife elected to transition to comfort care with hospice. He  was accepted to [facility] on discharge.  Further problem based plan below while managed in hospital.   * SDH (subdural hematoma)- (present on admission) - noted on 12/20 [facility]; repeat [place] on 12/21 shows stable SDH - d/c bedrest - okay to work with PT/OT -In general this speaks to his ongoing failure to thrive with his recurrent falls and underlying compression fractures -Discussed with wife at bedside as well  MDS (myelodysplastic syndrome), high grade ([place])- (present on admission) - Follows up with oncology.  Dr. Lindi Adie aware patient in hospital - treated with Revlimid outpatient and Aranesp as needed - Hgb 7.2 g/dL on 12/21, given 1 unit PRBC; Hgb better this am - further conversations pending with oncology regarding next steps in treatment if any, depending on his clinical course currently; after discussion with oncology and wife on 12/22, decision made to transition to hospice  Protein-calorie malnutrition, severe - Patients BMI is Body mass index is 23.67 kg/m.. - Patient has the following signs/symptoms consistent with PCM: (fat loss, muscle loss, muscle wasting). - seen by RD, appreciate assistance. Continue plan per RD as well - after talking with patient's wife he has had prolonged poor intake almost 1 week now and no signs of fast improvement; due to this and her wanting to remain hopeful for improvement, we will give trial of TF at this time; if he improves enough for resuming eating independently then that's the hope/goal and if not, we will need further [place] discussions. Palliative care also following and discussing with wife, greatly appreciate help  -Shortly after this discussion,  Dr. Lindi Adie met with patient's wife bedside and she elected for not pursuing further aggressive medical measures and instead transitioning to hospice/comfort care   Mild cognitive impairment- (present on admission) - See delirium as well  Delirium- (present on admission) - continue delirium  precautions - moreso at risk for encephalopathy from SDH, poor intake, underlying MDS treatment and overall failure to thrive recently; some possible contribution from morphine as well  Atrial fibrillation, chronic ([place])- (present on admission) - On Coumadin chronically; wife reports poor appetite lately and patient has not been eating; this may be contributing to his supratherapeutic level noted prior to admission -INR has been reversed on admission; down to 1.4 on 12/21 - Continue holding Coumadin - No further anticoagulation as we are now transitioning to comfort care  Failure to thrive in adult- (present on admission) Wife reports that ever since patient has been started on the Revlimid he has poor p.o. intake.  Which has worsened ever since he had a fall. Prognosis poor.   Leucocytosis- (present on admission) - Initially suspected due to reactive in nature however continues to have elevated bandemia (some possible confounding from MDS).  Given his decreased mentation, at risk for aspiration -Initially was going to order Unasyn however after discussion with oncology and patient's wife, we will now transition to comfort care  Goals of care, counseling/discussion - Poor oral intake recently with recurrent falls - GOC was discussed with wife on admission.  Patient currently partial code (no CPR or intubation, would want medical management) - after discussion with oncology and wife, will change to DNR/DNI and pursue hospice placement   Thrombocytopenia ([place])- (present on admission) - due to MDS  Sacral fracture ([place])- (present on admission) - continue pain/comfort measures   T12 compression fracture ([place])- (present on admission) - s/p fall - continue comfort care   Essential hypertension- (present on admission) - goal is comfort care now  Controlled type 2 diabetes mellitus without complication, without long-term current use of insulin (HCC) - Check A1c, 5.8%  Osteopenia-  (present on admission) - comfort care  Anemia in other chronic diseases classified elsewhere- (present on admission) - see MDS   Chronic diastolic CHF (congestive heart failure) ([place])- (present on admission) - lasix on hold - s/p IVF on admission  - no s/s exac  Coronary artery disease- (present on admission) Seen on the CT scan.  Currently no indication for anticoagulation or antiplatelet therapy    Principal Diagnosis: SDH (subdural hematoma)  Discharge Diagnoses: Principal Problem:   SDH (subdural hematoma) Active Problems:   MDS (myelodysplastic syndrome), high grade (HCC)   Atrial fibrillation, chronic (HCC)   Delirium   Mild cognitive impairment   Protein-calorie malnutrition, severe   Goals of care, counseling/discussion   Leucocytosis   Failure to thrive in adult   Sacral fracture (HCC)   Thrombocytopenia (HCC)   Controlled type 2 diabetes mellitus without complication, without long-term current use of insulin (HCC)   Essential hypertension   T12 compression fracture (HCC)   Osteopenia   Coronary artery disease   Chronic diastolic CHF (congestive heart failure) ([place])   Anemia in other chronic diseases classified elsewhere    Allergies as of 01/16/2021       Reactions   Atorvastatin Other (See Comments)   unknown   Oxycodone-acetaminophen    REACTION: Hallucinations   Quetiapine    REACTION: disorientated   Rosuvastatin Other (See Comments)    unknown        Medication List  STOP taking these medications    amLODipine 5 MG tablet Commonly known as: NORVASC   [id number] PO   folic acid 863 MCG tablet Commonly known as: FOLVITE   furosemide 40 MG tablet Commonly known as: LASIX   HYDROcodone-acetaminophen 5-325 MG tablet Commonly known as: NORCO/VICODIN   lenalidomide 2.5 MG capsule Commonly known as: REVLIMID   metFORMIN 1000 MG tablet Commonly known as: GLUCOPHAGE   metoprolol tartrate 25 MG tablet Commonly known as: LOPRESSOR    multivitamin tablet   nitroGLYCERIN 0.4 MG SL tablet Commonly known as: NITROSTAT   pravastatin 40 MG tablet Commonly known as: PRAVACHOL   PRESERVISION AREDS 2 PO   quinapril 40 MG tablet Commonly known as: ACCUPRIL   thiamine 100 MG tablet Commonly known as: Vitamin B-1   vitamin B-12 250 MCG tablet Commonly known as: CYANOCOBALAMIN   warfarin 5 MG tablet Commonly known as: COUMADIN   warfarin 7.5 MG tablet Commonly known as: COUMADIN        Allergies  Allergen Reactions   Atorvastatin Other (See Comments)    unknown   Oxycodone-Acetaminophen     REACTION: Hallucinations   Quetiapine     REACTION: disorientated   Rosuvastatin Other (See Comments)     unknown    Consultations: Oncology  Discharge Exam: BP 118/65 (BP Location: Left Arm)    Pulse 98    Temp 98.2 F (36.8 C) (Oral)    Resp 14    Ht 5' 10.5" (1.791 m)    Wt 75.9 kg    SpO2 98%    BMI 23.67 kg/m  Physical Exam Constitutional:      Comments: Chronically ill-appearing elderly gentleman lying in bed less interactive and follows commands only intermittently  HENT:     Head: Normocephalic and atraumatic.     Mouth/Throat:     Mouth: Mucous membranes are moist.  Eyes:     Extraocular Movements: Extraocular movements intact.     Pupils: Pupils are equal, round, and reactive to light.  Cardiovascular:     Rate and Rhythm: Normal rate and regular rhythm.     Heart sounds: Normal heart sounds.  Pulmonary:     Effort: Pulmonary effort is normal. No respiratory distress.  Abdominal:     General: Bowel sounds are normal. There is no distension.     Palpations: Abdomen is soft.     Tenderness: There is no abdominal tenderness.  Musculoskeletal:        General: Normal range of motion.     Cervical back: Normal range of motion and neck supple.  Skin:    General: Skin is warm and dry.  Neurological:     Mental Status: He is disoriented.     Comments: Moving all 4 extremities spontaneously.   Follows some commands intermittently   Psychiatric:        Mood and Affect: Mood normal.        Behavior: Behavior normal.     The results of significant diagnostics from this hospitalization (including imaging, microbiology, ancillary and laboratory) are listed below for reference.   Microbiology: Recent Results (from the past 240 hour(s))  Resp Panel by RT-PCR (Flu A&B, Covid) Nasopharyngeal Swab     Status: None   Collection Time: 01/13/21  1:05 PM   Specimen: Nasopharyngeal Swab; Nasopharyngeal(NP) swabs in vial transport medium  Result Value Ref Range Status   SARS Coronavirus 2 by RT PCR NEGATIVE NEGATIVE Final    Comment: (NOTE) SARS-CoV-2 target nucleic acids are  NOT DETECTED.  The SARS-CoV-2 RNA is generally detectable in upper respiratory specimens during the acute phase of infection. The lowest concentration of SARS-CoV-2 viral copies this assay can detect is 138 copies/mL. A negative result does not preclude SARS-Cov-2 infection and should not be used as the sole basis for treatment or other patient management decisions. A negative result may occur with  improper specimen collection/handling, submission of specimen other than nasopharyngeal swab, presence of viral mutation(s) within the areas targeted by this assay, and inadequate number of viral copies(<138 copies/mL). A negative result must be combined with clinical observations, patient history, and epidemiological information. The expected result is Negative.  Fact Sheet for Patients:  EntrepreneurPulse.com.au  Fact Sheet for Healthcare Providers:  IncredibleEmployment.be  This test is no t yet approved or cleared by the [place] FDA and  has been authorized for detection and/or diagnosis of SARS-CoV-2 by FDA under an Emergency Use Authorization (EUA). This EUA will remain  in effect (meaning this test can be used) for the duration of the COVID-19 declaration under  Section 564(b)(1) of the Act, 21 U.S.C.section 360bbb-3(b)(1), unless the authorization is terminated  or revoked sooner.       Influenza A by PCR NEGATIVE NEGATIVE Final   Influenza B by PCR NEGATIVE NEGATIVE Final    Comment: (NOTE) The Xpert Xpress SARS-CoV-2/FLU/RSV plus assay is intended as an aid in the diagnosis of influenza from Nasopharyngeal swab specimens and should not be used as a sole basis for treatment. Nasal washings and aspirates are unacceptable for Xpert Xpress SARS-CoV-2/FLU/RSV testing.  Fact Sheet for Patients: EntrepreneurPulse.com.au  Fact Sheet for Healthcare Providers: IncredibleEmployment.be  This test is not yet approved or cleared by the [place] FDA and has been authorized for detection and/or diagnosis of SARS-CoV-2 by FDA under an Emergency Use Authorization (EUA). This EUA will remain in effect (meaning this test can be used) for the duration of the COVID-19 declaration under Section 564(b)(1) of the Act, 21 U.S.C. section 360bbb-3(b)(1), unless the authorization is terminated or revoked.  Performed at [facility], [place] [address] [place], [place] [address]      Labs: BNP (last 3 results) No results for input(s): BNP in the last 8760 hours. Basic Metabolic Panel: Recent Labs  Lab 01/13/21 1239 01/14/21 0715 01/15/21 0930  NA 135 137 139  K 4.2 4.0 3.7  CL 101 103 107  CO2 _0 GLUCOSE 120* 147* 200*  BUN 23 28* 31*  CREATININE 0.94 1.21 1.07  CALCIUM 8.5* 8.5* 8.2*  MG  --  1.7 1.9   Liver Function Tests: Recent Labs  Lab 01/13/21 1239 01/14/21 0715 01/15/21 0930  AST 27 34 43*  ALT _1 ALKPHOS 111 91 82  BILITOT 1.8* 3.0* 4.4*  PROT 7.1 6.5 6.6  ALBUMIN 3.3* 2.8* 2.9*   No results for input(s): LIPASE, AMYLASE in the last 168 hours. No results for input(s): AMMONIA in the last 168 hours. CBC: Recent Labs  Lab 01/13/21 1023 01/14/21 0715  01/15/21 0930  WBC 12.8* 15.8* 21.3*  NEUTROABS 8.7* 9.7* 13.6*  HGB 7.7* 7.2* 8.5*  HCT 24.2* 22.0* 25.9*  MCV 97.2 97.8 94.5  PLT 77* 69* 74*   Cardiac Enzymes: No results for input(s): CKTOTAL, CKMB, CKMBINDEX, TROPONINI in the last 168 hours. BNP: Invalid input(s): POCBNP CBG: Recent Labs  Lab 01/13/21 1325  GLUCAP 108*   D-Dimer No results for input(s): DDIMER in the last 72 hours. Hgb A1c Recent  Labs    01/14/21 0715  HGBA1C 5.8*   Lipid Profile No results for input(s): CHOL, HDL, LDLCALC, TRIG, CHOLHDL, LDLDIRECT in the last 72 hours. Thyroid function studies Recent Labs    01/14/21 0715  TSH 0.013*   Anemia work up No results for input(s): VITAMINB12, FOLATE, FERRITIN, TIBC, IRON, RETICCTPCT in the last 72 hours. Urinalysis    Component Value Date/Time   COLORURINE YELLOW 01/13/2021 1953   APPEARANCEUR HAZY (A) 01/13/2021 1953   LABSPEC 1.019 01/13/2021 1953   PHURINE 5.0 01/13/2021 1953   GLUCOSEU NEGATIVE 01/13/2021 1953   HGBUR MODERATE (A) 01/13/2021 1953   BILIRUBINUR NEGATIVE 01/13/2021 1953   KETONESUR 20 (A) 01/13/2021 1953   PROTEINUR 30 (A) 01/13/2021 1953   UROBILINOGEN 1.0 07/31/2009 0949   NITRITE POSITIVE (A) 01/13/2021 1953   LEUKOCYTESUR MODERATE (A) 01/13/2021 1953   Sepsis Labs Invalid input(s): PROCALCITONIN,  WBC,  LACTICIDVEN Microbiology Recent Results (from the past 240 hour(s))  Resp Panel by RT-PCR (Flu A&B, Covid) Nasopharyngeal Swab     Status: None   Collection Time: 01/13/21  1:05 PM   Specimen: Nasopharyngeal Swab; Nasopharyngeal(NP) swabs in vial transport medium  Result Value Ref Range Status   SARS Coronavirus 2 by RT PCR NEGATIVE NEGATIVE Final    Comment: (NOTE) SARS-CoV-2 target nucleic acids are NOT DETECTED.  The SARS-CoV-2 RNA is generally detectable in upper respiratory specimens during the acute phase of infection. The lowest concentration of SARS-CoV-2 viral copies this assay can detect is 138  copies/mL. A negative result does not preclude SARS-Cov-2 infection and should not be used as the sole basis for treatment or other patient management decisions. A negative result may occur with  improper specimen collection/handling, submission of specimen other than nasopharyngeal swab, presence of viral mutation(s) within the areas targeted by this assay, and inadequate number of viral copies(<138 copies/mL). A negative result must be combined with clinical observations, patient history, and epidemiological information. The expected result is Negative.  Fact Sheet for Patients:  EntrepreneurPulse.com.au  Fact Sheet for Healthcare Providers:  IncredibleEmployment.be  This test is no t yet approved or cleared by the [place] FDA and  has been authorized for detection and/or diagnosis of SARS-CoV-2 by FDA under an Emergency Use Authorization (EUA). This EUA will remain  in effect (meaning this test can be used) for the duration of the COVID-19 declaration under Section 564(b)(1) of the Act, 21 U.S.C.section 360bbb-3(b)(1), unless the authorization is terminated  or revoked sooner.       Influenza A by PCR NEGATIVE NEGATIVE Final   Influenza B by PCR NEGATIVE NEGATIVE Final    Comment: (NOTE) The Xpert Xpress SARS-CoV-2/FLU/RSV plus assay is intended as an aid in the diagnosis of influenza from Nasopharyngeal swab specimens and should not be used as a sole basis for treatment. Nasal washings and aspirates are unacceptable for Xpert Xpress SARS-CoV-2/FLU/RSV testing.  Fact Sheet for Patients: EntrepreneurPulse.com.au  Fact Sheet for Healthcare Providers: IncredibleEmployment.be  This test is not yet approved or cleared by the [place] FDA and has been authorized for detection and/or diagnosis of SARS-CoV-2 by FDA under an Emergency Use Authorization (EUA). This EUA will remain in effect (meaning  this test can be used) for the duration of the COVID-19 declaration under Section 564(b)(1) of the Act, 21 U.S.C. section 360bbb-3(b)(1), unless the authorization is terminated or revoked.  Performed at [facility], [place] [address], [place], [place] [address]     Procedures/Studies: DG Lumbar Spine Complete  Result Date: 01/05/2021 CLINICAL DATA:  Fall. EXAM: LUMBAR SPINE - COMPLETE 4+ VIEW COMPARISON:  chest x-ray 01/15/2018 FINDINGS: Bones are diffusely demineralized. Age indeterminate mild compression deformity noted at T12, new since 01/15/2018. Mild loss of intervertebral disc height noted at L2-3. SI joints unremarkable. Atherosclerotic calcification noted abdominal aorta. IMPRESSION: Age indeterminate mild compression deformity at T12 new since 2019. CT or MRI could be used to further evaluate as clinically warranted. Aortic Atherosclerois (ICD10-170.0) Electronically Signed   By: Misty Stanley M.D.   On: 01/05/2021 08:36   CT HEAD WO CONTRAST  Result Date: 01/14/2021 CLINICAL DATA:  Subdural hemorrhage follow-up EXAM: CT HEAD WITHOUT CONTRAST TECHNIQUE: Contiguous axial images were obtained from the base of the skull through the vertex without intravenous contrast. COMPARISON:  01/13/2021 FINDINGS: Examination is somewhat limited by patient motion artifact, including on repeat acquisition. Brain: No evidence of acute infarction, hydrocephalus, extra-axial collection or mass lesion/mass effect. Unchanged mixed attenuation right hemispheric subdural hematoma, with hyperdense, acute appearing components inferiorly and posteriorly (series 2, image 11). Maximum component thickness 0.9 cm (series 2, image 11). Underlying advanced periventricular and deep white matter hypodensity. Vascular: No hyperdense vessel or unexpected calcification. Skull: Normal. Negative for fracture or focal lesion. Sinuses/Orbits: No acute finding. Other: None. IMPRESSION: 1. Examination is somewhat  limited by patient motion artifact, including on repeat acquisition. 2. Within this limitation, there is an unchanged mixed attenuation right hemispheric subdural hematoma, with hyperdense, acute appearing components inferiorly and posteriorly. Maximum component thickness 0.9 cm. No significant mass effect. 3. Underlying advanced small-vessel white matter disease. Electronically Signed   By: Delanna Ahmadi M.D.   On: 01/14/2021 10:41   CT HEAD WO CONTRAST (5MM)  Result Date: 01/13/2021 CLINICAL DATA:  Headache EXAM: CT HEAD WITHOUT CONTRAST TECHNIQUE: Contiguous axial images were obtained from the base of the skull through the vertex without intravenous contrast. COMPARISON:  Head CT 07/01/2017 FINDINGS: Brain: There is a mixed density right cerebral convexity subdural hematoma with acute blood products seen over the right frontal lobe and layering along the falx and right tentorial leaflet. Acute blood products overlying the right temporal lobe posteriorly measure up to 9 mm in the coronal plane. The acute blood overlying the right frontal lobe measures up to 8 mm along the roof of the right orbit. There is a layering hematocrit level anteriorly The collection exerts mild mass effect on the underlying brain parenchyma with no midline shift. There is no acute extra-axial collection on the left. There is no evidence of acute territorial infarct. There is a background of mild global parenchymal volume loss. The ventricles are normal in size. Patchy hypodensity throughout the subcortical and periventricular white matter likely reflects sequela of chronic white matter microangiopathy. There is no solid mass lesion. Vascular: There is calcification of the bilateral cavernous ICAs and vertebral arteries. Skull: Normal. Negative for fracture or focal lesion. Sinuses/Orbits: There is moderate mucosal thickening in the right sphenoid sinus with aerated fluid. The globes and orbits are unremarkable. Other: None.  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 The hematoma exerts mild mass effect on the underlying brain parenchyma with no midline shift. These results were called by telephone at the time of interpretation on 01/13/2021 at 2:04 pm to provider Sherwood Gambler , who verbally acknowledged these results. Electronically Signed   By: Valetta Mole M.D.   On: 01/13/2021 14:07  CT THORACIC SPINE WO CONTRAST  Result Date: 01/05/2021 CLINICAL DATA:  80 year old male with fall and back pain. Age indeterminate T12 compression fracture on radiographs. EXAM: CT THORACIC SPINE WITHOUT CONTRAST TECHNIQUE: Multidetector CT images of the thoracic were obtained using the standard protocol without intravenous contrast. COMPARISON:  Lumbar radiographs today. FINDINGS: Limited cervical spine imaging: Cervicothoracic junction alignment is within normal limits. Lower cervical disc, endplate and facet degeneration. Thoracic spine segmentation:  Appears to be normal. Alignment: Mildly exaggerated thoracic kyphosis. No spondylolisthesis. Vertebrae: Osteopenia. Thoracic vertebrae T1 through T9 appear intact. Subtle T10 inferior endplate irregularity is probably degenerative and chronic. Similar appearance at T11. T12 vertebral body loss of height up to 25% with un healed inferior endplate fracture (sagittal image 25 and coronal image 41). Difficult to exclude recent superior endplate compression also. But no retropulsion. The T12 pedicles and posterior elements appear intact. Visible posterior ribs are intact. Paraspinal and other soft tissues: Major airways are patent. Respiratory motion and dependent pulmonary atelectasis with small or trace layering simple fluid density left pleural effusion. Calcified aortic atherosclerosis. Calcified coronary artery atherosclerosis. Small  calcified bilateral hilar lymph nodes, postinflammatory. No evidence of pericardial effusion. Negative visible noncontrast upper abdominal viscera. Thoracic paraspinal soft tissues remain normal. Disc levels: Mild for age thoracic spine degeneration. No CT evidence of thoracic spinal stenosis. IMPRESSION: 1. Osteopenia. Un-healed T12 compression fracture with loss of height up to 25%. No retropulsion or complicating features. If specific therapy is desired, Thoracic MRI without contrast or Nuclear Medicine Whole-body Bone Scan would best confirm candidacy for vertebroplasty. 2. No other acute osseous abnormality in the thoracic spine. Mild for age thoracic spine degeneration. 3. Trace left pleural effusion. Calcified coronary artery and Aortic Atherosclerosis (ICD10-I70.0). Electronically Signed   By: Genevie Ann M.D.   On: 01/05/2021 10:31   CT Lumbar Spine Wo Contrast  Result Date: 01/05/2021 CLINICAL DATA:  80 year old male with fall and back pain. Age indeterminate T12 compression fracture on radiographs. EXAM: CT LUMBAR SPINE WITHOUT CONTRAST TECHNIQUE: Multidetector CT imaging of the lumbar spine was performed without intravenous contrast administration. Multiplanar CT image reconstructions were also generated. COMPARISON:  Lumbar radiographs and thoracic spine CT today reported separately. FINDINGS: Segmentation: Normal, concordant with the thoracic spine numbering today. Alignment: Mild straightening of lumbar lordosis. No spondylolisthesis. Vertebrae: T12 is reported separately. Osteopenia. Unfused L1 transverse process ossification centers (normal variant). Intact lumbar vertebrae. Subtle ventral cortical defect in the lower sacrum at the S4 segment. See sagittal image 35. Elsewhere, the visible sacrum and SI joints appear intact. Paraspinal and other soft tissues: Aortoiliac calcified atherosclerosis. Normal caliber abdominal aorta. There is trace free fluid in the pelvis on series 11, image 140. Simple  fluid density. Otherwise negative visible noncontrast abdominal and pelvic viscera. Lumbar paraspinal soft tissues are within normal limits. 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 Disc degeneration maximal at L2-L3. Facet degeneration maximal at L4-L5. No CT evidence of lumbar spinal stenosis. IMPRESSION: 1. Osteopenia. Subtle fracture of the ventral S4 sacral body. This is age indeterminate by CT but suspicious for acute fracture (see #3). Sacral ala and SI joints appear intact. 2. No acute osseous abnormality in the lumbar spine. 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 3. Trace free fluid in the pelvis, nonspecific but perhaps secondary to #1. 4. Aortic Atherosclerosis (ICD10-I70.0). Electronically Signed   By: Genevie Ann M.D.   On: 01/05/2021 10:36   DG Abd Portable 1V  Result Date: 01/15/2021 CLINICAL DATA:  Feeding tube  placement EXAM: PORTABLE ABDOMEN - 1 VIEW COMPARISON:  08/12/2009 FINDINGS: Feeding tube with weighted tip in the gastric fundus. Stylet remains. External pad noted which limits evaluation of the bowel. IMPRESSION: Feeding tube with tip in the gastric fundus. Electronically Signed   By: Suzy Bouchard M.D.   On: 01/15/2021 12:39   DG Hips Bilat W or Wo Pelvis 3-4 Views  Result Date: 01/05/2021 CLINICAL DATA:  Sat on the arm of a chair and fell on Saturday, low back and RIGHT hip pain EXAM: DG HIP (WITH OR WITHOUT PELVIS) 3-4V BILAT COMPARISON:  None FINDINGS: Osseous demineralization. SI joint spaces preserved. Mild narrowing of the hip joints bilaterally. No acute fracture, dislocation, or bone destruction. Scattered atherosclerotic calcifications. IMPRESSION: No acute osseous abnormalities. 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 Electronically Signed   By: Lavonia Dana M.D.   On: 01/05/2021 08:35     Time coordinating discharge: Over 30 minutes    Dwyane Dee, MD  [facility] 01/16/2021, 11:13 AM

## 2021-01-16 NOTE — Progress Notes (Signed)
Nutrition Brief Note  Chart reviewed. Pt now transitioning to comfort care.  No further nutrition interventions planned at this time.   Dimples Probus, MS, RD, LDN Inpatient Clinical Dietitian Contact information available via Amion   

## 2021-01-16 NOTE — Progress Notes (Signed)
RN provided handoff report to receiving RN, Rise Paganini, at [employer].

## 2021-01-16 NOTE — Progress Notes (Addendum)
[facility] [address] [facility]) Hospital Liaison Note  Consents are being scheduled with [name] staff.   [facility] will be notified of patient D/C and transport arranged by [facility] or [facility] staff. TOC/RN/Attending Physician/family/ACC staff also notified of D.C plan.   Please send signed DNR form with patient and RN call report to [phone].   Addendum Transport is arranged, they are expecting pick up between 4 and 5 pm  Daphene Calamity, MSW [facility] Liaison [phone]

## 2021-01-16 NOTE — TOC Transition Note (Signed)
Transition of Care [facility]) - CM/SW Discharge Note   Patient Details  Name: Jordan Clayton MRN: [id number] Date of Birth: [birth year]  Transition of Care [facility]) CM/SW Contact:  Joaquin Courts, RN Phone Number: 01/16/2021, 2:29 PM   Clinical Narrative:    Planned discharge to [facility] today,  [facility] rep arranged for [facility] transport.   Final next level of care: [place] Barriers to Discharge: No Barriers Identified   Patient Goals and CMS Choice Patient states their goals for this hospitalization and ongoing recovery are:: AMS   Choice offered to / list presented to : Spouse  Discharge Placement                       Discharge Plan and Services     Post Acute Care Choice: Hospice                               Social Determinants of Health (SDOH) Interventions     Readmission Risk Interventions No flowsheet data found.

## 2021-01-22 ENCOUNTER — Other Ambulatory Visit: Payer: Self-pay | Admitting: *Deleted

## 2021-01-22 NOTE — Telephone Encounter (Signed)
[facility] order faxed to [employer]. [phone] Auth# [id number]

## 2021-01-25 DEATH — deceased

## 2021-02-02 ENCOUNTER — Encounter: Payer: Self-pay | Admitting: Hematology and Oncology

## 2021-02-09 ENCOUNTER — Inpatient Hospital Stay: Payer: Medicare Other

## 2021-02-09 ENCOUNTER — Inpatient Hospital Stay: Payer: Medicare Other | Admitting: Hematology and Oncology
